# Patient Record
Sex: Male | Born: 1960
Health system: Southern US, Community
[De-identification: ages and names within clinical notes are randomized; demographics above are authoritative.]

## PROBLEM LIST (undated history)

## (undated) ENCOUNTER — Emergency Department (HOSPITAL_BASED_OUTPATIENT_CLINIC_OR_DEPARTMENT_OTHER): Admission: EM | Payer: Self-pay | Source: Home / Self Care

## (undated) DIAGNOSIS — K859 Acute pancreatitis without necrosis or infection, unspecified: Secondary | ICD-10-CM

## (undated) DIAGNOSIS — K219 Gastro-esophageal reflux disease without esophagitis: Secondary | ICD-10-CM

## (undated) DIAGNOSIS — I10 Essential (primary) hypertension: Secondary | ICD-10-CM

## (undated) DIAGNOSIS — Z973 Presence of spectacles and contact lenses: Secondary | ICD-10-CM

## (undated) DIAGNOSIS — C439 Malignant melanoma of skin, unspecified: Secondary | ICD-10-CM

## (undated) DIAGNOSIS — N2889 Other specified disorders of kidney and ureter: Secondary | ICD-10-CM

## (undated) DIAGNOSIS — N189 Chronic kidney disease, unspecified: Secondary | ICD-10-CM

## (undated) DIAGNOSIS — C649 Malignant neoplasm of unspecified kidney, except renal pelvis: Secondary | ICD-10-CM

## (undated) DIAGNOSIS — M1990 Unspecified osteoarthritis, unspecified site: Secondary | ICD-10-CM

## (undated) HISTORY — DX: Acute pancreatitis without necrosis or infection, unspecified: K85.90

## (undated) HISTORY — PX: COLONOSCOPY: SHX174

## (undated) HISTORY — PX: OTHER SURGICAL HISTORY: SHX169

---

## 1997-11-16 ENCOUNTER — Encounter: Payer: Self-pay | Admitting: Emergency Medicine

## 1997-11-16 ENCOUNTER — Emergency Department (HOSPITAL_COMMUNITY): Admission: EM | Admit: 1997-11-16 | Discharge: 1997-11-16 | Payer: Self-pay | Admitting: Emergency Medicine

## 1998-05-31 ENCOUNTER — Ambulatory Visit (HOSPITAL_COMMUNITY): Admission: RE | Admit: 1998-05-31 | Discharge: 1998-05-31 | Payer: Self-pay | Admitting: Internal Medicine

## 1998-05-31 ENCOUNTER — Encounter: Payer: Self-pay | Admitting: Internal Medicine

## 1998-11-06 ENCOUNTER — Ambulatory Visit (HOSPITAL_COMMUNITY): Admission: RE | Admit: 1998-11-06 | Discharge: 1998-11-06 | Payer: Self-pay | Admitting: Gastroenterology

## 2000-07-08 ENCOUNTER — Encounter: Payer: Self-pay | Admitting: Surgery

## 2000-07-08 ENCOUNTER — Ambulatory Visit (HOSPITAL_COMMUNITY): Admission: RE | Admit: 2000-07-08 | Discharge: 2000-07-08 | Payer: Self-pay | Admitting: Surgery

## 2000-07-15 ENCOUNTER — Encounter: Payer: Self-pay | Admitting: Surgery

## 2000-07-15 ENCOUNTER — Ambulatory Visit (HOSPITAL_BASED_OUTPATIENT_CLINIC_OR_DEPARTMENT_OTHER): Admission: RE | Admit: 2000-07-15 | Discharge: 2000-07-15 | Payer: Self-pay | Admitting: Surgery

## 2002-10-05 ENCOUNTER — Ambulatory Visit (HOSPITAL_COMMUNITY): Admission: RE | Admit: 2002-10-05 | Discharge: 2002-10-05 | Payer: Self-pay | Admitting: Internal Medicine

## 2002-10-05 ENCOUNTER — Encounter: Payer: Self-pay | Admitting: Internal Medicine

## 2003-12-25 ENCOUNTER — Emergency Department (HOSPITAL_COMMUNITY): Admission: EM | Admit: 2003-12-25 | Discharge: 2003-12-25 | Payer: Self-pay | Admitting: Emergency Medicine

## 2004-08-08 ENCOUNTER — Emergency Department (HOSPITAL_COMMUNITY): Admission: EM | Admit: 2004-08-08 | Discharge: 2004-08-08 | Payer: Self-pay | Admitting: Emergency Medicine

## 2011-02-21 ENCOUNTER — Emergency Department (INDEPENDENT_AMBULATORY_CARE_PROVIDER_SITE_OTHER)
Admission: EM | Admit: 2011-02-21 | Discharge: 2011-02-21 | Disposition: A | Discharge status: Home / Self Care | Payer: Self-pay | Source: Home / Self Care | Attending: Family Medicine | Admitting: Family Medicine

## 2011-02-21 ENCOUNTER — Emergency Department (INDEPENDENT_AMBULATORY_CARE_PROVIDER_SITE_OTHER): Payer: Self-pay

## 2011-02-21 DIAGNOSIS — M19041 Primary osteoarthritis, right hand: Secondary | ICD-10-CM

## 2011-02-21 DIAGNOSIS — M19049 Primary osteoarthritis, unspecified hand: Secondary | ICD-10-CM

## 2011-02-21 HISTORY — DX: Gastro-esophageal reflux disease without esophagitis: K21.9

## 2011-02-21 HISTORY — DX: Malignant melanoma of skin, unspecified: C43.9

## 2011-02-21 LAB — URIC ACID: Uric Acid, Serum: 7.6 mg/dL (ref 4.0–7.8)

## 2011-02-21 LAB — POCT I-STAT, CHEM 8
BUN: 5 mg/dL — ABNORMAL LOW (ref 6–23)
Calcium, Ion: 1.14 mmol/L (ref 1.12–1.32)
Chloride: 106 mEq/L (ref 96–112)
Creatinine, Ser: 0.8 mg/dL (ref 0.50–1.35)
Glucose, Bld: 131 mg/dL — ABNORMAL HIGH (ref 70–99)
HCT: 48 % (ref 39.0–52.0)
Hemoglobin: 16.3 g/dL (ref 13.0–17.0)
Potassium: 3.9 mEq/L (ref 3.5–5.1)
Sodium: 137 mEq/L (ref 135–145)
TCO2: 21 mmol/L (ref 0–100)

## 2011-02-21 LAB — CBC
HCT: 42.2 % (ref 39.0–52.0)
Hemoglobin: 15.2 g/dL (ref 13.0–17.0)
MCH: 32.3 pg (ref 26.0–34.0)
MCHC: 36 g/dL (ref 30.0–36.0)
MCV: 89.6 fL (ref 78.0–100.0)
Platelets: 202 10*3/uL (ref 150–400)
RBC: 4.71 MIL/uL (ref 4.22–5.81)
RDW: 12.6 % (ref 11.5–15.5)
WBC: 9.7 10*3/uL (ref 4.0–10.5)

## 2011-02-21 LAB — DIFFERENTIAL
Basophils Absolute: 0 10*3/uL (ref 0.0–0.1)
Basophils Relative: 0 % (ref 0–1)
Eosinophils Absolute: 0 10*3/uL (ref 0.0–0.7)
Eosinophils Relative: 0 % (ref 0–5)
Lymphocytes Relative: 14 % (ref 12–46)
Lymphs Abs: 1.3 10*3/uL (ref 0.7–4.0)
Monocytes Absolute: 1.1 10*3/uL — ABNORMAL HIGH (ref 0.1–1.0)
Monocytes Relative: 12 % (ref 3–12)
Neutro Abs: 7.2 10*3/uL (ref 1.7–7.7)
Neutrophils Relative %: 75 % (ref 43–77)

## 2011-02-21 MED ORDER — HYDROCODONE-ACETAMINOPHEN 5-325 MG PO TABS
1.0000 | ORAL_TABLET | Freq: Once | ORAL | Status: AC
  Administered 2011-02-21: 1 via ORAL

## 2011-02-21 MED ORDER — INDOMETHACIN 50 MG PO CAPS: 50.0000 mg | ORAL_CAPSULE | Freq: Two times a day (BID) | ORAL | Status: AC

## 2011-02-21 MED ORDER — HYDROCODONE-ACETAMINOPHEN 5-500 MG PO TABS: 1.0000 | ORAL_TABLET | Freq: Three times a day (TID) | ORAL | Status: AC | PRN

## 2011-02-21 MED ORDER — OMEPRAZOLE 20 MG PO CPDR: 20.0000 mg | DELAYED_RELEASE_CAPSULE | Freq: Every day | ORAL | Status: DC

## 2011-02-21 MED ORDER — PREDNISONE 20 MG PO TABS: ORAL_TABLET | ORAL | Status: AC

## 2011-02-21 MED ORDER — HYDROCODONE-ACETAMINOPHEN 5-325 MG PO TABS
ORAL_TABLET | ORAL | Status: AC
  Filled 2011-02-21: qty 1

## 2011-02-21 NOTE — ED Notes (Signed)
C/o pain and swelling to rt hand for 3 days.  Redness started yesterday.  Denies injury.

## 2011-02-21 NOTE — ED Provider Notes (Signed)
History     CSN: [id number]  Arrival date & time 02/21/11  1107   First MD Initiated Contact with Patient 02/21/11 1209      Chief Complaint  Patient presents with  . Hand Problem    (Consider location/radiation/quality/duration/timing/severity/associated sxs/prior treatment) HPI Comments: 50 y/o right handed male with h/o GERD here c/o constant right hand pain at dorsum of wrist and at base on 1st MP joint. Woke up 3 days ago with swelling, erythema and pain. Unable to move hand. 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 No discoloration of fingers, no coldness, weakness or numbness.   Past Medical History  Diagnosis Date  . GERD (gastroesophageal reflux disease)   . Melanoma     History reviewed. No pertinent past surgical history.  No family history on file.  History  Substance Use Topics  . Smoking status: Former Games developer  . Smokeless tobacco: Not on file  . Alcohol Use: Yes     social      Review of Systems  Constitutional: Negative.  Negative for fever and chills.  Musculoskeletal: Positive for joint swelling and arthralgias. Negative for myalgias.  Skin: Negative for wound.       Right wrist erythema and swelling at dorsum  Neurological: Negative for weakness and numbness.    Allergies  Sulfa antibiotics  Home Medications   Current Outpatient Rx  Name Route Sig Dispense Refill  . IBUPROFEN IB PO Oral Take by mouth as needed.      [name] PRILOSEC PO Oral Take by mouth daily.      [name] HYDROCODONE-ACETAMINOPHEN 5-500 MG PO TABS Oral Take 1-2 tablets by mouth every 8 (eight) hours as needed for pain. 10 tablet 0  . INDOMETHACIN 50 MG PO CAPS Oral Take 1 capsule (50 mg total) by mouth 2 (two) times daily with a meal. 14 capsule 0  . OMEPRAZOLE 20 MG PO CPDR Oral Take 1 capsule (20 mg total) by mouth daily. 20 capsule 0  . PREDNISONE 20 MG PO TABS  2 tabs po daily for 5 days 10 tablet no    BP 177/98  Pulse 94   Temp(Src) 99.7 F (37.6 C) (Oral)  Resp 18  SpO2 98%  Physical Exam  Nursing note and vitals reviewed. Constitutional: He is oriented to person, place, and time. He appears well-developed and well-nourished. No distress.  HENT:  Head: Normocephalic and atraumatic.  Cardiovascular: Normal heart sounds.   Pulmonary/Chest: Breath sounds normal.  Musculoskeletal:       Right hand: Mild erythema and swelling of wrist more in dorsum with diffuse tenderness to palpation. Passive and active limited flexion and extension of wrist due to reported pain.  Focal tenderness at 1st MP joint. Also enlarged tender 2nd and 3rd MP joints.  Patient able to adduct and abduct fingers against resistance but with reported discomfort can also make a  fist. Radial and ulnar pulses intact. Entire right extremity appear to have intact superficial sensation in right extremity including hand palm and dorsally. No distal cyanosis. No fluctuations, thickness of the skin or signs of infections, also no skin laceration or abrasions.  Neurological: He is alert and oriented to person, place, and time.  Skin: No rash noted.  Psychiatric:       Appears anxious, nail biting noted.    ED Course  Procedures (including critical care time)  Labs Reviewed  DIFFERENTIAL - Abnormal; Notable for the following:  Monocytes Absolute 1.1 (*)    All other components within normal limits  POCT I-STAT, CHEM 8 - Abnormal; Notable for the following:    BUN 5 (*)    Glucose, Bld 131 (*)    All other components within normal limits  CBC  URIC ACID  I-STAT, CHEM 8   Dg Hand Complete Right  02/21/2011  *RADIOLOGY REPORT*  Clinical Data: Pain and swelling  RIGHT HAND - COMPLETE 3+ VIEW  Comparison: None.  Findings: Three views of the right hand submitted.  No acute fracture or subluxation.  Degenerative narrowing noted radiocarpal joint.  Mild degenerative erosive changes tip of the ulnar styloid. Mild degenerative changes second  and third metacarpal phalangeal joint.  Dorsal spurring noted carpal region.  IMPRESSION:  No acute fracture or subluxation.  Degenerative narrowing noted radiocarpal joint.  Mild degenerative erosive changes tip of the ulnar styloid.  Mild degenerative changes second and third metacarpal phalangeal joint.  Dorsal spurring noted carpal region.  Original Report Authenticated By: Natasha Mead, M.D.     1. Degenerative arthritis of right hand       MDM  Non infectious DJD. Suspicious for gout. Symptomatic treatment.        Sharin Grave, MD 02/22/11 1323

## 2011-02-25 ENCOUNTER — Telehealth (HOSPITAL_COMMUNITY): Payer: Self-pay | Admitting: *Deleted

## 2011-09-21 ENCOUNTER — Encounter (HOSPITAL_COMMUNITY): Payer: Self-pay | Admitting: Emergency Medicine

## 2011-09-21 ENCOUNTER — Emergency Department (HOSPITAL_COMMUNITY)
Admission: EM | Admit: 2011-09-21 | Discharge: 2011-09-24 | Disposition: A | Discharge status: Home / Self Care | Payer: Self-pay | Attending: Emergency Medicine | Admitting: Emergency Medicine

## 2011-09-21 ENCOUNTER — Emergency Department (HOSPITAL_COMMUNITY): Payer: Self-pay

## 2011-09-21 DIAGNOSIS — F10929 Alcohol use, unspecified with intoxication, unspecified: Secondary | ICD-10-CM

## 2011-09-21 DIAGNOSIS — S61209A Unspecified open wound of unspecified finger without damage to nail, initial encounter: Secondary | ICD-10-CM | POA: Insufficient documentation

## 2011-09-21 DIAGNOSIS — F1010 Alcohol abuse, uncomplicated: Secondary | ICD-10-CM | POA: Insufficient documentation

## 2011-09-21 DIAGNOSIS — S61219A Laceration without foreign body of unspecified finger without damage to nail, initial encounter: Secondary | ICD-10-CM

## 2011-09-21 DIAGNOSIS — W268XXA Contact with other sharp object(s), not elsewhere classified, initial encounter: Secondary | ICD-10-CM | POA: Insufficient documentation

## 2011-09-21 DIAGNOSIS — M25539 Pain in unspecified wrist: Secondary | ICD-10-CM | POA: Insufficient documentation

## 2011-09-21 DIAGNOSIS — Z23 Encounter for immunization: Secondary | ICD-10-CM | POA: Insufficient documentation

## 2011-09-21 DIAGNOSIS — K219 Gastro-esophageal reflux disease without esophagitis: Secondary | ICD-10-CM | POA: Insufficient documentation

## 2011-09-21 DIAGNOSIS — M25531 Pain in right wrist: Secondary | ICD-10-CM

## 2011-09-21 LAB — COMPREHENSIVE METABOLIC PANEL
ALT: 88 U/L — ABNORMAL HIGH (ref 0–53)
AST: 152 U/L — ABNORMAL HIGH (ref 0–37)
Albumin: 3.9 g/dL (ref 3.5–5.2)
Alkaline Phosphatase: 65 U/L (ref 39–117)
BUN: 7 mg/dL (ref 6–23)
CO2: 22 mEq/L (ref 19–32)
Calcium: 9.1 mg/dL (ref 8.4–10.5)
Chloride: 97 mEq/L (ref 96–112)
Creatinine, Ser: 0.83 mg/dL (ref 0.50–1.35)
GFR calc Af Amer: >90 mL/min (ref >90)
GFR calc non Af Amer: >90 mL/min (ref >90)
Glucose, Bld: 102 mg/dL — ABNORMAL HIGH (ref 70–99)
Potassium: 3.3 mEq/L — ABNORMAL LOW (ref 3.5–5.1)
Sodium: 136 mEq/L (ref 135–145)
Total Bilirubin: 0.4 mg/dL (ref 0.3–1.2)
Total Protein: 7.8 g/dL (ref 6.0–8.3)

## 2011-09-21 LAB — CBC WITH DIFFERENTIAL/PLATELET
Basophils Absolute: 0 10*3/uL (ref 0.0–0.1)
Basophils Relative: 1 % (ref 0–1)
Eosinophils Absolute: 0 10*3/uL (ref 0.0–0.7)
Eosinophils Relative: 1 % (ref 0–5)
HCT: 37.5 % — ABNORMAL LOW (ref 39.0–52.0)
Hemoglobin: 13.1 g/dL (ref 13.0–17.0)
Lymphocytes Relative: 42 % (ref 12–46)
Lymphs Abs: 2.1 10*3/uL (ref 0.7–4.0)
MCH: 31.9 pg (ref 26.0–34.0)
MCHC: 34.9 g/dL (ref 30.0–36.0)
MCV: 91.2 fL (ref 78.0–100.0)
Monocytes Absolute: 0.5 10*3/uL (ref 0.1–1.0)
Monocytes Relative: 9 % (ref 3–12)
Neutro Abs: 2.4 10*3/uL (ref 1.7–7.7)
Neutrophils Relative %: 48 % (ref 43–77)
Platelets: 215 10*3/uL (ref 150–400)
RBC: 4.11 MIL/uL — ABNORMAL LOW (ref 4.22–5.81)
RDW: 12.7 % (ref 11.5–15.5)
WBC: 5 10*3/uL (ref 4.0–10.5)

## 2011-09-21 LAB — RAPID URINE DRUG SCREEN, HOSP PERFORMED
Amphetamines: NOT DETECTED
Barbiturates: NOT DETECTED
Benzodiazepines: NOT DETECTED
Cocaine: POSITIVE — AB
Opiates: NOT DETECTED
Tetrahydrocannabinol: NOT DETECTED

## 2011-09-21 LAB — ETHANOL: Alcohol, Ethyl (B): 280 mg/dL — ABNORMAL HIGH (ref 0–11)

## 2011-09-21 MED ORDER — TETANUS-DIPHTH-ACELL PERTUSSIS 5-2.5-18.5 LF-MCG/0.5 IM SUSP
0.5000 mL | Freq: Once | INTRAMUSCULAR | Status: AC
  Administered 2011-09-21: 0.5 mL via INTRAMUSCULAR
  Filled 2011-09-21: qty 0.5

## 2011-09-21 NOTE — ED Notes (Signed)
Pt's sister taking all his cloths and belongings home.Pt is in paper scrubs.

## 2011-09-21 NOTE — ED Notes (Signed)
Security called for wander.

## 2011-09-21 NOTE — ED Notes (Signed)
Pt wanting inpatient detox for ETOH abuse; pt reports drinking 24 beers per day; denies SI or HI

## 2011-09-21 NOTE — ED Notes (Signed)
Pt presented to ED by himself for injury and has been intoxicated.He has voluntary admitted himself for detox.

## 2011-09-21 NOTE — ED Notes (Signed)
Report given to Angel.

## 2011-09-21 NOTE — ED Notes (Signed)
Patient cut middle finger on left hand blade at work.  Bleeding controlled.

## 2011-09-21 NOTE — ED Notes (Signed)
Pt searched by security  

## 2011-09-21 NOTE — ED Provider Notes (Signed)
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 Reports persistent bleeding despite pressure and elevation. Denies numbness, decreased range of motion, or significant pain. Reports it has a significant amount of alcohol to drink. Patient requesting referrals for alcohol detox. Patient also requests repeat x-ray of his right hand. States last time he was here he was supposed to followup with an orthopedist but was unable to do to financial situation. States he was told that he had some bone softening. And needed to have a repeat x-ray in 3 months. Patient states he is currently still wearing his thumb spica splint.  Patient is a 51 y.o. male presenting with skin laceration. The history is provided by the patient.  Laceration  The incident occurred 3 to 5 hours ago. The laceration is located on the left hand. The laceration is 2 cm in size. The laceration mechanism was a a metal edge. The patient is experiencing no pain. The pain has been constant since onset. He reports no foreign bodies present. His tetanus status is unknown.    Past Medical History  Diagnosis Date  . GERD (gastroesophageal reflux disease)   . Melanoma     History reviewed. No pertinent past surgical history.  No family history on file.  History  Substance Use Topics  . Smoking status: Former Games developer  . Smokeless tobacco: Not on file  . Alcohol Use: Yes     social      Review of Systems  Constitutional: Negative for fever.  Skin: Positive for wound (laceration). Negative for color change.  Neurological: Negative for weakness and numbness.  All other systems reviewed and are negative.    Allergies  Sulfa antibiotics  Home Medications   Current Outpatient Rx  Name Route Sig Dispense Refill  . IBUPROFEN 200 MG PO TABS Oral Take 1,000 mg by  mouth 2 (two) times daily as needed. For pain    . OMEPRAZOLE 20 MG PO CPDR Oral Take 20 mg by mouth daily.      BP 169/95  Pulse 98  Temp 98 F (36.7 C) (Oral)  Resp 16  SpO2 97%  Physical Exam  Vitals reviewed. Constitutional: He is oriented to person, place, and time. He appears well-developed and well-nourished.  HENT:  Head: Normocephalic and atraumatic.  Eyes: Pupils are equal, round, and reactive to light.  Musculoskeletal:       Left third distal phalanx has a 2 cm laceration on the palmar surface. Laceration appears to be deeply explored with forceps. No obvious bleeding vessels. No obvious tendon lacerations. Wound thoroughly irrigated. Normal tendon function.  Right wrist full range of motion minimal tenderness on the ulnar surface. Normal capillary refill.  Neurological: He is alert and oriented to person, place, and time.  Skin: Skin is warm and dry. No rash noted. No erythema. No pallor.  Psychiatric: He has a normal mood and affect. His behavior is normal.    ED Course  Procedures  LACERATION REPAIR Performed by: Thomasene Lot Authorized by: Thomasene Lot Consent: Verbal consent obtained. Risks and benefits: risks, benefits and alternatives were discussed Consent given by: patient Patient identity confirmed: provided demographic data Prepped and Draped in normal sterile fashion Wound explored  Laceration Location: Left middle finger  Laceration Length: 2cm  No Foreign Bodies seen  or palpated  Anesthesia: digital nerve block   Local anesthetic: lidocaine 2% with epinephrine  Anesthetic total: 3 ml  Irrigation method: syringe Amount of cleaning: standard  Skin closure: Suture  Number of sutures: 5  Technique: Simple interrupted with 5-0 Prolene  Patient tolerance: Patient tolerated the procedure well with no immediate complications.   Results for orders placed during the hospital encounter of 09/21/11  CBC WITH DIFFERENTIAL       Component Value Range   WBC 5.0  4.0 - 10.5 K/uL   RBC 4.11 (*) 4.22 - 5.81 MIL/uL   Hemoglobin 13.1  13.0 - 17.0 g/dL   HCT 16.1 (*) 09.6 - 04.5 %   MCV 91.2  78.0 - 100.0 fL   MCH 31.9  26.0 - 34.0 pg   MCHC 34.9  30.0 - 36.0 g/dL   RDW 40.9  81.1 - 91.4 %   Platelets 215  150 - 400 K/uL   Neutrophils Relative 48  43 - 77 %   Neutro Abs 2.4  1.7 - 7.7 K/uL   Lymphocytes Relative 42  12 - 46 %   Lymphs Abs 2.1  0.7 - 4.0 K/uL   Monocytes Relative 9  3 - 12 %   Monocytes Absolute 0.5  0.1 - 1.0 K/uL   Eosinophils Relative 1  0 - 5 %   Eosinophils Absolute 0.0  0.0 - 0.7 K/uL   Basophils Relative 1  0 - 1 %   Basophils Absolute 0.0  0.0 - 0.1 K/uL  COMPREHENSIVE METABOLIC PANEL      Component Value Range   Sodium 136  135 - 145 mEq/L   Potassium 3.3 (*) 3.5 - 5.1 mEq/L   Chloride 97  96 - 112 mEq/L   CO2 22  19 - 32 mEq/L   Glucose, Bld 102 (*) 70 - 99 mg/dL   BUN 7  6 - 23 mg/dL   Creatinine, Ser 7.82  0.50 - 1.35 mg/dL   Calcium 9.1  8.4 - 95.6 mg/dL   Total Protein 7.8  6.0 - 8.3 g/dL   Albumin 3.9  3.5 - 5.2 g/dL   AST 213 (*) 0 - 37 U/L   ALT 88 (*) 0 - 53 U/L   Alkaline Phosphatase 65  39 - 117 U/L   Total Bilirubin 0.4  0.3 - 1.2 mg/dL   GFR calc non Af Amer >90  >90 mL/min   GFR calc Af Amer >90  >90 mL/min  ETHANOL      Component Value Range   Alcohol, Ethyl (B) 280 (*) 0 - 11 mg/dL  URINE RAPID DRUG SCREEN (HOSP PERFORMED)      Component Value Range   Opiates NONE DETECTED  NONE DETECTED   Cocaine POSITIVE (*) NONE DETECTED   Benzodiazepines NONE DETECTED  NONE DETECTED   Amphetamines NONE DETECTED  NONE DETECTED   Tetrahydrocannabinol NONE DETECTED  NONE DETECTED   Barbiturates NONE DETECTED  NONE DETECTED   Dg Hand Complete Right  09/21/2011  *RADIOLOGY REPORT*  Clinical Data: Right hand pain  RIGHT HAND - COMPLETE 3+ VIEW  Comparison: 02/21/2011  Findings: Advanced radiocarpal degenerative changes have progressed some in the interval.  Irregularity to  the distal ulna, with erosive changes and increased heterotopic bone along the ulnar margin.  Degenerative changes of the second and third metacarpal phalangeal joints are similar to prior. Mild radial subluxation at the third metacarpal phalangeal, similar to prior.  No acute fracture or dislocation identified. Associated  soft tissue swelling along the dorsal aspect of the wrist.  IMPRESSION: Interval progression of the radiocarpal degenerative changes and erosive changes of the distal ulna. Arthritis, including rheumatoid should be considered.  Infection not excluded in the appropriate clinical setting.  Original Report Authenticated By: Waneta Martins, M.D.   Dg Finger Middle Left  09/21/2011  *RADIOLOGY REPORT*  Clinical Data: Left middle finger laceration.  LEFT MIDDLE FINGER 2+V  Comparison: None.  Findings: No fracture or dislocation. Mild DIP degenerative changes.  No definite radiopaque foreign body. Tiny round calcific density along the volar surface of the middle phalanx. No definite radiopaque foreign body.  IMPRESSION: No acute osseous abnormality.  Tiny round calcific density along the volar surface of the middle phalanx is of doubtful clinical significance. However, correlate with the site of laceration.  No definite radiopaque foreign body.  Original Report Authenticated By: Waneta Martins, M.D.      MDM    Patient now reports he would like to try inpatient detox. Will order screening labs and move to pod C. discussed worsening or changes in right hand. Advised close followup with hand patient currently in a splint.    Activity the patient. However patient is sleepy and so intoxicated. States he'll see patient in the morning. Discussed this with patient he voices understanding.     Thomasene Lot, PA-C 09/22/11 [phone]

## 2011-09-22 LAB — ETHANOL: Alcohol, Ethyl (B): <11 mg/dL (ref 0–11)

## 2011-09-22 MED ORDER — VITAMIN B-1 100 MG PO TABS
100.0000 mg | ORAL_TABLET | Freq: Every day | ORAL | Status: DC
  Administered 2011-09-22 – 2011-09-24 (×3): 100 mg via ORAL
  Filled 2011-09-22 (×3): qty 1

## 2011-09-22 MED ORDER — THIAMINE HCL 100 MG/ML IJ SOLN: 100.0000 mg | Freq: Every day | INTRAMUSCULAR | Status: DC

## 2011-09-22 MED ORDER — LORAZEPAM 1 MG PO TABS
1.0000 mg | ORAL_TABLET | Freq: Three times a day (TID) | ORAL | Status: DC | PRN
  Administered 2011-09-22 – 2011-09-23 (×3): 1 mg via ORAL
  Filled 2011-09-22: qty 1

## 2011-09-22 MED ORDER — ADULT MULTIVITAMIN W/MINERALS CH
1.0000 | ORAL_TABLET | Freq: Every day | ORAL | Status: DC
  Administered 2011-09-22 – 2011-09-24 (×3): 1 via ORAL
  Filled 2011-09-22 (×3): qty 1

## 2011-09-22 MED ORDER — LORAZEPAM 2 MG/ML IJ SOLN: 1.0000 mg | Freq: Four times a day (QID) | INTRAMUSCULAR | Status: DC | PRN

## 2011-09-22 MED ORDER — LORAZEPAM 1 MG PO TABS
1.0000 mg | ORAL_TABLET | Freq: Four times a day (QID) | ORAL | Status: DC | PRN
  Administered 2011-09-23 – 2011-09-24 (×2): 1 mg via ORAL
  Filled 2011-09-22 (×3): qty 1

## 2011-09-22 MED ORDER — ACETAMINOPHEN 325 MG PO TABS
650.0000 mg | ORAL_TABLET | ORAL | Status: DC | PRN
  Administered 2011-09-22 – 2011-09-24 (×5): 650 mg via ORAL
  Filled 2011-09-22 (×5): qty 2

## 2011-09-22 MED ORDER — PANTOPRAZOLE SODIUM 40 MG PO TBEC
40.0000 mg | DELAYED_RELEASE_TABLET | Freq: Two times a day (BID) | ORAL | Status: DC
  Administered 2011-09-22 – 2011-09-24 (×5): 40 mg via ORAL
  Filled 2011-09-22 (×5): qty 1

## 2011-09-22 MED ORDER — LORAZEPAM 1 MG PO TABS
0.0000 mg | ORAL_TABLET | Freq: Four times a day (QID) | ORAL | Status: AC
  Administered 2011-09-22: 1 mg via ORAL
  Administered 2011-09-22: 2 mg via ORAL
  Administered 2011-09-23: 1 mg via ORAL
  Filled 2011-09-22 (×3): qty 1
  Filled 2011-09-22: qty 2
  Filled 2011-09-22: qty 4

## 2011-09-22 MED ORDER — ALUM & MAG HYDROXIDE-SIMETH 200-200-20 MG/5ML PO SUSP
30.0000 mL | ORAL | Status: DC | PRN
  Administered 2011-09-22: 30 mL via ORAL
  Filled 2011-09-22: qty 30

## 2011-09-22 MED ORDER — LORAZEPAM 1 MG PO TABS
0.0000 mg | ORAL_TABLET | Freq: Two times a day (BID) | ORAL | Status: DC
  Administered 2011-09-24: 1 mg via ORAL

## 2011-09-22 MED ORDER — ZOLPIDEM TARTRATE 5 MG PO TABS
5.0000 mg | ORAL_TABLET | Freq: Every evening | ORAL | Status: DC | PRN
  Administered 2011-09-23: 5 mg via ORAL
  Filled 2011-09-22: qty 1

## 2011-09-22 MED ORDER — ONDANSETRON HCL 8 MG PO TABS: 4.0000 mg | ORAL_TABLET | Freq: Three times a day (TID) | ORAL | Status: DC | PRN

## 2011-09-22 MED ORDER — FOLIC ACID 1 MG PO TABS
1.0000 mg | ORAL_TABLET | Freq: Every day | ORAL | Status: DC
  Administered 2011-09-22 – 2011-09-24 (×3): 1 mg via ORAL
  Filled 2011-09-22 (×4): qty 1

## 2011-09-22 MED ORDER — IBUPROFEN 200 MG PO TABS
600.0000 mg | ORAL_TABLET | Freq: Three times a day (TID) | ORAL | Status: DC | PRN
  Administered 2011-09-22 – 2011-09-24 (×4): 600 mg via ORAL
  Filled 2011-09-22 (×2): qty 1
  Filled 2011-09-22: qty 3
  Filled 2011-09-22: qty 1

## 2011-09-22 NOTE — ED Notes (Signed)
Richardo Hanks (sister) -- [phone]

## 2011-09-22 NOTE — ED Notes (Signed)
Dinner tray given

## 2011-09-22 NOTE — ED Notes (Signed)
Spoke with physician re: plan of care.

## 2011-09-22 NOTE — ED Notes (Signed)
Dinner tray ordered.

## 2011-09-22 NOTE — ED Notes (Signed)
Left middle finger cleaned with NS and antibiotic ointment applied

## 2011-09-22 NOTE — BH Assessment (Signed)
Assessment Note   James [name] is an 51 y.o. male.  Pt came to [facility] last night seeking detox from ETOH.  Reports drinking 18-24 beers daily over the last 6 months at least.  Last use was yesterday (07/27) and was 18-20 beers.  Pt also admitted to some use of marijuana and cocaine.  He says that he rarely uses cocaine although this must be one of those incidents since his UDS is positive for it.  Marijuana use is only when friends have it available.  Patient denies any current SI, HI or A/V hallucinations.  He reports no past history of inpatient or outpatient services.  Detox request will be sent to both [facility] and [facility] for placement consideration. Axis I: 303.90 ETOH depdendence Axis II: Deferred Axis III:  Past Medical History  Diagnosis Date  . GERD (gastroesophageal reflux disease)   . Melanoma    Axis IV: economic problems, housing problems, other psychosocial or environmental problems and problems related to legal system/crime Axis V: 31-40 impairment in reality testing  Past Medical History:  Past Medical History  Diagnosis Date  . GERD (gastroesophageal reflux disease)   . Melanoma     History reviewed. No pertinent past surgical history.  Family History: No family history on file.  Social History:  reports that he has quit smoking. He does not have any smokeless tobacco history on file. He reports that he drinks alcohol. He reports that he does not use illicit drugs.  Additional Social History:  Alcohol / Drug Use Pain Medications: None  Prescriptions: None Over the Counter: Prilosec 40 mg once daily; Ibuprophen up to 800mg  per day to address arthritis in right wrist History of alcohol / drug use?: Yes Longest period of sobriety (when/how long): Reports 8 months sobriety Negative Consequences of Use: Personal relationships Withdrawal Symptoms: Cramps;Diarrhea;Fever / Chills;Weakness;Tremors;Sweats;Nausea / Vomiting;Patient aware of relationship between substance abuse  and physical/medical complications Substance #1 Name of Substance 1: Beer 1 - Age of First Use: 51 years of age 65 - Amount (size/oz): 18-24 beers 1 - Frequency: Daily use 1 - Duration: Last 6 months at least 1 - Last Use / Amount: 07/27 drank 18-20 beers Substance #2 Name of Substance 2: Marijuana 2 - Age of First Use: 51 years of age 83 - Amount (size/oz): Amount varies according to what friends have available.  Reports not going out to seek it. 2 - Frequency: Varies according to when friends have it 2 - Duration: On-going 2 - Last Use / Amount: 07/26.  "I took a few puffs." Substance #3 Name of Substance 3: Cocaine 3 - Age of First Use: 51 years old 3 - Amount (size/oz): "I rarely use it." 3 - Frequency: May use if friends have it. 3 - Duration: On-going 3 - Last Use / Amount: Cannot recall  CIWA: CIWA-Ar BP: 150/95 mmHg Pulse Rate: 82  Nausea and Vomiting: no nausea and no vomiting Tactile Disturbances: none Tremor: two Auditory Disturbances: not present Paroxysmal Sweats: two Visual Disturbances: mild sensitivity Anxiety: mildly anxious Headache, Fullness in Head: none present Agitation: somewhat more than normal activity Orientation and Clouding of Sensorium: oriented and can do serial additions CIWA-Ar Total: 8  COWS:    Allergies:  Allergies  Allergen Reactions  . Sulfa Antibiotics     Reaction unknown    Home Medications:  (Not in a hospital admission)  OB/GYN Status:  No LMP for male patient.  General Assessment Data Location of Assessment: [facility] ED ACT Assessment: Yes  Living Arrangements: Parent Can pt return to current living arrangement?: Yes Admission Status: Voluntary Is patient capable of signing voluntary admission?: Yes Transfer from: Acute Hospital Referral Source: Self/Family/Friend  Education Status Highest grade of school patient has completed: 12th grade  Risk to self Suicidal Ideation: No Suicidal Intent: No Is patient at risk for  suicide?: No Suicidal Plan?: No Access to Means: No What has been your use of drugs/alcohol within the last 12 months?: Daily use of beer  Previous Attempts/Gestures: No How many times?: 0  Other Self Harm Risks: SA issues Triggers for Past Attempts: None known Intentional Self Injurious Behavior: None Family Suicide History: No Recent stressful life event(s): Conflict (Comment) (Broke up with girlfriend on Memorial Day) Persecutory voices/beliefs?: No Depression: Yes Depression Symptoms: Despondent;Feeling worthless/self pity;Insomnia Substance abuse history and/or treatment for substance abuse?: Yes Suicide prevention information given to non-admitted patients: Not applicable  Risk to Others Homicidal Ideation: No Thoughts of Harm to Others: No Current Homicidal Intent: No Current Homicidal Plan: No Access to Homicidal Means: No Identified Victim: No one History of harm to others?: No Assessment of Violence: None Noted Violent Behavior Description: Pt calm, drowsy and cooperative Does patient have access to weapons?: No Criminal Charges Pending?: Yes Describe Pending Criminal Charges: Harassing phone calls to former girlfriend Does patient have a court date: Yes Court Date: 10/02/11  Psychosis Hallucinations: None noted Delusions: None noted  Mental Status Report Appear/Hygiene:  (Casual) Eye Contact: Fair Motor Activity: Freedom of movement;Restlessness (Was shaking leg early in assessment) Speech: Logical/coherent;Soft Level of Consciousness: Quiet/awake;Drowsy Mood: Depressed Affect: Depressed Anxiety Level: None Thought Processes: Coherent;Relevant Judgement: Impaired Orientation: Person;Place;Time;Situation Obsessive Compulsive Thoughts/Behaviors: Minimal  Cognitive Functioning Concentration: Normal Memory: Recent Intact;Remote Intact IQ: Average Insight: Fair Impulse Control: Poor Appetite: Fair Weight Loss: 0  Weight Gain: 0  Sleep:  Decreased Total Hours of Sleep:  (<4H/D) Vegetative Symptoms: None  ADLScreening [facility] Assessment Services) Patient's cognitive ability adequate to safely complete daily activities?: Yes Patient able to express need for assistance with ADLs?: Yes Independently performs ADLs?: Yes  Abuse/Neglect [facility]) Physical Abuse: Denies Verbal Abuse: Denies Sexual Abuse: Denies  Prior Inpatient Therapy Prior Inpatient Therapy: No Prior Therapy Dates: Denies previous history Prior Therapy Facilty/Provider(s): Denies previous history Reason for Treatment: N/A  Prior Outpatient Therapy Prior Outpatient Therapy: No Prior Therapy Dates: Denies previous history' Prior Therapy Facilty/Provider(s): Denies previous hisotry Reason for Treatment: N/A  ADL Screening (condition at time of admission) Patient's cognitive ability adequate to safely complete daily activities?: Yes Patient able to express need for assistance with ADLs?: Yes Independently performs ADLs?: Yes Weakness of Legs: None Weakness of Arms/Hands: Right (Pain in right wrist due to arthritis)  Home Assistive Devices/Equipment Home Assistive Devices/Equipment: None    Abuse/Neglect Assessment (Assessment to be complete while patient is alone) Physical Abuse: Denies Verbal Abuse: Denies Sexual Abuse: Denies Exploitation of patient/patient's resources: Denies Self-Neglect: Denies Values / Beliefs Cultural Requests During Hospitalization: None Spiritual Requests During Hospitalization: None   Advance Directives (For Healthcare) Advance Directive: Patient does not have advance directive;Patient would not like information    Additional Information 1:1 In Past 12 Months?: No CIRT Risk: No Elopement Risk: No Does patient have medical clearance?: Yes     Disposition:  Disposition Disposition of Patient: Inpatient treatment program;Referred to Type of inpatient treatment program: Adult Patient referred to: [facility]  On Site  Evaluation by:   Reviewed with Physician:  Dr. Alesia Morin, Berna Spare Ray 09/22/2011 1:15 PM

## 2011-09-22 NOTE — ED Notes (Signed)
ACT team counselor unable to perform assessment due to patient being asleep at time of visit. Plans are for her to return later this morning to complete assessment.

## 2011-09-23 NOTE — BH Assessment (Signed)
[facility] Assessment Progress Note      Called [facility] to follow up with pt referral and per [facility] will not accept for detox as pt has been here for 2 days, but [facility] will consider for treatment in AM once BP stable per Olegario Messier at 1455 as well as Patsy @ 1543.  EDP Ignacia Palma agreed to let pt stay overnight to continue detox protocol in ED.  Oncoming staff to follow up in AM.

## 2011-09-23 NOTE — ED Notes (Signed)
Patient requests to have a Child psychotherapist see him regarding the possibility of getting Medicaid or some "kind of insurance".

## 2011-09-23 NOTE — ED Notes (Signed)
Pt c/o pain to rgt wrist which he reports is chronic; tylenol adm as ordered; also endorses pain to substernal chest rad into rgt side of chest; states pain started after discussing disposition with ACT team member; currently rates chest pain 6-7/10; repeat EKG done per EDT

## 2011-09-23 NOTE — ED Notes (Signed)
Patient asked when he could take a shower.  Instructed that he could whenever he wanted to just to let us know

## 2011-09-23 NOTE — ED Notes (Signed)
Pt resting without complaint

## 2011-09-23 NOTE — ED Notes (Signed)
Resting quietly on stretcher watching TV; states chest pain has dissipated; pt aware of details of current plan of care and is currently in agreement

## 2011-09-23 NOTE — ED Provider Notes (Signed)
Medical screening examination/treatment/procedure(s) were performed by non-physician practitioner and as supervising physician I was immediately available for consultation/collaboration.   Joya Gaskins, MD 09/23/11 [phone]

## 2011-09-23 NOTE — ED Notes (Signed)
James [name] called pt ok to tell he is in the er. Pt requested visit for tomorrow.

## 2011-09-23 NOTE — ED Notes (Signed)
CSW f/u with Pt to discuss medicaid application process. Pt referred to the community liaison for orange card application and information.   Frederico Hamman, LCSW  [phone]

## 2011-09-24 NOTE — BH Assessment (Addendum)
Assessment Note  Update:  Called Kathy @ [facility] to follow up with treatment referral for tomorrow.  Completed pre-screen with Olegario Messier over phone.  However, pt stated he does not want to go to [facility], that he wants to stay in [place] for treatment because his parents are here and are both sick.  Called [facility] per pt request and made an intake appt for 09/26/11 @ 8 AM, spoke with Mearl Latin @ 1115.  Pt was given appointment and the residential program and requirements were explained to him.  Pt has detoxed while in ED.  Pt was also given the walk-in hours for [facility] in [facility] from 10:30 - 2 for a walk-in assessment with Fancina at [facility] in case he did not want to attend the [facility] program.  Consulted with EDP Pl[place] who was in agreement with pt being discharged and given appt.  Updated assessment disposition, completed assessment notification and faxed to [facility] to log.  Updated ED staff.     Disposition:  Disposition Disposition of Patient: Referred to;Outpatient treatment Type of inpatient treatment program: Adult Type of outpatient treatment: Adult Patient referred to: Other (Comment) (Pt given referral to [facility] for residential SA treatment)  On Site Evaluation by:   Reviewed with Physician:  [place] Callas, Rennis Harding 09/24/2011 11:22 AM
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
## 2011-10-14 ENCOUNTER — Encounter (HOSPITAL_COMMUNITY): Payer: Self-pay | Admitting: *Deleted

## 2011-10-14 ENCOUNTER — Emergency Department (HOSPITAL_COMMUNITY)
Admission: EM | Admit: 2011-10-14 | Discharge: 2011-10-15 | Disposition: A | Discharge status: Home / Self Care | Payer: Self-pay | Attending: Emergency Medicine | Admitting: Emergency Medicine

## 2011-10-14 DIAGNOSIS — W010XXA Fall on same level from slipping, tripping and stumbling without subsequent striking against object, initial encounter: Secondary | ICD-10-CM | POA: Insufficient documentation

## 2011-10-14 DIAGNOSIS — M25569 Pain in unspecified knee: Secondary | ICD-10-CM | POA: Insufficient documentation

## 2011-10-14 DIAGNOSIS — M2540 Effusion, unspecified joint: Secondary | ICD-10-CM | POA: Insufficient documentation

## 2011-10-14 DIAGNOSIS — M62838 Other muscle spasm: Secondary | ICD-10-CM | POA: Insufficient documentation

## 2011-10-14 DIAGNOSIS — M25579 Pain in unspecified ankle and joints of unspecified foot: Secondary | ICD-10-CM | POA: Insufficient documentation

## 2011-10-14 DIAGNOSIS — M47812 Spondylosis without myelopathy or radiculopathy, cervical region: Secondary | ICD-10-CM | POA: Insufficient documentation

## 2011-10-14 DIAGNOSIS — M25469 Effusion, unspecified knee: Secondary | ICD-10-CM | POA: Insufficient documentation

## 2011-10-14 DIAGNOSIS — Y9389 Activity, other specified: Secondary | ICD-10-CM | POA: Insufficient documentation

## 2011-10-14 DIAGNOSIS — W19XXXA Unspecified fall, initial encounter: Secondary | ICD-10-CM

## 2011-10-14 DIAGNOSIS — M436 Torticollis: Secondary | ICD-10-CM | POA: Insufficient documentation

## 2011-10-14 DIAGNOSIS — M25539 Pain in unspecified wrist: Secondary | ICD-10-CM | POA: Insufficient documentation

## 2011-10-14 DIAGNOSIS — M542 Cervicalgia: Secondary | ICD-10-CM | POA: Insufficient documentation

## 2011-10-14 DIAGNOSIS — Y9210 Unspecified residential institution as the place of occurrence of the external cause: Secondary | ICD-10-CM | POA: Insufficient documentation
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
## 2011-10-15 ENCOUNTER — Emergency Department (HOSPITAL_COMMUNITY): Payer: Self-pay

## 2011-10-15 LAB — BASIC METABOLIC PANEL
BUN: 12 mg/dL (ref 6–23)
CO2: 25 mEq/L (ref 19–32)
Calcium: 9.5 mg/dL (ref 8.4–10.5)
Chloride: 96 mEq/L (ref 96–112)
Creatinine, Ser: 0.73 mg/dL (ref 0.50–1.35)
GFR calc Af Amer: >90 mL/min (ref >90)
GFR calc non Af Amer: >90 mL/min (ref >90)
Glucose, Bld: 130 mg/dL — ABNORMAL HIGH (ref 70–99)
Potassium: 3.3 mEq/L — ABNORMAL LOW (ref 3.5–5.1)
Sodium: 132 mEq/L — ABNORMAL LOW (ref 135–145)

## 2011-10-15 LAB — CBC WITH DIFFERENTIAL/PLATELET
Basophils Absolute: 0.1 10*3/uL (ref 0.0–0.1)
Basophils Relative: 1 % (ref 0–1)
Eosinophils Absolute: 0 10*3/uL (ref 0.0–0.7)
Eosinophils Relative: 0 % (ref 0–5)
HCT: 37.1 % — ABNORMAL LOW (ref 39.0–52.0)
Hemoglobin: 12.7 g/dL — ABNORMAL LOW (ref 13.0–17.0)
Lymphocytes Relative: 15 % (ref 12–46)
Lymphs Abs: 1.1 10*3/uL (ref 0.7–4.0)
MCH: 31.3 pg (ref 26.0–34.0)
MCHC: 34.2 g/dL (ref 30.0–36.0)
MCV: 91.4 fL (ref 78.0–100.0)
Monocytes Absolute: 1 10*3/uL (ref 0.1–1.0)
Monocytes Relative: 14 % — ABNORMAL HIGH (ref 3–12)
Neutro Abs: 5.2 10*3/uL (ref 1.7–7.7)
Neutrophils Relative %: 70 % (ref 43–77)
Platelets: 341 10*3/uL (ref 150–400)
RBC: 4.06 MIL/uL — ABNORMAL LOW (ref 4.22–5.81)
RDW: 12.5 % (ref 11.5–15.5)
WBC: 7.4 10*3/uL (ref 4.0–10.5)

## 2011-10-15 MED ORDER — METHOCARBAMOL 500 MG PO TABS: 500.0000 mg | ORAL_TABLET | Freq: Two times a day (BID) | ORAL | Status: AC

## 2011-10-15 MED ORDER — HYDROCODONE-ACETAMINOPHEN 5-325 MG PO TABS: 2.0000 | ORAL_TABLET | Freq: Four times a day (QID) | ORAL | Status: AC | PRN

## 2011-10-15 MED ORDER — OXYCODONE-ACETAMINOPHEN 5-325 MG PO TABS
2.0000 | ORAL_TABLET | Freq: Once | ORAL | Status: AC
  Administered 2011-10-15: 2 via ORAL

## 2011-10-15 MED ORDER — OXYCODONE-ACETAMINOPHEN 5-325 MG PO TABS
ORAL_TABLET | ORAL | Status: AC
  Filled 2011-10-15: qty 2

## 2011-10-15 NOTE — ED Notes (Signed)
Discharge instructions reviewed w/ pt., verbalizes understanding. Two prescriptions provided at discharge. 

## 2011-10-15 NOTE — ED Notes (Signed)
Pt states has fallen 3 times in last week. Started while pt was incarcerated, given pallet on floor to sleep on which added to physical discomfort of arthritis and ability to ambulate independently. Pt indicates had to use right foot to push left leg in order to get self up off of floor.
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
## 2011-10-17 NOTE — ED Provider Notes (Signed)
Medical screening examination/treatment/procedure(s) were performed by non-physician practitioner and as supervising physician I was immediately available for consultation/collaboration.  Olivia Mackie, MD 10/17/11 [id number]

## 2012-05-24 ENCOUNTER — Inpatient Hospital Stay (HOSPITAL_COMMUNITY)
Admission: AD | Admit: 2012-05-24 | Discharge: 2012-05-28 | DRG: 885 | Disposition: A | Discharge status: Home / Self Care | Payer: No Typology Code available for payment source | Source: Intra-hospital | Attending: Psychiatry | Admitting: Psychiatry

## 2012-05-24 ENCOUNTER — Emergency Department (HOSPITAL_COMMUNITY): Payer: Self-pay

## 2012-05-24 ENCOUNTER — Encounter (HOSPITAL_COMMUNITY): Payer: Self-pay | Admitting: *Deleted

## 2012-05-24 ENCOUNTER — Encounter (HOSPITAL_COMMUNITY): Payer: Self-pay

## 2012-05-24 ENCOUNTER — Emergency Department (HOSPITAL_COMMUNITY)
Admission: EM | Admit: 2012-05-24 | Discharge: 2012-05-24 | Disposition: A | Discharge status: Other Acute Inpatient Hospital | Payer: Self-pay | Attending: Emergency Medicine | Admitting: Emergency Medicine

## 2012-05-24 DIAGNOSIS — F332 Major depressive disorder, recurrent severe without psychotic features: Principal | ICD-10-CM | POA: Diagnosis present

## 2012-05-24 DIAGNOSIS — Z85820 Personal history of malignant melanoma of skin: Secondary | ICD-10-CM | POA: Insufficient documentation

## 2012-05-24 DIAGNOSIS — Z79899 Other long term (current) drug therapy: Secondary | ICD-10-CM

## 2012-05-24 DIAGNOSIS — F29 Unspecified psychosis not due to a substance or known physiological condition: Secondary | ICD-10-CM | POA: Insufficient documentation

## 2012-05-24 DIAGNOSIS — F1010 Alcohol abuse, uncomplicated: Secondary | ICD-10-CM | POA: Insufficient documentation

## 2012-05-24 DIAGNOSIS — F1020 Alcohol dependence, uncomplicated: Secondary | ICD-10-CM | POA: Diagnosis present

## 2012-05-24 DIAGNOSIS — Z87891 Personal history of nicotine dependence: Secondary | ICD-10-CM | POA: Insufficient documentation

## 2012-05-24 DIAGNOSIS — K2970 Gastritis, unspecified, without bleeding: Secondary | ICD-10-CM | POA: Insufficient documentation

## 2012-05-24 DIAGNOSIS — Z8719 Personal history of other diseases of the digestive system: Secondary | ICD-10-CM | POA: Insufficient documentation

## 2012-05-24 LAB — CBC WITH DIFFERENTIAL/PLATELET
Basophils Absolute: 0 10*3/uL (ref 0.0–0.1)
Basophils Relative: 1 % (ref 0–1)
Eosinophils Absolute: 0 10*3/uL (ref 0.0–0.7)
Eosinophils Relative: 1 % (ref 0–5)
HCT: 40.9 % (ref 39.0–52.0)
Hemoglobin: 14.4 g/dL (ref 13.0–17.0)
Lymphocytes Relative: 51 % — ABNORMAL HIGH (ref 12–46)
Lymphs Abs: 2.4 10*3/uL (ref 0.7–4.0)
MCH: 32 pg (ref 26.0–34.0)
MCHC: 35.2 g/dL (ref 30.0–36.0)
MCV: 90.9 fL (ref 78.0–100.0)
Monocytes Absolute: 0.4 10*3/uL (ref 0.1–1.0)
Monocytes Relative: 8 % (ref 3–12)
Neutro Abs: 1.8 10*3/uL (ref 1.7–7.7)
Neutrophils Relative %: 40 % — ABNORMAL LOW (ref 43–77)
Platelets: 201 10*3/uL (ref 150–400)
RBC: 4.5 MIL/uL (ref 4.22–5.81)
RDW: 12.8 % (ref 11.5–15.5)
WBC: 4.6 10*3/uL (ref 4.0–10.5)

## 2012-05-24 LAB — RAPID URINE DRUG SCREEN, HOSP PERFORMED
Amphetamines: NOT DETECTED
Barbiturates: NOT DETECTED
Benzodiazepines: NOT DETECTED
Cocaine: NOT DETECTED
Opiates: POSITIVE — AB
Tetrahydrocannabinol: NOT DETECTED

## 2012-05-24 LAB — COMPREHENSIVE METABOLIC PANEL
ALT: 64 U/L — ABNORMAL HIGH (ref 0–53)
AST: 101 U/L — ABNORMAL HIGH (ref 0–37)
Albumin: 3.8 g/dL (ref 3.5–5.2)
Alkaline Phosphatase: 68 U/L (ref 39–117)
BUN: 5 mg/dL — ABNORMAL LOW (ref 6–23)
CO2: 25 mEq/L (ref 19–32)
Calcium: 8.9 mg/dL (ref 8.4–10.5)
Chloride: 103 mEq/L (ref 96–112)
Creatinine, Ser: 0.81 mg/dL (ref 0.50–1.35)
GFR calc Af Amer: >90 mL/min (ref >90)
GFR calc non Af Amer: >90 mL/min (ref >90)
Glucose, Bld: 96 mg/dL (ref 70–99)
Potassium: 3.8 mEq/L (ref 3.5–5.1)
Sodium: 138 mEq/L (ref 135–145)
Total Bilirubin: 0.5 mg/dL (ref 0.3–1.2)
Total Protein: 7.9 g/dL (ref 6.0–8.3)

## 2012-05-24 LAB — ETHANOL: Alcohol, Ethyl (B): 315 mg/dL — ABNORMAL HIGH (ref 0–11)

## 2012-05-24 LAB — LIPASE, BLOOD: Lipase: 63 U/L — ABNORMAL HIGH (ref 11–59)

## 2012-05-24 LAB — PROTIME-INR
INR: 1 (ref 0.00–1.49)
Prothrombin Time: 13.1 seconds (ref 11.6–15.2)

## 2012-05-24 MED ORDER — CHLORDIAZEPOXIDE HCL 25 MG PO CAPS
25.0000 mg | ORAL_CAPSULE | Freq: Four times a day (QID) | ORAL | Status: AC | PRN
  Administered 2012-05-24 – 2012-05-26 (×4): 25 mg via ORAL
  Filled 2012-05-24 (×4): qty 1

## 2012-05-24 MED ORDER — HYDROXYZINE HCL 25 MG PO TABS
25.0000 mg | ORAL_TABLET | Freq: Four times a day (QID) | ORAL | Status: AC | PRN
  Administered 2012-05-24 – 2012-05-27 (×3): 25 mg via ORAL

## 2012-05-24 MED ORDER — LORAZEPAM 2 MG/ML IJ SOLN: 1.0000 mg | Freq: Four times a day (QID) | INTRAMUSCULAR | Status: DC | PRN

## 2012-05-24 MED ORDER — ONDANSETRON HCL 4 MG PO TABS: 4.0000 mg | ORAL_TABLET | Freq: Three times a day (TID) | ORAL | Status: DC | PRN

## 2012-05-24 MED ORDER — ACETAMINOPHEN 325 MG PO TABS
650.0000 mg | ORAL_TABLET | Freq: Four times a day (QID) | ORAL | Status: DC | PRN
  Administered 2012-05-27 – 2012-05-28 (×2): 650 mg via ORAL

## 2012-05-24 MED ORDER — LORAZEPAM 1 MG PO TABS: 1.0000 mg | ORAL_TABLET | Freq: Three times a day (TID) | ORAL | Status: DC | PRN

## 2012-05-24 MED ORDER — THIAMINE HCL 100 MG/ML IJ SOLN: 100.0000 mg | Freq: Every day | INTRAMUSCULAR | Status: DC

## 2012-05-24 MED ORDER — LOPERAMIDE HCL 2 MG PO CAPS: 2.0000 mg | ORAL_CAPSULE | ORAL | Status: AC | PRN

## 2012-05-24 MED ORDER — ALUM & MAG HYDROXIDE-SIMETH 200-200-20 MG/5ML PO SUSP: 30.0000 mL | ORAL | Status: DC | PRN

## 2012-05-24 MED ORDER — IBUPROFEN 200 MG PO TABS: 600.0000 mg | ORAL_TABLET | Freq: Three times a day (TID) | ORAL | Status: DC | PRN

## 2012-05-24 MED ORDER — LORAZEPAM 1 MG PO TABS: 0.0000 mg | ORAL_TABLET | Freq: Two times a day (BID) | ORAL | Status: DC

## 2012-05-24 MED ORDER — CHLORDIAZEPOXIDE HCL 25 MG PO CAPS
25.0000 mg | ORAL_CAPSULE | ORAL | Status: AC
  Administered 2012-05-27 (×2): 25 mg via ORAL
  Filled 2012-05-24 (×2): qty 1

## 2012-05-24 MED ORDER — CHLORDIAZEPOXIDE HCL 25 MG PO CAPS
25.0000 mg | ORAL_CAPSULE | Freq: Four times a day (QID) | ORAL | Status: AC
  Administered 2012-05-24 – 2012-05-25 (×6): 25 mg via ORAL
  Filled 2012-05-24 (×6): qty 1

## 2012-05-24 MED ORDER — VITAMIN B-1 100 MG PO TABS
100.0000 mg | ORAL_TABLET | Freq: Every day | ORAL | Status: DC
  Administered 2012-05-24: 100 mg via ORAL
  Filled 2012-05-24: qty 1

## 2012-05-24 MED ORDER — LORAZEPAM 1 MG PO TABS
0.0000 mg | ORAL_TABLET | Freq: Four times a day (QID) | ORAL | Status: DC
  Administered 2012-05-24 (×2): 1 mg via ORAL
  Filled 2012-05-24 (×2): qty 1

## 2012-05-24 MED ORDER — LORAZEPAM 1 MG PO TABS: 1.0000 mg | ORAL_TABLET | Freq: Four times a day (QID) | ORAL | Status: DC | PRN

## 2012-05-24 MED ORDER — ONDANSETRON 4 MG PO TBDP: 4.0000 mg | ORAL_TABLET | Freq: Four times a day (QID) | ORAL | Status: AC | PRN

## 2012-05-24 MED ORDER — FOLIC ACID 1 MG PO TABS
1.0000 mg | ORAL_TABLET | Freq: Every day | ORAL | Status: DC
  Administered 2012-05-24: 1 mg via ORAL
  Filled 2012-05-24: qty 1

## 2012-05-24 MED ORDER — ADULT MULTIVITAMIN W/MINERALS CH
1.0000 | ORAL_TABLET | Freq: Every day | ORAL | Status: DC
  Administered 2012-05-24 – 2012-05-28 (×5): 1 via ORAL
  Filled 2012-05-24 (×7): qty 1

## 2012-05-24 MED ORDER — VITAMIN B-1 100 MG PO TABS
100.0000 mg | ORAL_TABLET | Freq: Every day | ORAL | Status: DC
  Administered 2012-05-25 – 2012-05-28 (×4): 100 mg via ORAL
  Filled 2012-05-24 (×6): qty 1

## 2012-05-24 MED ORDER — ADULT MULTIVITAMIN W/MINERALS CH
1.0000 | ORAL_TABLET | Freq: Every day | ORAL | Status: DC
  Administered 2012-05-24: 1 via ORAL
  Filled 2012-05-24: qty 1

## 2012-05-24 MED ORDER — SODIUM CHLORIDE 0.9 % IV SOLN
Freq: Once | INTRAVENOUS | Status: AC
  Administered 2012-05-24: 03:00:00 via INTRAVENOUS

## 2012-05-24 MED ORDER — ALUM & MAG HYDROXIDE-SIMETH 200-200-20 MG/5ML PO SUSP
30.0000 mL | ORAL | Status: DC | PRN
  Administered 2012-05-25 – 2012-05-27 (×2): 30 mL via ORAL

## 2012-05-24 MED ORDER — CHLORDIAZEPOXIDE HCL 25 MG PO CAPS
25.0000 mg | ORAL_CAPSULE | Freq: Every day | ORAL | Status: AC
  Administered 2012-05-28: 25 mg via ORAL
  Filled 2012-05-24: qty 1

## 2012-05-24 MED ORDER — CHLORDIAZEPOXIDE HCL 25 MG PO CAPS
25.0000 mg | ORAL_CAPSULE | Freq: Three times a day (TID) | ORAL | Status: AC
  Administered 2012-05-26 (×3): 25 mg via ORAL
  Filled 2012-05-24 (×3): qty 1

## 2012-05-24 MED ORDER — NICOTINE 21 MG/24HR TD PT24: 21.0000 mg | MEDICATED_PATCH | Freq: Every day | TRANSDERMAL | Status: DC

## 2012-05-24 MED ORDER — GI COCKTAIL ~~LOC~~
30.0000 mL | Freq: Once | ORAL | Status: AC
  Administered 2012-05-24: 30 mL via ORAL
  Filled 2012-05-24: qty 30

## 2012-05-24 MED ORDER — ZOLPIDEM TARTRATE 5 MG PO TABS: 5.0000 mg | ORAL_TABLET | Freq: Every evening | ORAL | Status: DC | PRN

## 2012-05-24 MED ORDER — IBUPROFEN 600 MG PO TABS
600.0000 mg | ORAL_TABLET | Freq: Four times a day (QID) | ORAL | Status: DC | PRN
  Administered 2012-05-24 – 2012-05-26 (×7): 600 mg via ORAL
  Filled 2012-05-24 (×7): qty 1

## 2012-05-24 MED ORDER — MAGNESIUM HYDROXIDE 400 MG/5ML PO SUSP: 30.0000 mL | Freq: Every day | ORAL | Status: DC | PRN
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
## 2012-05-24 NOTE — ED Notes (Signed)
Patient transported to CT 

## 2012-05-24 NOTE — BH Assessment (Signed)
Assessment Note   James [name] is an 52 y.o. male.   Pt made good eye contact, speech clear, Ox3, judgement intact, concentration decreased, mood stable, no HI or SI or AVH.  No hx of behaviors and no hx of Inptx placement per pt.  Pt presents with alcohol detox request.  Pt consumes 12pk to a case of beer per day.  Has done so for years.  Experienced 6 mths of sobriety in prison in 2008.  Incarceration was for possession of THC.  No aggressive or abusive hx per pt.  No communicable diseases.    Pt reports he has never had detox except in prison.  Pt has consuming alcohol since a teenager and drinks daily.  Pt occasionally uses THC but denies use of other substances.  Pt found by police in his car in a parking lot passed out.  Pt denies having memory on how he got there.  Pt appears to be in normal range of intelligence and communicates well.  Pt reports this incident has motivated him to get help.  "I don't want to live like this anymore.  I need help."    Pt experienced a lot of belly pain per the nurse during exam in ED.  Pt may or may not be medically cleared by detox program standards.    Axis I: Major Depression, Recurrent severe and alcohol dep Axis II: Deferred Axis III:  Past Medical History  Diagnosis Date  . GERD (gastroesophageal reflux disease)   . Melanoma    Axis IV: other psychosocial or environmental problems, problems related to social environment and problems with primary support group Axis V: 41-50 serious symptoms  Past Medical History:  Past Medical History  Diagnosis Date  . GERD (gastroesophageal reflux disease)   . Melanoma     Past Surgical History  Procedure Laterality Date  . Lung surgery      Family History: History reviewed. No pertinent family history.  Social History:  reports that he has quit smoking. He does not have any smokeless tobacco history on file. He reports that  drinks alcohol. He reports that he does not use illicit  drugs.  Additional Social History:  Alcohol / Drug Use Pain Medications: na Prescriptions: na Over the Counter: na Longest period of sobriety (when/how long): 6 mths 2008 Negative Consequences of Use: Personal relationships;Work / Optician, dispensing  CIWA: CIWA-Ar BP: 161/82 mmHg Pulse Rate: 84 Nausea and Vomiting: mild nausea with no vomiting Tactile Disturbances: none Tremor: not visible, but can be felt fingertip to fingertip Auditory Disturbances: very mild harshness or ability to frighten Paroxysmal Sweats: no sweat visible Visual Disturbances: not present Anxiety: two Headache, Fullness in Head: very mild Agitation: normal activity Orientation and Clouding of Sensorium: oriented and can do serial additions CIWA-Ar Total: 6 COWS:    Allergies:  Allergies  Allergen Reactions  . Sulfa Antibiotics     Reaction unknown    Home Medications:  (Not in a hospital admission)  OB/GYN Status:  No LMP for male patient.  General Assessment Data Location of Assessment: [facility] Living Arrangements: Parent Can pt return to current living arrangement?: Yes Admission Status: Voluntary Is patient capable of signing voluntary admission?: Yes Transfer from: Acute Hospital Referral Source: MD  Education Status Is patient currently in school?: No  Risk to self Suicidal Ideation: No Suicidal Intent: No Is patient at risk for suicide?: No Suicidal Plan?: No Access to Means: No What has been your use of drugs/alcohol within the last  12 months?: alcohol and THC Previous Attempts/Gestures: No How many times?: 0 Other Self Harm Risks: none Triggers for Past Attempts: None known Intentional Self Injurious Behavior: None Family Suicide History: No Recent stressful life event(s): Other (Comment) (SA use) Persecutory voices/beliefs?: No Depression: Yes Depression Symptoms: Isolating;Fatigue;Guilt;Loss of interest in usual pleasures;Feeling worthless/self pity;Feeling  angry/irritable Substance abuse history and/or treatment for substance abuse?: Yes Suicide prevention information given to non-admitted patients: Not applicable  Risk to Others Homicidal Ideation: No Thoughts of Harm to Others: No Current Homicidal Intent: No Current Homicidal Plan: No Access to Homicidal Means: No Identified Victim: none History of harm to others?: No Assessment of Violence: None Noted Violent Behavior Description: cooperative and polite Does patient have access to weapons?: No Criminal Charges Pending?: No Does patient have a court date: No  Psychosis Hallucinations: None noted (sometimes hears white noise when drinking) Delusions: None noted  Mental Status Report Appear/Hygiene: Disheveled Eye Contact: Fair Motor Activity: Unremarkable Speech: Logical/coherent Level of Consciousness: Alert Mood: Depressed;Anxious;Worthless, low self-esteem Affect: Anxious;Depressed;Appropriate to circumstance Anxiety Level: Moderate Thought Processes: Coherent Judgement: Unimpaired Orientation: Person;Place;Situation Obsessive Compulsive Thoughts/Behaviors: None  Cognitive Functioning Concentration: Decreased Memory: Recent Impaired;Remote Intact IQ: Average Insight: Fair Impulse Control: Poor Appetite: Fair Weight Loss: 0 Weight Gain: 0 Sleep: Decreased Total Hours of Sleep: 3 Vegetative Symptoms: Decreased grooming  ADLScreening [facility]) Patient's cognitive ability adequate to safely complete daily activities?: Yes Patient able to express need for assistance with ADLs?: Yes Independently performs ADLs?: Yes (appropriate for developmental age)  Abuse/Neglect [facility]) Physical Abuse: Denies Verbal Abuse: Denies Sexual Abuse: Denies  Prior Inpatient Therapy Prior Inpatient Therapy: No Prior Therapy Dates: na Prior Therapy Facilty/Provider(s): na Reason for Treatment: na  Prior Outpatient Therapy Prior Outpatient Therapy: No Prior  Therapy Dates: na Prior Therapy Facilty/Provider(s): na Reason for Treatment: na  ADL Screening (condition at time of admission) Patient's cognitive ability adequate to safely complete daily activities?: Yes Patient able to express need for assistance with ADLs?: Yes Independently performs ADLs?: Yes (appropriate for developmental age) Weakness of Legs: None Weakness of Arms/Hands: None  Home Assistive Devices/Equipment Home Assistive Devices/Equipment: None  Therapy Consults (therapy consults require a physician order) PT Evaluation Needed: No OT Evalulation Needed: No SLP Evaluation Needed: No Abuse/Neglect Assessment (Assessment to be complete while patient is alone) Physical Abuse: Denies Verbal Abuse: Denies Sexual Abuse: Denies Exploitation of patient/patient's resources: Denies Self-Neglect: Denies Values / Beliefs Cultural Requests During Hospitalization: None Spiritual Requests During Hospitalization: None Consults Spiritual Care Consult Needed: No Social Work Consult Needed: No Merchant navy officer (For Healthcare) Advance Directive: Patient does not have advance directive Pre-existing out of facility DNR order (yellow form or pink MOST form): No    Additional Information 1:1 In Past 12 Months?: No CIRT Risk: No Elopement Risk: No Does patient have medical clearance?: No (possible issues; belly pain, ck with nurse)     Disposition: Referred to [facility], [facility], [facility] for detox and under review.  Issues with medical clearance may arise due to limits of these programs.    Disposition Initial Assessment Completed for this Encounter: Yes Disposition of Patient: Other dispositions (referred to detox program) Other disposition(s): Referred to outside facility (referred to [facility], [facility] and [facility] for review)  On Site Evaluation by:   Reviewed with Physician:     Titus Mould, Eppie Gibson 05/24/2012 11:00 AM

## 2012-05-24 NOTE — ED Notes (Signed)
3 bags of belongings sent w/ pt, valuable envelope from security verified w/ security and sent w/ pt's belongings

## 2012-05-24 NOTE — ED Notes (Signed)
Pt ambulatory to [facility] w/ security and NT

## 2012-05-24 NOTE — ED Notes (Signed)
ACT into see 

## 2012-05-24 NOTE — BH Assessment (Signed)
[facility] Assessment Progress Note  Per Jacquelyne Balint, RN, Administrative Coordinator, pt was reviewed with Assunta Found, FNP, who has agreed to admit pt to [facility] to the service of Patrick North, MD,  Rm [id number].  I called Scherrie Merritts, LCSW, Assessment Counselor, to notify him at 12:15.  Doylene Canning, MA Assessment Counselor 05/24/2012 @ 12:20

## 2012-05-24 NOTE — Tx Team (Signed)
Initial Interdisciplinary Treatment Plan  PATIENT STRENGTHS: (choose at least two) General fund of knowledge Supportive family/friends Work skills  PATIENT STRESSORS: Paediatric nurse issue Substance abuse   PROBLEM LIST: Problem List/Patient Goals Date to be addressed Date deferred Reason deferred Estimated date of resolution  ETOH dependence                                                       DISCHARGE CRITERIA:  Ability to meet basic life and health needs Improved stabilization in mood, thinking, and/or behavior Withdrawal symptoms are absent or subacute and managed without 24-hour nursing intervention  PRELIMINARY DISCHARGE PLAN: Attend aftercare/continuing care group Attend 12-step recovery group  PATIENT/FAMIILY INVOLVEMENT: This treatment plan has been presented to and reviewed with the patient, James [name], and/or family member, .  The patient and family have been given the opportunity to ask questions and make suggestions.  Andrena Mews 05/24/2012, 5:16 PM

## 2012-05-24 NOTE — ED Provider Notes (Signed)
History     CSN: [id number]  Arrival date & time 05/24/12  0132   First MD Initiated Contact with Patient 05/24/12 0145      No chief complaint on file.   (Consider location/radiation/quality/duration/timing/severity/associated sxs/prior treatment) HPI Comments: The patient was brought in by the police after being found sitting behind a sterile of a car in a parking lot with an open bottle of whiskey and beer.  Patient states he does not feel right, but cannot elicit further he is foggy about the events of the, day.  States he knows.  He was drinking yesterday morning, but doesn't remember much of the rest of the, day.  He denies any drug use, he is vague about his address, but he does know he was with his mother for the past several years.  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  He does know that his left shoulder.  Has been hurting him for several, days.  He denies shortness of breath, chest pain, nausea, vomiting, headache, visual disturbance.  Psychiatric history.  The history is provided by the patient.    Past Medical History  Diagnosis Date  . GERD (gastroesophageal reflux disease)   . Melanoma     Past Surgical History  Procedure Laterality Date  . Lung surgery      History reviewed. No pertinent family history.  History  Substance Use Topics  . Smoking status: Former Games developer  . Smokeless tobacco: Not on file  . Alcohol Use: Yes     Comment: social      Review of Systems  Constitutional: Negative for activity change and appetite change.  HENT: Negative for neck pain.   Eyes: Negative for visual disturbance.  Respiratory: Negative for cough and shortness of breath.   Cardiovascular: Negative for chest pain.  Gastrointestinal: Negative for nausea, vomiting and abdominal pain.  Genitourinary: Negative for dysuria.  Musculoskeletal: Negative for back pain and joint swelling.  Neurological: Negative for dizziness, weakness and headaches.   All other systems reviewed and are negative.    Allergies  Sulfa antibiotics  Home Medications   No current outpatient prescriptions on file.  BP 164/94  Pulse 107  Temp(Src) 99.7 F (37.6 C) (Oral)  Resp 18  SpO2 98%  Physical Exam  Vitals reviewed. Constitutional: He appears well-developed and well-nourished.  HENT:  Head: Normocephalic and atraumatic.  Right Ear: External ear normal.  Left Ear: External ear normal.  Eyes: Pupils are equal, round, and reactive to light.  Neck: Normal range of motion. Neck supple.  Cardiovascular: Normal rate and regular rhythm.   Pulmonary/Chest: Effort normal and breath sounds normal.  Abdominal: Soft. Bowel sounds are normal.  Musculoskeletal: Normal range of motion.  Neurological: He is alert.  Skin: Skin is warm. No rash noted. No pallor.    ED Course  Procedures (including critical care time)  Labs Reviewed  CBC WITH DIFFERENTIAL - Abnormal; Notable for the following:    Neutrophils Relative 40 (*)    Lymphocytes Relative 51 (*)    All other components within normal limits  COMPREHENSIVE METABOLIC PANEL - Abnormal; Notable for the following:    BUN 5 (*)    AST 101 (*)    ALT 64 (*)    All other components within normal limits  ETHANOL - Abnormal; Notable for the following:    Alcohol, Ethyl (B) 315 (*)    All other components within normal limits  URINE RAPID DRUG SCREEN (HOSP  PERFORMED) - Abnormal; Notable for the following:    Opiates POSITIVE (*)    All other components within normal limits  LIPASE, BLOOD - Abnormal; Notable for the following:    Lipase 63 (*)    All other components within normal limits  PROTIME-INR   Ct Head Wo Contrast  05/24/2012  *RADIOLOGY REPORT*  Clinical Data: Confusion.  History of alcohol abuse.  CT HEAD WITHOUT CONTRAST  Technique:  Contiguous axial images were obtained from the base of the skull through the vertex without contrast.  Comparison: No priors.  Findings: No acute  intracranial abnormalities.  Specifically, no evidence of acute intracranial hemorrhage, no definite findings of acute/subacute cerebral ischemia, no mass, mass effect, hydrocephalus or abnormal intra or extra-axial fluid collections. Visualized paranasal sinuses and mastoids are well pneumatized.  No acute displaced skull fractures are identified.  IMPRESSION: 1.  No acute intracranial abnormalities. 2.  The appearance of the brain is normal.   Original Report Authenticated By: Trudie Reed, M.D.    Dg Abd Acute W/chest  05/24/2012  *RADIOLOGY REPORT*  Clinical Data: Epigastric and left upper quadrant pain  ACUTE ABDOMEN SERIES (ABDOMEN 2 VIEW & CHEST 1 VIEW)  Comparison: 08/08/2004  Findings: Normal heart size and vascularity.  Negative for CHF, pneumonia, effusion, pneumothorax.  Minimal streaky left base atelectasis.  No free air.  Scattered air and stool throughout the bowel.  Negative for obstruction or ileus.  Mildly prominent gastric rugal folds noted in the left upper quadrant.  Nonspecific by plain radiography but can be seen with gastritis.  Pelvic calcifications consistent with venous phleboliths.  No osseous abnormality.  IMPRESSION: Negative for obstruction or free air.  Gastric rugal fold thickening, can be seen with gastritis.   Original Report Authenticated By: Judie Petit. Shick, M.D.      1. Alcohol abuse   2. Gastritis       MDM          Arman Filter, NP 05/25/12 0040

## 2012-05-24 NOTE — ED Notes (Signed)
Back from xray

## 2012-05-24 NOTE — Progress Notes (Signed)
Pt declined at [facility] by Everlean Alstrom due to no beds; [facility] declined by Alana due to acuity.  [facility] to review for possible admit.  Pt denies seizure hx but admits he has no idea how he will physically manage coming off alcohol since he has been consuming so much for so long.

## 2012-05-24 NOTE — ED Notes (Signed)
Pt sleeping soundly, easily aroused, pt is aware that he will transfer shortly

## 2012-05-24 NOTE — ED Notes (Signed)
PA-Emily -is aware of abd pain and will eval

## 2012-05-24 NOTE — ED Notes (Signed)
Pt's wallet locked in security office - $603.00 and court date/rights in wallet. 3 bags of belongings in locker #39. 1 watch, 1 ring in denture cup in pt belonging bag

## 2012-05-24 NOTE — ED Notes (Signed)
Pt reports feeling dizzy and seeing white circles.

## 2012-05-24 NOTE — ED Notes (Signed)
Per EMS report: Pt from home: Pt was found in the [employer] parking lot in his car.  [employer] found pt asleep in car with a fifth of Whiskey and beer. EMS reports smelling of ETOH.  Pt c/o of not "feeling right."  Pt a/o to himself, date, where he lives.  EMS 12 lead was NSR.  Pt denies any pain.  BP: 158/92, HR: 100, CBG: 80, 98%RA, RR: 18

## 2012-05-24 NOTE — ED Provider Notes (Signed)
6:50 AM  Assumed care of patient from Earley Favor, NP, at change of shift.  Pt presented with alcohol intoxication, brought in by police.  Pt is now alert and speaking clearly, able to ambulate in the hallway without assistance or any difficulty.    7:27 AM Pt is now requesting detox.  States he has been drinking 5-6 beers daily x 10 years.  States he does get "nervous" when he does not drink.  States he is currently feeling "edgy."  Pt is A&O, NAD, RRR, CTAB, abd soft, diffuse tenderness without guarding or rebound.   10:07 AM I spoke with Reita Cliche, ACT team, who will see and evaluate patient for detox.   10:50 AM Per psych nurse, pt now complaining of LUQ pain.  Pt reports it feels like a cramp, has been present and constant for 3-4 days, no exacerbating or alleviating factors, 5/10 intensity.  Abdomen is soft, TTP LUQ, LLQ, no guarding, no rebound.  Lipase and xray added to workup.  Pancreatitis vs alcoholic gastritis.    Pt with ETOH abuse, requesting detox.  Also with what is likely alcoholic gastritis.  Pt evaluated by ACT and accepted at [facility].  Transferred to [facility].    Results for orders placed during the hospital encounter of 05/24/12  CBC WITH DIFFERENTIAL      Result Value Range   WBC 4.6  4.0 - 10.5 K/uL   RBC 4.50  4.22 - 5.81 MIL/uL   Hemoglobin 14.4  13.0 - 17.0 g/dL   HCT 16.1  09.6 - 04.5 %   MCV 90.9  78.0 - 100.0 fL   MCH 32.0  26.0 - 34.0 pg   MCHC 35.2  30.0 - 36.0 g/dL   RDW 40.9  81.1 - 91.4 %   Platelets 201  150 - 400 K/uL   Neutrophils Relative 40 (*) 43 - 77 %   Neutro Abs 1.8  1.7 - 7.7 K/uL   Lymphocytes Relative 51 (*) 12 - 46 %   Lymphs Abs 2.4  0.7 - 4.0 K/uL   Monocytes Relative 8  3 - 12 %   Monocytes Absolute 0.4  0.1 - 1.0 K/uL   Eosinophils Relative 1  0 - 5 %   Eosinophils Absolute 0.0  0.0 - 0.7 K/uL   Basophils Relative 1  0 - 1 %   Basophils Absolute 0.0  0.0 - 0.1 K/uL  COMPREHENSIVE METABOLIC PANEL      Result Value Range   Sodium 138  135 - 145 mEq/L   Potassium 3.8  3.5 - 5.1 mEq/L   Chloride 103  96 - 112 mEq/L   CO2 25  19 - 32 mEq/L   Glucose, Bld 96  70 - 99 mg/dL   BUN 5 (*) 6 - 23 mg/dL   Creatinine, Ser 7.82  0.50 - 1.35 mg/dL   Calcium 8.9  8.4 - 95.6 mg/dL   Total Protein 7.9  6.0 - 8.3 g/dL   Albumin 3.8  3.5 - 5.2 g/dL   AST 213 (*) 0 - 37 U/L   ALT 64 (*) 0 - 53 U/L   Alkaline Phosphatase 68  39 - 117 U/L   Total Bilirubin 0.5  0.3 - 1.2 mg/dL   GFR calc non Af Amer >90  >90 mL/min   GFR calc Af Amer >90  >90 mL/min  ETHANOL      Result Value Range   Alcohol, Ethyl (B) 315 (*) 0 - 11 mg/dL  PROTIME-INR      Result Value Range   Prothrombin Time 13.1  11.6 - 15.2 seconds   INR 1.00  0.00 - 1.49  URINE RAPID DRUG SCREEN (HOSP PERFORMED)      Result Value Range   Opiates POSITIVE (*) NONE DETECTED   Cocaine NONE DETECTED  NONE DETECTED   Benzodiazepines NONE DETECTED  NONE DETECTED   Amphetamines NONE DETECTED  NONE DETECTED   Tetrahydrocannabinol NONE DETECTED  NONE DETECTED   Barbiturates NONE DETECTED  NONE DETECTED  LIPASE, BLOOD      Result Value Range   Lipase 63 (*) 11 - 59 U/L   Ct Head Wo Contrast  05/24/2012  *RADIOLOGY REPORT*  Clinical Data: Confusion.  History of alcohol abuse.  CT HEAD WITHOUT CONTRAST  Technique:  Contiguous axial images were obtained from the base of the skull through the vertex without contrast.  Comparison: No priors.  Findings: No acute intracranial abnormalities.  Specifically, no evidence of acute intracranial hemorrhage, no definite findings of acute/subacute cerebral ischemia, no mass, mass effect, hydrocephalus or abnormal intra or extra-axial fluid collections. Visualized paranasal sinuses and mastoids are well pneumatized.  No acute displaced skull fractures are identified.  IMPRESSION: 1.  No acute intracranial abnormalities. 2.  The appearance of the brain is normal.   Original Report Authenticated By: Trudie Reed, M.D.    Dg Abd Acute  W/chest  05/24/2012  *RADIOLOGY REPORT*  Clinical Data: Epigastric and left upper quadrant pain  ACUTE ABDOMEN SERIES (ABDOMEN 2 VIEW & CHEST 1 VIEW)  Comparison: 08/08/2004  Findings: Normal heart size and vascularity.  Negative for CHF, pneumonia, effusion, pneumothorax.  Minimal streaky left base atelectasis.  No free air.  Scattered air and stool throughout the bowel.  Negative for obstruction or ileus.  Mildly prominent gastric rugal folds noted in the left upper quadrant.  Nonspecific by plain radiography but can be seen with gastritis.  Pelvic calcifications consistent with venous phleboliths.  No osseous abnormality.  IMPRESSION: Negative for obstruction or free air.  Gastric rugal fold thickening, can be seen with gastritis.   Original Report Authenticated By: Judie Petit. Miles Costain, M.D.       Trixie Dredge, PA-C 05/24/12 1524

## 2012-05-24 NOTE — ED Notes (Signed)
Bed:WA08<BR> Expected date:<BR> Expected time:<BR> Means of arrival:<BR> Comments:<BR> EMS

## 2012-05-25 ENCOUNTER — Encounter (HOSPITAL_COMMUNITY): Payer: Self-pay | Admitting: Psychiatry

## 2012-05-25 DIAGNOSIS — F1020 Alcohol dependence, uncomplicated: Secondary | ICD-10-CM | POA: Diagnosis present

## 2012-05-25 NOTE — ED Provider Notes (Signed)
Medical screening examination/treatment/procedure(s) were performed by non-physician practitioner and as supervising physician I was immediately available for consultation/collaboration.  John-Adam Bonk, M.D.    John-Adam Bonk, MD 05/25/12 0534 

## 2012-05-25 NOTE — Progress Notes (Signed)
Adult Psychoeducational Group Note  Date:  05/25/2012 Time:  6:46 PM  Group Topic/Focus:  Self Care:   The focus of this group is to help patients understand the importance of self-care in order to improve or restore emotional, physical, spiritual, interpersonal, and financial health.  Participation Level:  Minimal  Participation Quality:  Appropriate  Affect:  Flat  Cognitive:  Appropriate  Insight: Lacking  Engagement in Group:  Limited  Modes of Intervention:  Discussion and Education  Additional Comments:  Pt participated in group discussion on the multi-faceted nature of the self. Pt actively participated in group by completing the self-care inventory and sharing areas in which he excels in his self-care, as well as areas in which he needs improvement.    Reinaldo Raddle K 05/25/2012, 6:46 PM

## 2012-05-25 NOTE — [facility] Counselor (Signed)
Adult Comprehensive Assessment  Patient ID: James [name], male   DOB: [birth year], 52 y.o.   MRN: [id number]  Information Source: Information source: Patient  Current Stressors:  Educational / Learning stressors: N/A Employment / Job issues: Not employed full time Family Relationships: N/A Surveyor, quantity / Lack of resources (include bankruptcy): Little income Housing / Lack of housing: N/A Physical health (include injuries & life threatening diseases): N/A Social relationships: N/A Substance abuse: Yes   Alcoholism Bereavement / Loss: N/A  Living/Environment/Situation:  Living Arrangements: Parent Living conditions (as described by patient or guardian): We get along fine How long has patient lived in current situation?: 9 mos. What is atmosphere in current home: Comfortable;Loving;Supportive  Family History:  Marital status: Divorced Divorced, when?: since o8  What types of issues is patient dealing with in the relationship?: no real contact Additional relationship information: one relationship since for 2 years-I'm on probation now because I went to her place to get some stuff back Does patient have children?: Yes How many children?: 3 How is patient's relationship with their children?: good  Childhood History:  By whom was/is the patient raised?: Both parents Description of patient's relationship with caregiver when they were a child: good Patient's description of current relationship with people who raised him/her: good Does patient have siblings?: Yes Number of Siblings: 1 Description of patient's current relationship with siblings: sister-good Did patient suffer any verbal/emotional/physical/sexual abuse as a child?: No Did patient suffer from severe childhood neglect?: No Has patient ever been sexually abused/assaulted/raped as an adolescent or adult?: No Was the patient ever a victim of a crime or a disaster?: No Witnessed domestic violence?: No Has patient been  effected by domestic violence as an adult?: No  Education:  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, nature Currently a student?: No Learning disability?: No  Employment/Work Situation:   Employment situation: Employed 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 Patient's job has been impacted by current illness: No What is the longest time patient has a held a job?: 19 yrs Where was the patient employed at that time?: HV[employer] Has patient ever been in the [employer]?: No  Financial Resources:   Financial resources: Income from employment Does patient have a representative payee or guardian?: No  Alcohol/Substance Abuse:   What has been your use of drugs/alcohol within the last 12 months?: 12 pack to case beer and several times a week a pint of liquor Alcohol/Substance Abuse Treatment Hx: Past detox If yes, describe treatment: Didn't finish it  2 yers ago  Has alcohol/substance abuse ever caused legal problems?: Yes (2 DUI's are pending )  Social Support System:   Patient's Community Support System: Good Describe Community Support System: family    Type of faith/religion: Ephriam Knuckles How does patient's faith help to cope with current illness?: not practicing  Leisure/Recreation:   Leisure and Hobbies: Fishin, shoot pool  Strengths/Needs:   What things does the patient do well?: Formerly drink, trouble shooting, tools In what areas does patient struggle / problems for patient: reading a book with samll letters  Discharge Plan:   Does patient have access to transportation?: Yes (scooter) Will patient be returning to same living situation after discharge?: Yes Currently receiving community mental health services: Yes (From Whom) (possibly rehab-thinking about it) If no, would patient like referral for services when discharged?:  ([place]) Does patient have financial barriers related to discharge medications?:  Yes Patient description of barriers related  to discharge medications: limited income, no insurance  Summary/Recommendations:   Summary and Recommendations (to be completed by the evaluator): James [name] presented to [place] for detox after experiencing 'the first blackout I ever had."  He also has 2 DUI's for which he has not yet gone to court.  He decided it was time to get help.  He can benefit from detox, medication management, therapeutic milieu and referral for services.  He is deciding between rehab or going home.  James [name]. 05/25/2012

## 2012-05-25 NOTE — ED Provider Notes (Signed)
Medical screening examination/treatment/procedure(s) were performed by non-physician practitioner and as supervising physician I was immediately available for consultation/collaboration.  Jones Skene, M.D.    Jones Skene, MD 05/25/12 [phone]

## 2012-05-25 NOTE — Tx Team (Signed)
  Interdisciplinary Treatment Plan Update   Date Reviewed:  05/25/2012  Time Reviewed:  8:37 AM  Progress in Treatment:   Attending groups: No Participating in groups: No Taking medication as prescribed: Yes  Tolerating medication: Yes Family/Significant other contact made: No  Patient understands diagnosis: Yes As evidenced by asking for help with alcohol detox Discussing patient identified problems/goals with staff: Yes See initial plan Medical problems stabilized or resolved: Yes Denies suicidal/homicidal ideation: Yes In tx team Patient has not harmed self or others: Yes  For review of initial/current patient goals, please see plan of care.  Estimated Length of Stay:  3-4 days  Reason for Continuation of Hospitalization: Medication stabilization Withdrawal symptoms  New Problems/Goals identified:  N/A  Discharge Plan or Barriers:   [name] trying to decide between rhab or going home and attending daily AA mtgs.  Additional Comments: James [name] is an 52 y.o. male.  Pt made good eye contact, speech clear, Ox3, judgement intact, concentration decreased, mood stable, no HI or SI or AVH. No hx of behaviors and no hx of Inptx placement per pt.  Pt presents with alcohol detox request. Pt consumes 12pk to a case of beer per day. Has done so for years. Experienced 6 mths of sobriety in prison in 2008. Incarceration was for possession of THC. No aggressive or abusive hx per pt. No communicable diseases.  Pt reports he has never had detox except in prison. Pt has consuming alcohol since a teenager and drinks daily. Pt occasionally uses THC but denies use of other substances.  Pt found by police in his car in a parking lot passed out. Pt denies having memory on how he got there. Pt appears to be in normal range of intelligence and communicates well. Pt reports this incident has motivated him to get help. "I don't want to live like this anymore. I need help."    Attendees:  Signature:  Thedore Mins, MD 05/25/2012 8:37 AM   Signature: Richelle Ito, LCSW 05/25/2012 8:37 AM  Signature: Neill Loft, RN 05/25/2012 8:37 AM  Signature:  05/25/2012 8:37 AM  Signature:  05/25/2012 8:37 AM  Signature:  05/25/2012 8:37 AM  Signature:   05/25/2012 8:37 AM  Signature:    Signature:    Signature:    Signature:    Signature:    Signature:      Scribe for Treatment Team:   Richelle Ito, LCSW  05/25/2012 8:37 AM

## 2012-05-25 NOTE — Progress Notes (Signed)
[facility] LCSW Group Therapy        Overcoming Obstacles 1:15 2:30 PM         05/25/2012 3:05 PM  Type of Therapy:  Group Therapy  Participation Level:  Did Not Attend  Wynn Banker 05/25/2012, 3:05 PM

## 2012-05-25 NOTE — Progress Notes (Signed)
Patient ID: James [name], male   DOB: 10-06-1960, 52 y.o.   MRN: [id number] D- Patient reports fair sleep and poor appetite.  His energy level is low and his ability to pay attention is poor.  He rates his anxiety at 7 and hopelessness at 7.  He is craving and tremulous.  A- Talked with patient about safe and comfortable detox.  Encouraged him to ask for what he needs.  R-Patient has seemed to feel progressively better as day goes on.   He is making phone calls between groups and although he initially said he did not want to go to groups, he is programming on the 300 hall.

## 2012-05-25 NOTE — [facility] Suicide Risk Assessment (Signed)
Suicide Risk Assessment  Admission Assessment     Nursing information obtained from:    Demographic factors:    Current Mental Status:    Loss Factors:    Historical Factors:    Risk Reduction Factors:     CLINICAL FACTORS:   Depression:   Comorbid alcohol abuse/dependence Impulsivity Insomnia Alcohol/Substance Abuse/Dependencies  COGNITIVE FEATURES THAT CONTRIBUTE TO RISK:  Closed-mindedness    SUICIDE RISK:   Minimal: No identifiable suicidal ideation.  Patients presenting with no risk factors but with morbid ruminations; may be classified as minimal risk based on the severity of the depressive symptoms  PLAN OF CARE:1. Admit for crisis management and stabilization. 2. Medication management to reduce current symptoms to base line and improve the patient's overall level of functioning 3. Treat health problems as indicated. 4. Develop treatment plan to decrease risk of relapse upon discharge and the need for readmission. 5. Psycho-social education regarding relapse prevention and self care. 6. Health care follow up as needed for medical problems. 7. Restart home medications where appropriate.   I certify that inpatient services furnished can reasonably be expected to improve the patient's condition.  Akintayo, Mojeed,MD 05/25/2012, 11:33 AM

## 2012-05-25 NOTE — Progress Notes (Signed)
Pt has rested throughout the night without complaint however this morning BP is elevated at 156/115 with pulse of 106. Pt unsteady on feet but denies headache. CIWA is a 6. Medicated with librium prn. Pitcher of gatorade provided and encouraged. Pt assisted back to bed, fall precautions reviewed. On reassess, pt's manual pressure is 148/90 with pulse of 84. Will bring breakfast tray back for patient. Lawrence Marseilles

## 2012-05-25 NOTE — Progress Notes (Signed)
D.  Pt. Has flat affect, anxious mood.  Compliant with medications. CIWA 4.    Pt.'s goal is to stop drinking. A.  Encouragement and support given. R.  Pt. Receptive.
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
## 2012-05-25 NOTE — Progress Notes (Signed)
Pt has been resting in bed all evening, sleeping on and off. Did not attend evening group. Complaining of withdrawal. CIWA is an 8, BP slightly elevated. Affect and mood anxious. Medicated per orders. Vistaril prn given as well. Support offered. On reassess, pt is asleep. He denies SI/HI/AVH and is safe at this time. Lawrence Marseilles

## 2012-05-25 NOTE — Progress Notes (Signed)
[facility] LCSW Aftercare Discharge Planning Group Note  05/25/2012 12:57 PM  Participation Quality:  Did not attend group.   Wynn Banker 05/25/2012, 12:57 PM

## 2012-05-26 DIAGNOSIS — F10239 Alcohol dependence with withdrawal, unspecified: Secondary | ICD-10-CM

## 2012-05-26 MED ORDER — TRAZODONE HCL 100 MG PO TABS
100.0000 mg | ORAL_TABLET | Freq: Every day | ORAL | Status: DC
  Administered 2012-05-26 – 2012-05-27 (×2): 100 mg via ORAL
  Filled 2012-05-26: qty 1
  Filled 2012-05-26 (×2): qty 14
  Filled 2012-05-26 (×3): qty 1

## 2012-05-26 NOTE — Progress Notes (Signed)
Patient ID: James [name], male   DOB: 09-13-1960, 52 y.o.   MRN: [id number]  D: Patient lying in bed with eyes closed. Respirations even and non-labored.  A: Staff will monitor on q 15 minute checks, follow treatment plan, and give meds as ordered. R: Appears to be sleeping.

## 2012-05-26 NOTE — Progress Notes (Signed)
[facility] MD Progress Note  05/26/2012 3:08 PM James [name]  MRN:  [id number] Subjective:  Patient stated that today he has bad day because of stomach virus and less socialization. He has less alcohol withdrawal symptoms.   Diagnosis:  Axis I: Substance Induced Mood Disorder and Alcohol dependence and withdrawal  ADL's:  Impaired  Sleep: Poor  Appetite:  Poor  Suicidal Ideation:  Denied suicidal ideations, intension and plans Homicidal Ideation:  Denied Homicidal ideations, intension and plans AEB (as evidenced by): he continues to have sweating and fine tremors. He has no stomach upset.  Psychiatric Specialty Exam: ROS  Blood pressure 135/84, pulse 99, temperature 97.7 F (36.5 C), temperature source Oral, resp. rate 20, height 5\' 10"  (1.778 m), weight 185 lb (83.915 kg).Body mass index is 26.54 kg/(m^2).  General Appearance: Casual and Guarded  Eye Contact::  Fair  Speech:  Clear and Coherent  Volume:  Decreased  Mood:  Anxious and Depressed  Affect:  Constricted and Depressed  Thought Process:  Coherent, Goal Directed and Intact  Orientation:  Full (Time, Place, and Person)  Thought Content:  Obsessions and Rumination  Suicidal Thoughts:  No  Homicidal Thoughts:  No  Memory:  Immediate;   Fair  Judgement:  Impaired  Insight:  Lacking  Psychomotor Activity:  Decreased  Concentration:  Fair  Recall:  Fair  Akathisia:  No  Handed:  Right  AIMS (if indicated):     Assets:  Communication Skills Desire for Improvement Financial Resources/Insurance Social Support Transportation  Sleep:  Number of Hours: 4.75   Current Medications: Current Facility-Administered Medications  Medication Dose Route Frequency Provider Last Rate Last Dose  . acetaminophen (TYLENOL) tablet 650 mg  650 mg Oral Q6H PRN Shuvon Rankin, NP      . alum & mag hydroxide-simeth (MAALOX/MYLANTA) 200-200-20 MG/5ML suspension 30 mL  30 mL Oral Q4H PRN Shuvon Rankin, NP   30 mL at 05/25/12 2201  .  chlordiazePOXIDE (LIBRIUM) capsule 25 mg  25 mg Oral Q6H PRN Shuvon Rankin, NP   25 mg at 05/26/12 0618  . chlordiazePOXIDE (LIBRIUM) capsule 25 mg  25 mg Oral TID Shuvon Rankin, NP   25 mg at 05/26/12 1112   Followed by  . [START ON 05/27/2012] chlordiazePOXIDE (LIBRIUM) capsule 25 mg  25 mg Oral BH-qamhs Shuvon Rankin, NP       Followed by  . [START ON 05/28/2012] chlordiazePOXIDE (LIBRIUM) capsule 25 mg  25 mg Oral Daily Shuvon Rankin, NP      . hydrOXYzine (ATARAX/VISTARIL) tablet 25 mg  25 mg Oral Q6H PRN Shuvon Rankin, NP   25 mg at 05/24/12 2129  . ibuprofen (ADVIL,MOTRIN) tablet 600 mg  600 mg Oral QID PRN Jorje Guild, PA-C   600 mg at 05/26/12 1113  . loperamide (IMODIUM) capsule 2-4 mg  2-4 mg Oral PRN Shuvon Rankin, NP      . magnesium hydroxide (MILK OF MAGNESIA) suspension 30 mL  30 mL Oral Daily PRN Shuvon Rankin, NP      . multivitamin with minerals tablet 1 tablet  1 tablet Oral Daily Shuvon Rankin, NP   1 tablet at 05/26/12 0800  . ondansetron (ZOFRAN-ODT) disintegrating tablet 4 mg  4 mg Oral Q6H PRN Shuvon Rankin, NP      . thiamine (VITAMIN B-1) tablet 100 mg  100 mg Oral Daily Shuvon Rankin, NP   100 mg at 05/26/12 0759    Lab Results: No results found for this or any previous visit (from  the past 48 hour(s)).  Physical Findings: AIMS: Facial and Oral Movements Muscles of Facial Expression: None, normal Lips and Perioral Area: None, normal Jaw: None, normal Tongue: None, normal,Extremity Movements Upper (arms, wrists, hands, fingers): None, normal Lower (legs, knees, ankles, toes): None, normal, Trunk Movements Neck, shoulders, hips: None, normal, Overall Severity Severity of abnormal movements (highest score from questions above): None, normal Incapacitation due to abnormal movements: None, normal Patient's awareness of abnormal movements (rate only patient's report): No Awareness, Dental Status Does patient usually wear dentures?: No  CIWA:  CIWA-Ar Total: 4 COWS:      Treatment Plan Summary: Daily contact with patient to assess and evaluate symptoms and progress in treatment Medication management  Plan: Will continue Librium protocol and monitor with CIWA protocol. May use Trazodone 100 mg for sleep.  Medical Decision Making Problem Points:  Established problem, worsening (2), New problem, with no additional work-up planned (3) and Review of psycho-social stressors (1) Data Points:  Independent review of image, tracing, or specimen (2) Review or order clinical lab tests (1) Review of medication regiment & side effects (2) Review of new medications or change in dosage (2)  I certify that inpatient services furnished can reasonably be expected to improve the patient's condition.   JONNALAGADDA,JANARDHAHA R. 05/26/2012, 3:08 PM

## 2012-05-26 NOTE — Progress Notes (Signed)
D: Patient in the the doorway of his room on approach.  Patient states he has had a long boring day due to not being able to come out of his room today due to the suspected norovirus going around in the hospital.  Patient states he has had tremors today but states that the medications he is taking helps.  Patient denies SI/HI and denies AVH. A: Staff to monitor Q 15 mins for safety.  Encouragement and support offered.  Scheduled medications administered per orders.  Librium administered prn for withdrawal symptoms. R: Patient remains safe on the unit/  Patient cooperative and taking administered medications.  Patient interacting with peers.

## 2012-05-26 NOTE — Progress Notes (Signed)
Pt denies SI/HI/AVH. Pt denies feeling depressed today. Pt is exhibiting some physical tremors. Pt received scheduled librium for his withdrawal s/s. Pt requested to move to another room d/t his roommate having virus. Pt is now in room 503. Pt present with appropriate behavior.   Medications administered as ordered per MD. Verbal support given. 15 minute checks performed for safety.  Pt is compliant with treatment. Pt receptive to treatment.

## 2012-05-27 DIAGNOSIS — F1994 Other psychoactive substance use, unspecified with psychoactive substance-induced mood disorder: Secondary | ICD-10-CM

## 2012-05-27 DIAGNOSIS — F1910 Other psychoactive substance abuse, uncomplicated: Secondary | ICD-10-CM

## 2012-05-27 DIAGNOSIS — F411 Generalized anxiety disorder: Secondary | ICD-10-CM

## 2012-05-27 MED ORDER — HYDROCHLOROTHIAZIDE 25 MG PO TABS
25.0000 mg | ORAL_TABLET | Freq: Every day | ORAL | Status: DC
  Administered 2012-05-27 – 2012-05-28 (×2): 25 mg via ORAL
  Filled 2012-05-27 (×4): qty 1
  Filled 2012-05-27 (×2): qty 14

## 2012-05-27 MED ORDER — NICOTINE POLACRILEX 2 MG MT GUM: 2.0000 mg | CHEWING_GUM | OROMUCOSAL | Status: DC | PRN

## 2012-05-27 MED ORDER — PANTOPRAZOLE SODIUM 40 MG PO TBEC
40.0000 mg | DELAYED_RELEASE_TABLET | Freq: Every day | ORAL | Status: DC
  Administered 2012-05-27 – 2012-05-28 (×2): 40 mg via ORAL
  Filled 2012-05-27 (×2): qty 14
  Filled 2012-05-27 (×4): qty 1

## 2012-05-27 MED ORDER — POTASSIUM CHLORIDE CRYS ER 10 MEQ PO TBCR
10.0000 meq | EXTENDED_RELEASE_TABLET | Freq: Two times a day (BID) | ORAL | Status: DC
  Administered 2012-05-27 – 2012-05-28 (×2): 10 meq via ORAL
  Filled 2012-05-27 (×6): qty 1

## 2012-05-27 NOTE — Progress Notes (Signed)
[facility] MD Progress Note  05/27/2012 1:58 PM James [name]  MRN:  [id number] Subjective:  James [name] was lying in bed in his room.  He complained of nausea and diarrhea, started at 4 am.  It initially started as heart burn but questionable Norovirus--precautions in place.  Librium protocol continues for his alcohol detox.  He did state he was doing better until this morning, forwards little information due to illness.  His room-mate was moved to another room.  Diagnosis:   Axis I: Anxiety Disorder NOS, Substance Abuse and Substance Induced Mood Disorder Axis II: Deferred Axis III:  Past Medical History  Diagnosis Date  . GERD (gastroesophageal reflux disease)   . Melanoma    Axis IV: economic problems, occupational problems, other psychosocial or environmental problems, problems related to legal system/crime, problems related to social environment and problems with primary support group Axis V: 41-50 serious symptoms  ADL's:  Intact  Sleep: Fair  Appetite:  Poor  Suicidal Ideation:  Denies Homicidal Ideation:  Denies  Psychiatric Specialty Exam: Review of Systems  Constitutional: Negative.   HENT: Negative.   Eyes: Negative.   Respiratory: Negative.   Cardiovascular: Negative.   Gastrointestinal: Positive for heartburn, nausea, vomiting and diarrhea.  Genitourinary: Negative.   Musculoskeletal: Negative.   Skin: Negative.   Neurological: Negative.   Endo/Heme/Allergies: Negative.   Psychiatric/Behavioral: Positive for depression and substance abuse. The patient is nervous/anxious.     Blood pressure 111/76, pulse 102, temperature 99.3 F (37.4 C), temperature source Oral, resp. rate 18, height 5\' 10"  (1.778 m), weight 83.915 kg (185 lb).Body mass index is 26.54 kg/(m^2).  General Appearance: Casual  Eye Contact::  Fair  Speech:  Normal Rate  Volume:  Decreased  Mood:  Depressed and Irritable  Affect:  Congruent  Thought Process:  Coherent  Orientation:  Full (Time,  Place, and Person)  Thought Content:  WDL  Suicidal Thoughts:  No  Homicidal Thoughts:  No  Memory:  Immediate;   Fair Recent;   Fair Remote;   Fair  Judgement:  Fair  Insight:  Lacking  Psychomotor Activity:  Decreased  Concentration:  Fair  Recall:  Fair  Akathisia:  No  Handed:  Right  AIMS (if indicated):     Assets:  Resilience  Sleep:  Number of Hours: 6.25   Current Medications: Current Facility-Administered Medications  Medication Dose Route Frequency Provider Last Rate Last Dose  . acetaminophen (TYLENOL) tablet 650 mg  650 mg Oral Q6H PRN Shuvon Rankin, NP      . alum & mag hydroxide-simeth (MAALOX/MYLANTA) 200-200-20 MG/5ML suspension 30 mL  30 mL Oral Q4H PRN Shuvon Rankin, NP   30 mL at 05/27/12 0347  . chlordiazePOXIDE (LIBRIUM) capsule 25 mg  25 mg Oral Q6H PRN Shuvon Rankin, NP   25 mg at 05/26/12 2144  . chlordiazePOXIDE (LIBRIUM) capsule 25 mg  25 mg Oral BH-qamhs Shuvon Rankin, NP   25 mg at 05/27/12 0827   Followed by  . [START ON 05/28/2012] chlordiazePOXIDE (LIBRIUM) capsule 25 mg  25 mg Oral Daily Shuvon Rankin, NP      . hydrOXYzine (ATARAX/VISTARIL) tablet 25 mg  25 mg Oral Q6H PRN Shuvon Rankin, NP   25 mg at 05/27/12 0347  . ibuprofen (ADVIL,MOTRIN) tablet 600 mg  600 mg Oral QID PRN Jorje Guild, PA-C   600 mg at 05/26/12 1648  . loperamide (IMODIUM) capsule 2-4 mg  2-4 mg Oral PRN Shuvon Rankin, NP      . magnesium  hydroxide (MILK OF MAGNESIA) suspension 30 mL  30 mL Oral Daily PRN Shuvon Rankin, NP      . multivitamin with minerals tablet 1 tablet  1 tablet Oral Daily Shuvon Rankin, NP   1 tablet at 05/27/12 0827  . ondansetron (ZOFRAN-ODT) disintegrating tablet 4 mg  4 mg Oral Q6H PRN Shuvon Rankin, NP      . pantoprazole (PROTONIX) EC tablet 40 mg  40 mg Oral Daily Kerry Hough, PA-C   40 mg at 05/27/12 8119  . thiamine (VITAMIN B-1) tablet 100 mg  100 mg Oral Daily Shuvon Rankin, NP   100 mg at 05/27/12 0827  . traZODone (DESYREL) tablet 100 mg  100  mg Oral QHS Nehemiah Settle, MD   100 mg at 05/26/12 2144    Lab Results: No results found for this or any previous visit (from the past 48 hour(s)).  Physical Findings: AIMS: Facial and Oral Movements Muscles of Facial Expression: None, normal Lips and Perioral Area: None, normal Jaw: None, normal Tongue: None, normal,Extremity Movements Upper (arms, wrists, hands, fingers): None, normal Lower (legs, knees, ankles, toes): None, normal, Trunk Movements Neck, shoulders, hips: None, normal, Overall Severity Severity of abnormal movements (highest score from questions above): None, normal Incapacitation due to abnormal movements: None, normal Patient's awareness of abnormal movements (rate only patient's report): No Awareness, Dental Status Does patient usually wear dentures?: No  CIWA:  CIWA-Ar Total: 2 COWS:     Treatment Plan Summary: Daily contact with patient to assess and evaluate symptoms and progress in treatment Medication management  Plan:  Review of chart, vital signs, medications, and notes. 1-Individual and group therapy not encouraged at this time, until illness resolves 2-Medication management for depression and anxiety:  Medications reviewed with the patient and no untoward effects noted 3-Coping skills for depression, anxiety, and alcohol abuse 4-Continue crisis stabilization and management 5-Address health issues--monitoring vital signs, started HCTZ and potassium for elevated levels 6-Treatment plan in progress to prevent relapse of depression, anxiety, and substance abuse  Medical Decision Making Problem Points:  Established problem, stable/improving (1) and Review of psycho-social stressors (1) Data Points:  Review of medication regiment & side effects (2)  I certify that inpatient services furnished can reasonably be expected to improve the patient's condition.   Nanine Means, PMH-NP 05/27/2012, 1:58 PM

## 2012-05-27 NOTE — [facility] Suicide Risk Assessment (Signed)
[facility] INPATIENT:  Family/Significant Other Suicide Prevention Education  Suicide Prevention Education:  Education Completed; No one has been identified by the patient as the family member/significant other with whom the patient will be residing, and identified as the person(s) who will aid the patient in the event of a mental health crisis (suicidal ideations/suicide attempt).  With written consent from the patient, the family member/significant other has been provided the following suicide prevention education, prior to the and/or following the discharge of the patient.  The suicide prevention education provided includes the following:  Suicide risk factors  Suicide prevention and interventions  National Suicide Hotline telephone number  [facility] assessment telephone number  [facility] Emergency Assistance 911  [facility] and/or Residential Mobile Crisis Unit telephone number  Request made of family/significant other to:  Remove weapons (e.g., guns, rifles, knives), all items previously/currently identified as safety concern.    Remove drugs/medications (over-the-counter, prescriptions, illicit drugs), all items previously/currently identified as a safety concern.  The family member/significant other verbalizes understanding of the suicide prevention education information provided.  The family member/significant other agrees to remove the items of safety concern listed above. James [name] was not complaining of SI prior to admission, nor has he endorsed SI during his stay here.  SPE not needed.  [name] 05/27/2012, 10:16 AM

## 2012-05-27 NOTE — Clinical Social Work Note (Signed)
Met with patient to update dispositional plan.  Will call [facility] in AM to inquire about beds.  Back-up plan is [facility] on the 17th.

## 2012-05-27 NOTE — Progress Notes (Signed)
D: Patient appropriate and cooperative with staff and peers. Patient's affect/mood is depressed, but slightly anxious at times. He reported on the self inventory sheet that his sleep/appetite/ability to pay attention is poor and energy level is low. Patient rated depression "7" and feelings of hopelessness "5".  A: Support and encouragement provided to patient. Scheduled medications administered per MD orders. Maintain Q15 minute checks for safety.  R: Patient receptive. Denies SI/HI/AVH. Patient remains safe.

## 2012-05-27 NOTE — Progress Notes (Addendum)
Patient ID: James [name], male   DOB: [birth year], 52 y.o.   MRN: [id number] D: Pt. In doorway watching TV, reports depression at "6" of 10. "they tried to put a sign on my door cause I had spit up earlier but, I have acid reflux and hadn't had my Prilosec in three days" "I get like that when I don't have it." pt. Denies symptoms of diarrhea and vomiting. Reports started on reflux med and no problems. A: Writer introduces self to client, encouraged him to report any symptoms of Norovirus, staff will monitor q57min for safety. Writer noted history of elevated blood pressure, with no documented hx of hypertension, Clinical research associate staffed with Donell Sievert, PA received order for librium PRN (see MAR) as client has hx of ETOH abuse.  R: Pt. Is safe on the unit.

## 2012-05-28 MED ORDER — HYDROCHLOROTHIAZIDE 25 MG PO TABS: 25.0000 mg | ORAL_TABLET | Freq: Every day | ORAL | Status: DC

## 2012-05-28 MED ORDER — PANTOPRAZOLE SODIUM 40 MG PO TBEC: 40.0000 mg | DELAYED_RELEASE_TABLET | Freq: Every day | ORAL | Status: DC

## 2012-05-28 MED ORDER — TRAZODONE HCL 100 MG PO TABS: 100.0000 mg | ORAL_TABLET | Freq: Every day | ORAL | Status: DC

## 2012-05-28 NOTE — [facility] Suicide Risk Assessment (Signed)
Suicide Risk Assessment  Discharge Assessment     Demographic Factors:  Male, Caucasian and Low socioeconomic status  Mental Status Per Nursing Assessment::   On Admission:     Current Mental Status by Physician: Alert and oriented to 4. Patient AH/VH/SI/HI.  Loss Factors: Financial problems/change in socioeconomic status  Historical Factors: Impulsivity  Risk Reduction Factors:   Positive coping skills or problem solving skills  Continued Clinical Symptoms:  Alcohol/Substance Abuse/Dependencies  Cognitive Features That Contribute To Risk:  Closed-mindedness    Suicide Risk:  Minimal: No identifiable suicidal ideation.  Patients presenting with no risk factors but with morbid ruminations; may be classified as minimal risk based on the severity of the depressive symptoms  Discharge Diagnoses:   AXIS I:  Alcohol Abuse AXIS II:  Deferred AXIS III:   Past Medical History  Diagnosis Date  . GERD (gastroesophageal reflux disease)   . Melanoma    AXIS IV:  other psychosocial or environmental problems AXIS V:  61-70 mild symptoms  Plan Of Care/Follow-up recommendations:  Activity:  as tolerated Diet:  regular Follow up with outpatient appointments.  Is patient on multiple antipsychotic therapies at discharge:  No   Has Patient had three or more failed trials of antipsychotic monotherapy by history:  No  Recommended Plan for Multiple Antipsychotic Therapies: NA  RAVI, HIMABINDU 05/28/2012, 10:05 AM

## 2012-05-28 NOTE — Discharge Summary (Signed)
Physician Discharge Summary Note  Patient:  James [name] is an 52 y.o., male MRN:  [id number] DOB:  1960/09/10 Patient phone:  [phone] (home)  Patient address:   [address] [place] [address],   Date of Admission:  05/24/2012 Date of Discharge: 05/28/12  Reason for Admission:  ETOH Detox  Discharge Diagnoses: Active Problems:   Alcohol dependence  Review of Systems  Constitutional: Negative.   HENT: Negative.   Eyes: Negative.   Respiratory: Negative.   Cardiovascular: Negative.   Gastrointestinal: Negative.   Genitourinary: Negative.   Musculoskeletal: Negative.   Skin: Negative.   Neurological: Negative.   Endo/Heme/Allergies: Negative.   Psychiatric/Behavioral: Positive for depression and substance abuse. Negative for suicidal ideas, hallucinations and memory loss. The patient is not nervous/anxious and does not have insomnia.    Axis Diagnosis:   AXIS I:  Major Depression, Recurrent severe and Substance Abuse AXIS II:  Deferred AXIS III:   Past Medical History  Diagnosis Date  . GERD (gastroesophageal reflux disease)   . Melanoma    AXIS IV:  housing problems and other psychosocial or environmental problems AXIS V:  61-70 mild symptoms  Level of Care:  OP  Hospital Course:  Pt presented with alcohol detox request. Pt consumes 12pk to a case of beer per day. Has done so for years. Experienced 6 mths of sobriety in prison in 2008. Incarceration was for possession of THC. No aggressive or abusive hx per pt. No communicable diseases.  Pt reports he has never had detox except in prison. Pt has consuming alcohol since a teenager and drinks daily. Pt occasionally uses THC but denies use of other substances.  Pt found by police in his car in a parking lot passed out. Pt denies having memory on how he got there. Pt appears to be in normal range of intelligence and communicates well. Pt reports this incident has motivated him to get help. "I don't want to live  like this anymore. I need help."    The duration of stay was  days. The patient was seen and evaluated by the Treatment team consisting of Psychiatrist, NP-C, RN, Case Manager, and Therapist for evaluation and treatment plan with goal of stabilization upon discharge. The patient's physical and mental health problems were identified and treated appropriately.      Multiple modalities of treatment were used including medication, individual and group therapies, unit programming, improved nutrition, physical activity, and family sessions as needed. Patient completed the librium detox protocol during his admission. The MD ordered Trazodone 100 mg to help with insomnia and depression. The patient's blood pressure was elevated and Hydrodiuril 25 mg daily was initiated. Also Protonix EC 40 mg was ordered to help with complaints of acid reflux.      The symptoms of alcohol withdrawal were monitored daily by evaluation by clinical provider.  The patient's mental and emotional status was evaluated by a daily self inventory completed by the patient.      Improvement was demonstrated by declining numbers on the self assessment, improving vital signs, increased cognition, and improvement in mood, sleep, appetite as well as a reduction in physical symptoms.       The patient was evaluated and found to be stable enough for discharge and was released to home per the initial plan of treatment. The patient denied any SI or HI prior to his discharge.   Mental Status Exam:  For mental status exam please see mental status exam and  suicide risk  assessment completed by attending physician prior to discharge.      Consults:  None  Significant Diagnostic Studies:  labs: Chem profile,CBC,Urine Tox positive for opiates  Discharge Vitals:   Blood pressure 111/76, pulse 102, temperature 99.3 F (37.4 C), temperature source Oral, resp. rate 18, height 5\' 10"  (1.778 m), weight 83.915 kg (185 lb). Body mass index is 26.54  kg/(m^2). Lab Results:   No results found for this or any previous visit (from the past 72 hour(s)).  Physical Findings: AIMS: Facial and Oral Movements Muscles of Facial Expression: None, normal Lips and Perioral Area: None, normal Jaw: None, normal Tongue: None, normal,Extremity Movements Upper (arms, wrists, hands, fingers): None, normal Lower (legs, knees, ankles, toes): None, normal, Trunk Movements Neck, shoulders, hips: None, normal, Overall Severity Severity of abnormal movements (highest score from questions above): None, normal Incapacitation due to abnormal movements: None, normal Patient's awareness of abnormal movements (rate only patient's report): No Awareness, Dental Status Current problems with teeth and/or dentures?: No Does patient usually wear dentures?: No  CIWA:  CIWA-Ar Total: 1 COWS:     Psychiatric Specialty Exam: See Psychiatric Specialty Exam and Suicide Risk Assessment completed by Attending Physician prior to discharge.  Discharge destination:  [facility]  Is patient on multiple antipsychotic therapies at discharge:  No   Has Patient had three or more failed trials of antipsychotic monotherapy by history:  No  Recommended Plan for Multiple Antipsychotic Therapies: N/A  Discharge Orders   Future Orders Complete By Expires     Activity as tolerated - No restrictions  As directed         Medication List    TAKE these medications     Indication   hydrochlorothiazide 25 MG tablet  Commonly known as:  HYDRODIURIL  Take 1 tablet (25 mg total) by mouth daily.   Indication:  High Blood Pressure     ibuprofen 200 MG tablet  Commonly known as:  ADVIL,MOTRIN  Take 800 mg by mouth 2 (two) times daily as needed. For pain      pantoprazole 40 MG tablet  Commonly known as:  PROTONIX  Take 1 tablet (40 mg total) by mouth daily.   Indication:  Gastroesophageal Reflux Disease     traZODone 100 MG tablet  Commonly known as:  DESYREL  Take 1 tablet (100 mg  total) by mouth at bedtime.   Indication:  Trouble Sleeping, Major Depressive Disorder           Follow-up Information   Follow up with [facility]. (Call them on Friday to find out about bed availability)    Contact information:   [address]  [place]  [phone]      Follow up with [facility] rehab On 06/11/2012. (Arrive at 8AM sharp for your screening for admission)    Contact information:   [address]  [phone]      Follow up with [facility]. (Go to the walk-in clinic M-F between 8 and 9AM for your hospital follow up appointment.  You will need to go here before [facility] so that you have a supply of meds when you appear there.)    Contact information:   [address]  [place]  [phone]      Follow-up recommendations:  Activity:  As tolerated Diet:  Regular  Comments:  Take all your medications as prescribed by your mental healthcare provider.  Report any adverse effects and or reactions  from your medicines to your outpatient provider promptly.  Patient is instructed and cautioned to not engage in alcohol and or illegal drug use while on prescription medicines.  In the event of worsening symptoms, patient is instructed to call the crisis hotline, 911 and or go to the nearest ED for appropriate evaluation and treatment of symptoms.  Follow-up with your primary care provider for your other medical issues, concerns and or health care needs.      Total Discharge Time:  Greater than 30 minutes.  SignedFransisca Kaufmann ANN NP-C 05/28/2012, 12:17 PM

## 2012-05-28 NOTE — Progress Notes (Signed)
Discharge Note  D: Patient affect brighter today, as opposed to other previous shifts; his mood was appropriate to the circumstance. Patient verbalized that he was ready to go to [facility] in the morning. Patient c/o headache 6/10.    A: Support and encouragement provided to patient. Administered scheduled medications per ordering MD. PRN Tylenol given for pain. Discharge instructions/prescriptions/medication samples given to patient. Returned belongings to patient.  R: Patient receptive. Patient's headache decreased; 0/10. Denies SI/HI/AVH. Patient d/c without incident. Patient verbalized understanding of discharge instructions and prescriptions.

## 2012-05-28 NOTE — Progress Notes (Signed)
[facility] Adult Case Management Discharge Plan :  Will you be returning to the same living situation after discharge: Yes,  home At discharge, do you have transportation home?:Yes,  bus pass Do you have the ability to pay for your medications:Yes,  [facility] [facility]  Release of information consent forms completed and in the chart;  Patient's signature needed at discharge.  Patient to Follow up at: Follow-up Information   Follow up with [facility]. (Call them on Friday to find out about bed availability)    Contact information:   [address]  [place]  [phone]      Follow up with [facility] rehab On 06/11/2012. (Arrive at 8AM sharp for your screening for admission)    Contact information:   [address]  [phone]      Follow up with [facility]. (Go to the walk-in clinic M-F between 8 and 9AM for your hospital follow up appointment.  You will need to go here before [facility] so that you have a supply of meds when you appear there.)    Contact information:   [address]  [place]  [phone]      Patient denies SI/HI:   Yes,  yes    Safety Planning and Suicide Prevention discussed:  Yes,  yes  Ida Rogue 05/28/2012, 10:29 AM

## 2012-06-02 NOTE — Progress Notes (Signed)
Patient Discharge Instructions:  After Visit Summary (AVS): Faxed to: 06/02/12  Discharge Summary Note: Faxed to: 06/02/12  Psychiatric Admission Assessment Note: Faxed to: 06/02/12  Suicide Risk Assessment - Discharge Assessment: Faxed to: 06/02/12  Faxed/Sent to the Next Level Care provider: 06/02/12 Faxed to [facility] @ [phone] Faxed to [facility] @ [phone] Faxed to [facility] @ [phone]  Jerelene Redden, 06/02/2012, 3:14 PM

## 2012-06-10 ENCOUNTER — Ambulatory Visit: Payer: Self-pay | Admitting: Family Medicine

## 2012-06-10 ENCOUNTER — Ambulatory Visit: Payer: Self-pay

## 2012-06-10 VITALS — BP 131/83 | HR 97 | Temp 98.2°F | Resp 16 | Ht 72.0 in | Wt 191.0 lb

## 2012-06-10 DIAGNOSIS — IMO0001 Reserved for inherently not codable concepts without codable children: Secondary | ICD-10-CM

## 2012-06-10 DIAGNOSIS — F1010 Alcohol abuse, uncomplicated: Secondary | ICD-10-CM

## 2012-06-10 DIAGNOSIS — S81802A Unspecified open wound, left lower leg, initial encounter: Secondary | ICD-10-CM

## 2012-06-10 DIAGNOSIS — M79605 Pain in left leg: Secondary | ICD-10-CM

## 2012-06-10 DIAGNOSIS — S8012XA Contusion of left lower leg, initial encounter: Secondary | ICD-10-CM

## 2012-06-10 DIAGNOSIS — IMO0002 Reserved for concepts with insufficient information to code with codable children: Secondary | ICD-10-CM

## 2012-06-10 DIAGNOSIS — M79609 Pain in unspecified limb: Secondary | ICD-10-CM

## 2012-06-10 DIAGNOSIS — Z79899 Other long term (current) drug therapy: Secondary | ICD-10-CM

## 2012-06-10 DIAGNOSIS — S8010XA Contusion of unspecified lower leg, initial encounter: Secondary | ICD-10-CM

## 2012-06-10 LAB — COMPREHENSIVE METABOLIC PANEL
ALT: 44 U/L (ref 0–53)
AST: 63 U/L — ABNORMAL HIGH (ref 0–37)
Albumin: 4.6 g/dL (ref 3.5–5.2)
Alkaline Phosphatase: 70 U/L (ref 39–117)
BUN: 6 mg/dL (ref 6–23)
CO2: 24 mEq/L (ref 19–32)
Calcium: 9.7 mg/dL (ref 8.4–10.5)
Chloride: 94 mEq/L — ABNORMAL LOW (ref 96–112)
Creat: 0.82 mg/dL (ref 0.50–1.35)
Glucose, Bld: 87 mg/dL (ref 70–99)
Potassium: 3.7 mEq/L (ref 3.5–5.3)
Sodium: 131 mEq/L — ABNORMAL LOW (ref 135–145)
Total Bilirubin: 0.8 mg/dL (ref 0.3–1.2)
Total Protein: 7.6 g/dL (ref 6.0–8.3)

## 2012-06-10 MED ORDER — HYDROCODONE-ACETAMINOPHEN 5-325 MG PO TABS: 1.0000 | ORAL_TABLET | Freq: Four times a day (QID) | ORAL | Status: DC | PRN

## 2012-06-10 MED ORDER — CEPHALEXIN 500 MG PO CAPS: 500.0000 mg | ORAL_CAPSULE | Freq: Two times a day (BID) | ORAL | Status: DC

## 2012-06-10 NOTE — Patient Instructions (Signed)
Wash wound twice per day with soap and water, ace bandage, and elevate leg above level of heart when seated. Out of work next 2 days and recheck in office Friday morning. If any compartment syndrome symptoms as below, go to an emergency room immediately. No further advil for now. Hydrocodone if needed for pain, but should minimize amount of alcohol with this medicine as they both can cause sedation and affect your breathing.  Return to the clinic or go to the nearest emergency room if any of your symptoms worsen or new symptoms occur.  Compartment Syndrome Arms and legs are made up of muscles, nerves and blood vessels. These are wrapped in tough layers of tissue, called fascia. This creates separate spaces, or compartments. Sometimes, something goes wrong. Sometimes the problem is an injury. Swelling and pressure develop in a compartment. The pressure buildup is called compartment syndrome. This can become a serious problem very quickly. The fascia does not stretch very much. The pressure has no way to get out. In time, the pressure will push on whatever is inside the compartment. When a muscle in the compartment moves, you will feel severe pain. If pressure continues to increase, it can block the flow of blood in the smallest blood vessels, the capillaries. Then, the nerves and muscles in the compartment cannot get enough oxygen and nutrients (substances needed for survival). They will start to die. That is why the pressure needs to be relieved immediately.  CAUSES  Various things can lead to compartment syndrome. Possible causes include:  Injury. Some injuries can cause swelling or bleeding in a compartment. This can lead to compartment syndrome. Injuries that may cause this problem include:  Broken bones.  Crushing injuries.  Something punctures the skin and tissue underneath (penetrating injuries). An example is a knife wound.  Badly bruised muscles.  Poisonous bites (snake).  Severe  burns.  Blocked blood flow. This could result from:  A cast or bandage that is too tight.  A surgical procedure. Blood flow sometimes has to be stopped for a while during an operation, usually with a tourniquet.  Lying for too long in a position that restricts blood flow. This can happen in people who have nerve damage. They might not feel tingling or pain. It also can occur in people who are very drunk or who have overdosed on drugs.  Drugs used to build up muscles (anabolic steroids).  Drugs that keep the blood from forming clots (blood-thinners). SYMPTOMS  Compartment syndrome usually develops in the lower leg. It also can occur in the upper leg, arms, hands, feet and buttocks.   The most common symptom is pain:  Pain that gets worse when moving or stretching.  Pain that is worse than it should be for an injury.  Pain that comes along with a feeling of tingling or burning.  Pain that is worse when the area is pushed or squeezed.  Pain that is unaffected by pain medicine.  Other symptoms include:  A feeling of tightness or fullness.  A loss of feeling.  Weakness in the area.  Loss of movement.  Skin that becomes pale, tight and shiny over the painful area. DIAGNOSIS  To decide if you have compartment syndrome, your healthcare provider will probably:  Do a physical exam. This will include questions about any recent injury (trauma).  Measure the pressure inside the compartment. This is a very important test. A needle is put through the skin and into the compartment. The needle is attached  to a meter. The device will show if pressure is building. It also will show how high the pressure has gotten.  Order other tests, such as:  X-ray. This will show bone damage or a fracture.  Blood tests. They can tell if muscles are damaged.  Ultrasound. This test uses sounds waves to create a picture inside the painful area. TREATMENT  Compartment syndrome can be a surgical  emergency. It should be treated very quickly.   First-aid treatment is given first. This would include:  Promptly treating the injury.  Loosening or removing any cast, bandage, or external wrap that may be causing pain.  Raising the painful arm or leg to the same level as the heart.  Giving oxygen.  Giving fluid through a plastic tube that is put into a vein in the hand or arm (intravenous line or IV).  Surgery to treat compartment syndrome is call a fasciotomy. This is needed to relieve the pressure. It also can help prevent permanent damage.  A cut (incision) is made through the skin over the painful area.  Long incisions are made through the fascia. This relieves the pressure in the compartment.  The incisions may be left open. They would be covered with medicine and a bandage (dressing). After 2 to 3 days, the skin would be closed. Sometimes a special suction dressing called a VAC is applied for a few days to remove fluid before closing the wound.  Sometimes a skin graft is needed. Skin over the incision would be replaced with skin from another part of the body. PROGNOSIS  Serious problems can result from compartment syndrome. You might not be able to use the arm or leg like you once did. Sometimes feeling goes away. Severe cases could lead to loss of the limb. Identifying the condition early and treating it quickly can prevent most problems. Then, the outlook for a full recovery is very good. Document Released: 01/30/2009 Document Revised: 05/06/2011 Document Reviewed: 01/30/2009 [facility] Patient Information 2013 [place], [place].
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
## 2012-06-11 ENCOUNTER — Telehealth: Payer: Self-pay

## 2012-06-11 NOTE — Telephone Encounter (Signed)
   Notes Recorded by Watt Climes on 06/11/2012 at 2:23 PM Left message on machine to call back. ------  Notes Recorded by Shade Flood, MD on 06/11/2012 at 1:59 PM Call pt - one of liver tests slightly elevated, but better than few weeks ago. Kidney function ok. Sodium level was low and needs to be repeated to verify these results. Keep follow up tomorrow.   Patient advised, he was advised of need for follow up.

## 2012-06-11 NOTE — Telephone Encounter (Signed)
Pt has a missed call from here and was seen by Dr. Neva Seat yesterday he believes that someone may be calling because of that Call back number is [phone]

## 2012-06-12 ENCOUNTER — Ambulatory Visit: Payer: Self-pay

## 2012-06-14 ENCOUNTER — Ambulatory Visit (INDEPENDENT_AMBULATORY_CARE_PROVIDER_SITE_OTHER): Payer: Self-pay | Admitting: Emergency Medicine

## 2012-06-14 VITALS — BP 128/84 | HR 94 | Temp 97.9°F | Resp 16 | Ht 72.5 in | Wt 190.2 lb

## 2012-06-14 DIAGNOSIS — L03119 Cellulitis of unspecified part of limb: Secondary | ICD-10-CM

## 2012-06-14 DIAGNOSIS — L02419 Cutaneous abscess of limb, unspecified: Secondary | ICD-10-CM

## 2012-06-14 MED ORDER — DOXYCYCLINE HYCLATE 100 MG PO CAPS: 100.0000 mg | ORAL_CAPSULE | Freq: Two times a day (BID) | ORAL | Status: DC

## 2012-06-14 MED ORDER — OXYCODONE-ACETAMINOPHEN 5-325 MG PO TABS: 1.0000 | ORAL_TABLET | ORAL | Status: DC | PRN

## 2012-06-14 NOTE — Patient Instructions (Addendum)
Cellulitis Cellulitis is an infection of the skin and the tissue beneath it. The infected area is usually red and tender. Cellulitis occurs most often in the arms and lower legs.   CAUSES   Cellulitis is caused by bacteria that enter the skin through cracks or cuts in the skin. The most common types of bacteria that cause cellulitis are Staphylococcus and Streptococcus. SYMPTOMS    Redness and warmth.   Swelling.   Tenderness or pain.   Fever.  DIAGNOSIS  Your caregiver can usually determine what is wrong based on a physical exam. Blood tests may also be done. TREATMENT   Treatment usually involves taking an antibiotic medicine. HOME CARE INSTRUCTIONS    Take your antibiotics as directed. Finish them even if you start to feel better.   Keep the infected arm or leg elevated to reduce swelling.   Apply a warm cloth to the affected area up to 4 times per day to relieve pain.   Only take over-the-counter or prescription medicines for pain, discomfort, or fever as directed by your caregiver.   Keep all follow-up appointments as directed by your caregiver.  SEEK MEDICAL CARE IF:    You notice red streaks coming from the infected area.   Your red area gets larger or turns dark in color.   Your bone or joint underneath the infected area becomes painful after the skin has healed.   Your infection returns in the same area or another area.   You notice a swollen bump in the infected area.   You develop new symptoms.  SEEK IMMEDIATE MEDICAL CARE IF:    You have a fever.   You feel very sleepy.   You develop vomiting or diarrhea.   You have a general ill feeling (malaise) with muscle aches and pains.  MAKE SURE YOU:    Understand these instructions.   Will watch your condition.   Will get help right away if you are not doing well or get worse.  Document Released: 11/21/2004 Document Revised: 08/13/2011 Document Reviewed: 04/29/2011 [employer] Patient Information 2013  [employer].    
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
## 2012-06-23 ENCOUNTER — Ambulatory Visit (INDEPENDENT_AMBULATORY_CARE_PROVIDER_SITE_OTHER): Payer: Self-pay | Admitting: Emergency Medicine

## 2012-06-23 VITALS — BP 127/86 | HR 92 | Temp 98.2°F | Resp 18 | Ht 72.0 in | Wt 185.8 lb

## 2012-06-23 DIAGNOSIS — L03119 Cellulitis of unspecified part of limb: Secondary | ICD-10-CM

## 2012-06-23 DIAGNOSIS — L02419 Cutaneous abscess of limb, unspecified: Secondary | ICD-10-CM

## 2012-06-23 MED ORDER — OXYCODONE-ACETAMINOPHEN 5-325 MG PO TABS: 1.0000 | ORAL_TABLET | ORAL | Status: DC | PRN

## 2012-06-23 NOTE — Addendum Note (Signed)
Addended by: Elease Etienne A on: 06/23/2012 09:13 PM   Modules accepted: Orders

## 2012-06-23 NOTE — Progress Notes (Signed)
[facility] [address] [place] [address] [phone]  Date:  06/23/2012   Name:  James [name]   DOB:  January 31, 1961   MRN:  [id number]  PCP:  No PCP Per Patient    Chief Complaint: Wound Check   History of Present Illness:  James [name] is a 52 y.o. very pleasant male patient who presents with the following:  Follow up for wound on left lower leg with cellulitis, swelling. No drainage.  Pain with ambulation.  No fever or chills.  No nausea or vomiting. Taking antibiotics.  Denies other complaint or health concern today.   Patient Active Problem List   Diagnosis Date Noted  . Alcohol dependence 05/25/2012    Past Medical History  Diagnosis Date  . GERD (gastroesophageal reflux disease)   . Melanoma     Past Surgical History  Procedure Laterality Date  . Lung surgery      History  Substance Use Topics  . Smoking status: Former Games developer  . Smokeless tobacco: Not on file  . Alcohol Use: Yes     Comment: social    No family history on file.  Allergies  Allergen Reactions  . Sulfa Antibiotics     Reaction unknown    Medication list has been reviewed and updated.  Current Outpatient Prescriptions on File Prior to Visit  Medication Sig Dispense Refill  . cephALEXin (KEFLEX) 500 MG capsule Take 1 capsule (500 mg total) by mouth 2 (two) times daily.  20 capsule  0  . doxycycline (VIBRAMYCIN) 100 MG capsule Take 1 capsule (100 mg total) by mouth 2 (two) times daily.  20 capsule  0  . omeprazole (PRILOSEC) 20 MG capsule Take 20 mg by mouth daily.      [name] oxyCODONE-acetaminophen (ROXICET) 5-325 MG per tablet Take 1 tablet by mouth every 4 (four) hours as needed for pain.  30 tablet  0   No current facility-administered medications on file prior to visit.    Review of Systems:  As per HPI, otherwise negative.    Physical Examination: Filed Vitals:   06/23/12 2008  BP: 127/86  Pulse: 92  Temp: 98.2 F (36.8 C)  Resp: 18    Filed Vitals:   06/23/12 2008  Height: 6' (1.829 m)  Weight: 185 lb 12.8 oz (84.278 kg)   Body mass index is 25.19 kg/(m^2). Ideal Body Weight: Weight in (lb) to have BMI = 25: 183.9   GEN: WDWN, NAD, Non-toxic, Alert & Oriented x 3 HEENT: Atraumatic, Normocephalic.  Ears and Nose: No external deformity. EXTR: No clubbing/cyanosis/edema  Cellulitis surrounding a large crusted laceration anterior left shin.   NEURO: Normal gait.  PSYCH: Normally interactive. Conversant. Not depressed or anxious appearing.  Calm demeanor.    Assessment and Plan: Cellulitis left leg Continue antibiotics Follow up in one week  Signed,  Phillips Odor, MD

## 2012-06-23 NOTE — Patient Instructions (Signed)
Cellulitis Cellulitis is an infection of the skin and the tissue beneath it. The infected area is usually red and tender. Cellulitis occurs most often in the arms and lower legs.   CAUSES   Cellulitis is caused by bacteria that enter the skin through cracks or cuts in the skin. The most common types of bacteria that cause cellulitis are Staphylococcus and Streptococcus. SYMPTOMS    Redness and warmth.   Swelling.   Tenderness or pain.   Fever.  DIAGNOSIS  Your caregiver can usually determine what is wrong based on a physical exam. Blood tests may also be done. TREATMENT   Treatment usually involves taking an antibiotic medicine. HOME CARE INSTRUCTIONS    Take your antibiotics as directed. Finish them even if you start to feel better.   Keep the infected arm or leg elevated to reduce swelling.   Apply a warm cloth to the affected area up to 4 times per day to relieve pain.   Only take over-the-counter or prescription medicines for pain, discomfort, or fever as directed by your caregiver.   Keep all follow-up appointments as directed by your caregiver.  SEEK MEDICAL CARE IF:    You notice red streaks coming from the infected area.   Your red area gets larger or turns dark in color.   Your bone or joint underneath the infected area becomes painful after the skin has healed.   Your infection returns in the same area or another area.   You notice a swollen bump in the infected area.   You develop new symptoms.  SEEK IMMEDIATE MEDICAL CARE IF:    You have a fever.   You feel very sleepy.   You develop vomiting or diarrhea.   You have a general ill feeling (malaise) with muscle aches and pains.  MAKE SURE YOU:    Understand these instructions.   Will watch your condition.   Will get help right away if you are not doing well or get worse.  Document Released: 11/21/2004 Document Revised: 08/13/2011 Document Reviewed: 04/29/2011 Exit[employer] Patient Information 2013  [employer].    

## 2012-06-24 ENCOUNTER — Telehealth: Payer: Self-pay

## 2012-06-24 NOTE — Telephone Encounter (Signed)
Patient thought Dr. Dareen Piano was going to call in another round of antibiotics to [facility] on [address]   Please call [phone]

## 2012-06-24 NOTE — Telephone Encounter (Signed)
Spoke to Huntley Dec, she was working with Dr Dareen Piano yesterday. Pt  is to continue the antibiotics until he is done, does not need refill,. he is being referred to Ortho. Called patient to clarify. To you FYI

## 2012-07-09 ENCOUNTER — Other Ambulatory Visit: Payer: Self-pay | Admitting: *Deleted

## 2012-07-09 ENCOUNTER — Ambulatory Visit: Payer: Self-pay | Admitting: Emergency Medicine

## 2012-07-09 VITALS — BP 130/78 | HR 96 | Temp 98.6°F | Resp 16 | Ht 73.0 in | Wt 192.0 lb

## 2012-07-09 DIAGNOSIS — S91009A Unspecified open wound, unspecified ankle, initial encounter: Secondary | ICD-10-CM

## 2012-07-09 DIAGNOSIS — M79609 Pain in unspecified limb: Secondary | ICD-10-CM

## 2012-07-09 DIAGNOSIS — S81802A Unspecified open wound, left lower leg, initial encounter: Secondary | ICD-10-CM

## 2012-07-09 DIAGNOSIS — S81009A Unspecified open wound, unspecified knee, initial encounter: Secondary | ICD-10-CM

## 2012-07-09 DIAGNOSIS — M79605 Pain in left leg: Secondary | ICD-10-CM

## 2012-07-09 MED ORDER — DOXYCYCLINE HYCLATE 100 MG PO CAPS: 100.0000 mg | ORAL_CAPSULE | Freq: Two times a day (BID) | ORAL | Status: DC

## 2012-07-09 MED ORDER — HYDROCODONE-ACETAMINOPHEN 5-325 MG PO TABS: 1.0000 | ORAL_TABLET | Freq: Four times a day (QID) | ORAL | Status: DC | PRN

## 2012-07-09 MED ORDER — HYDROCODONE-ACETAMINOPHEN 5-325 MG PO TABS: ORAL_TABLET | ORAL | Status: DC
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
## 2012-07-10 NOTE — Progress Notes (Addendum)
Procedure:  Verbal consent obtained to do a wound revision and 2nd closure on a 4 wk old wound.  Risks vs benefits were discussed with patient.  Wound with local anesthesia 2% lido with epi and marcaine.  The wound was numbed multiple times due to length of procedure and most of the anesthesia ran out of wound.  Wound was irrigated >300cc normal saline. Multiple blood clots were removed during the procedure.  Dead tissue was debrided.  The wound edges were revised and shaped into an ellipse.  The 5cm wound was closed with 3-0 #3 vertical mattress sutures and #6 simple sutures.  Most of the sutures were done by Marylene Land FNP-student.  The skin was edematous which resulted in tight sutures with slight lower wound edge dog ear.  Wound was dressed.  Pt was instructed to elevate leg and ice to decrease ecchymosis due to intense pressure that was used to evacuated the blood clots.  Also it will help to decrease the edema to help with wound healing.  Pt to change the drsg daily and put no ointment on the wound.  Pt to recheck in 2-3 days. After irrigation and removal of large amount of blood clots.

## 2012-07-14 ENCOUNTER — Ambulatory Visit: Payer: Self-pay | Admitting: Family Medicine

## 2012-07-14 ENCOUNTER — Encounter (HOSPITAL_COMMUNITY): Payer: Self-pay | Admitting: Emergency Medicine

## 2012-07-14 ENCOUNTER — Inpatient Hospital Stay (HOSPITAL_COMMUNITY)
Admission: EM | Admit: 2012-07-14 | Discharge: 2012-07-15 | DRG: 603 | Disposition: A | Discharge status: Home / Self Care | Payer: MEDICAID | Attending: Internal Medicine | Admitting: Internal Medicine

## 2012-07-14 VITALS — BP 138/82 | HR 102 | Temp 98.3°F | Resp 16 | Ht 71.75 in | Wt 182.8 lb

## 2012-07-14 DIAGNOSIS — L03116 Cellulitis of left lower limb: Secondary | ICD-10-CM

## 2012-07-14 DIAGNOSIS — F1020 Alcohol dependence, uncomplicated: Secondary | ICD-10-CM | POA: Diagnosis present

## 2012-07-14 DIAGNOSIS — L03119 Cellulitis of unspecified part of limb: Secondary | ICD-10-CM

## 2012-07-14 DIAGNOSIS — L02419 Cutaneous abscess of limb, unspecified: Secondary | ICD-10-CM

## 2012-07-14 DIAGNOSIS — F17200 Nicotine dependence, unspecified, uncomplicated: Secondary | ICD-10-CM | POA: Diagnosis present

## 2012-07-14 DIAGNOSIS — K219 Gastro-esophageal reflux disease without esophagitis: Secondary | ICD-10-CM | POA: Diagnosis present

## 2012-07-14 DIAGNOSIS — L02416 Cutaneous abscess of left lower limb: Secondary | ICD-10-CM

## 2012-07-14 LAB — CBC WITH DIFFERENTIAL/PLATELET
Basophils Absolute: 0 10*3/uL (ref 0.0–0.1)
Basophils Relative: 1 % (ref 0–1)
Eosinophils Absolute: 0 10*3/uL (ref 0.0–0.7)
Eosinophils Relative: 0 % (ref 0–5)
HCT: 37.5 % — ABNORMAL LOW (ref 39.0–52.0)
Hemoglobin: 12.8 g/dL — ABNORMAL LOW (ref 13.0–17.0)
Lymphocytes Relative: 20 % (ref 12–46)
Lymphs Abs: 1 10*3/uL (ref 0.7–4.0)
MCH: 30.6 pg (ref 26.0–34.0)
MCHC: 34.1 g/dL (ref 30.0–36.0)
MCV: 89.7 fL (ref 78.0–100.0)
Monocytes Absolute: 0.7 10*3/uL (ref 0.1–1.0)
Monocytes Relative: 15 % — ABNORMAL HIGH (ref 3–12)
Neutro Abs: 3.3 10*3/uL (ref 1.7–7.7)
Neutrophils Relative %: 65 % (ref 43–77)
Platelets: 203 10*3/uL (ref 150–400)
RBC: 4.18 MIL/uL — ABNORMAL LOW (ref 4.22–5.81)
RDW: 13.9 % (ref 11.5–15.5)
WBC: 5.1 10*3/uL (ref 4.0–10.5)

## 2012-07-14 LAB — POCT CBC
Granulocyte percent: 71.6 %G (ref 37–80)
HCT, POC: 42.4 % — AB (ref 43.5–53.7)
Hemoglobin: 13.4 g/dL — AB (ref 14.1–18.1)
Lymph, poc: 1.2 (ref 0.6–3.4)
MCH, POC: 30.5 pg (ref 27–31.2)
MCHC: 31.6 g/dL — AB (ref 31.8–35.4)
MCV: 96.4 fL (ref 80–97)
MID (cbc): 0.5 (ref 0–0.9)
MPV: 9.4 fL (ref 0–99.8)
POC Granulocyte: 4.3 (ref 2–6.9)
POC LYMPH PERCENT: 19.9 %L (ref 10–50)
POC MID %: 8.5 %M (ref 0–12)
Platelet Count, POC: 238 10*3/uL (ref 142–424)
RBC: 4.4 M/uL — AB (ref 4.69–6.13)
RDW, POC: 15.7 %
WBC: 6 10*3/uL (ref 4.6–10.2)

## 2012-07-14 LAB — BASIC METABOLIC PANEL
BUN: 5 mg/dL — ABNORMAL LOW (ref 6–23)
CO2: 22 mEq/L (ref 19–32)
Calcium: 9 mg/dL (ref 8.4–10.5)
Chloride: 103 mEq/L (ref 96–112)
Creatinine, Ser: 0.71 mg/dL (ref 0.50–1.35)
GFR calc Af Amer: >90 mL/min (ref >90)
GFR calc non Af Amer: >90 mL/min (ref >90)
Glucose, Bld: 101 mg/dL — ABNORMAL HIGH (ref 70–99)
Potassium: 3.8 mEq/L (ref 3.5–5.1)
Sodium: 140 mEq/L (ref 135–145)

## 2012-07-14 MED ORDER — ALBUTEROL SULFATE (5 MG/ML) 0.5% IN NEBU: 2.5000 mg | INHALATION_SOLUTION | RESPIRATORY_TRACT | Status: DC | PRN

## 2012-07-14 MED ORDER — MORPHINE SULFATE 4 MG/ML IJ SOLN
6.0000 mg | Freq: Once | INTRAMUSCULAR | Status: AC
  Administered 2012-07-14: 6 mg via INTRAVENOUS
  Filled 2012-07-14: qty 2

## 2012-07-14 MED ORDER — VITAMIN B-1 100 MG PO TABS
100.0000 mg | ORAL_TABLET | Freq: Every day | ORAL | Status: DC
  Administered 2012-07-15: 100 mg via ORAL
  Filled 2012-07-14 (×2): qty 1

## 2012-07-14 MED ORDER — LORAZEPAM 2 MG/ML IJ SOLN: 1.0000 mg | Freq: Four times a day (QID) | INTRAMUSCULAR | Status: DC | PRN

## 2012-07-14 MED ORDER — FOLIC ACID 1 MG PO TABS
1.0000 mg | ORAL_TABLET | Freq: Every day | ORAL | Status: DC
  Administered 2012-07-14 – 2012-07-15 (×2): 1 mg via ORAL
  Filled 2012-07-14 (×2): qty 1

## 2012-07-14 MED ORDER — THIAMINE HCL 100 MG/ML IJ SOLN
100.0000 mg | Freq: Every day | INTRAMUSCULAR | Status: DC
  Administered 2012-07-14: 100 mg via INTRAVENOUS
  Filled 2012-07-14 (×2): qty 1

## 2012-07-14 MED ORDER — HYDROMORPHONE HCL PF 1 MG/ML IJ SOLN
0.5000 mg | INTRAMUSCULAR | Status: DC | PRN
  Administered 2012-07-14: 0.5 mg via INTRAVENOUS
  Filled 2012-07-14: qty 1

## 2012-07-14 MED ORDER — VANCOMYCIN HCL IN DEXTROSE 1-5 GM/200ML-% IV SOLN
1000.0000 mg | Freq: Three times a day (TID) | INTRAVENOUS | Status: DC
  Administered 2012-07-14 – 2012-07-15 (×4): 1000 mg via INTRAVENOUS
  Filled 2012-07-14 (×6): qty 200

## 2012-07-14 MED ORDER — LORAZEPAM 1 MG PO TABS
1.0000 mg | ORAL_TABLET | Freq: Four times a day (QID) | ORAL | Status: DC | PRN
  Administered 2012-07-14: 1 mg via ORAL
  Filled 2012-07-14: qty 1

## 2012-07-14 MED ORDER — IBUPROFEN 600 MG PO TABS
600.0000 mg | ORAL_TABLET | Freq: Three times a day (TID) | ORAL | Status: DC
  Administered 2012-07-14 – 2012-07-15 (×3): 600 mg via ORAL
  Filled 2012-07-14 (×4): qty 1

## 2012-07-14 MED ORDER — ACETAMINOPHEN 325 MG PO TABS: 650.0000 mg | ORAL_TABLET | Freq: Four times a day (QID) | ORAL | Status: DC | PRN

## 2012-07-14 MED ORDER — PANTOPRAZOLE SODIUM 40 MG PO TBEC
40.0000 mg | DELAYED_RELEASE_TABLET | Freq: Every day | ORAL | Status: DC
Start: 2012-07-15 — End: 2012-07-15
  Administered 2012-07-15: 40 mg via ORAL
  Filled 2012-07-14 (×2): qty 1

## 2012-07-14 MED ORDER — ENOXAPARIN SODIUM 40 MG/0.4ML ~~LOC~~ SOLN
40.0000 mg | SUBCUTANEOUS | Status: DC
  Administered 2012-07-14: 40 mg via SUBCUTANEOUS
  Filled 2012-07-14 (×2): qty 0.4

## 2012-07-14 MED ORDER — ACETAMINOPHEN 650 MG RE SUPP: 650.0000 mg | Freq: Four times a day (QID) | RECTAL | Status: DC | PRN

## 2012-07-14 MED ORDER — ADULT MULTIVITAMIN W/MINERALS CH
1.0000 | ORAL_TABLET | Freq: Every day | ORAL | Status: DC
  Administered 2012-07-14 – 2012-07-15 (×2): 1 via ORAL
  Filled 2012-07-14 (×2): qty 1

## 2012-07-14 MED ORDER — HYDROCODONE-ACETAMINOPHEN 5-325 MG PO TABS
1.0000 | ORAL_TABLET | ORAL | Status: DC | PRN
  Administered 2012-07-14 – 2012-07-15 (×4): 2 via ORAL
  Filled 2012-07-14 (×4): qty 2

## 2012-07-14 MED ORDER — ONDANSETRON HCL 4 MG PO TABS: 4.0000 mg | ORAL_TABLET | Freq: Four times a day (QID) | ORAL | Status: DC | PRN

## 2012-07-14 MED ORDER — HYDROMORPHONE HCL PF 1 MG/ML IJ SOLN
0.5000 mg | INTRAMUSCULAR | Status: DC | PRN
  Administered 2012-07-14 – 2012-07-15 (×3): 0.5 mg via INTRAVENOUS
  Filled 2012-07-14 (×4): qty 1

## 2012-07-14 MED ORDER — KETOROLAC TROMETHAMINE 30 MG/ML IJ SOLN
30.0000 mg | Freq: Once | INTRAMUSCULAR | Status: AC
  Administered 2012-07-14: 30 mg via INTRAVENOUS
  Filled 2012-07-14: qty 1

## 2012-07-14 MED ORDER — ONDANSETRON HCL 4 MG/2ML IJ SOLN: 4.0000 mg | Freq: Four times a day (QID) | INTRAMUSCULAR | Status: DC | PRN
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
## 2012-07-14 NOTE — ED Notes (Signed)
Notified Dr. Al Corpus that pt has been moved to the floor and would like pain medication. Ticket to ride send off completed.

## 2012-07-14 NOTE — Progress Notes (Signed)
UR Completed.  
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
## 2012-07-14 NOTE — Progress Notes (Signed)
[facility] ED CM consulted by [facility] Admission Rn who states pt voiced concern about a court case he missed attending today and a request from his lawyer requesting "proof' he is hospitalized. Cm recommended that the pt can complete a medical release form having his record sent/faxed to lawyer or may inquire if attending MD would state the pt is hospitalized.
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
## 2012-07-14 NOTE — Patient Instructions (Addendum)
Please proceed to the [facility] ER for further evaluation.  I will call and let them know that you are coming

## 2012-07-14 NOTE — Progress Notes (Signed)
[facility] has seen patient and provided him with an OC application.

## 2012-07-14 NOTE — ED Notes (Signed)
Red socks and yellow arm band placed on pt.
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
## 2012-07-15 LAB — CBC
HCT: 37.5 % — ABNORMAL LOW (ref 39.0–52.0)
Hemoglobin: 12.2 g/dL — ABNORMAL LOW (ref 13.0–17.0)
MCH: 29.1 pg (ref 26.0–34.0)
MCHC: 32.5 g/dL (ref 30.0–36.0)
MCV: 89.5 fL (ref 78.0–100.0)
Platelets: 177 10*3/uL (ref 150–400)
RBC: 4.19 MIL/uL — ABNORMAL LOW (ref 4.22–5.81)
RDW: 13.6 % (ref 11.5–15.5)
WBC: 3.5 10*3/uL — ABNORMAL LOW (ref 4.0–10.5)

## 2012-07-15 LAB — VANCOMYCIN, TROUGH: Vancomycin Tr: 11.3 ug/mL (ref 10.0–20.0)

## 2012-07-15 MED ORDER — CLINDAMYCIN HCL 300 MG PO CAPS
450.0000 mg | ORAL_CAPSULE | Freq: Three times a day (TID) | ORAL | Status: DC
  Administered 2012-07-15: 450 mg via ORAL
  Filled 2012-07-15 (×3): qty 1

## 2012-07-15 MED ORDER — CLINDAMYCIN HCL 150 MG PO CAPS: 450.0000 mg | ORAL_CAPSULE | Freq: Three times a day (TID) | ORAL | Status: DC

## 2012-07-15 MED ORDER — HYDROMORPHONE HCL PF 1 MG/ML IJ SOLN
1.0000 mg | INTRAMUSCULAR | Status: DC | PRN
  Administered 2012-07-15 (×2): 1 mg via INTRAVENOUS
  Filled 2012-07-15 (×2): qty 1

## 2012-07-15 MED ORDER — HYDROCODONE-ACETAMINOPHEN 5-325 MG PO TABS: ORAL_TABLET | ORAL | Status: DC

## 2012-07-15 NOTE — Progress Notes (Signed)
[facility] PROGRESS NOTE  James [name] OZH:[id number] DOB: 06-20-1960 DOA: 07/14/2012 PCP: No PCP Per Patient  HPI: 52 y.o. male with PMH of GERD, alcohol dependence and tobacco abuse was sent to the [facility] ED on 07/14/12, from urgent care due to worsening pain, swelling, redness and wound of left leg.  Assessment/Plan: Cellulitis and abscess of leg, left:  - s/p debridement in the ED, improved.  - transition iv Vanc to po Clinda today - consulted ortho to evaluate wound whether there is further need for surgical intervention.   Alcohol dependence: Placed on Ativan protocol and monitor.  Tobacco use disorder: Cessation counseled.  GERD: Continue PPI.   Code Status: Full Family Communication: none  Disposition Plan: home when medically ready, likely 1-2 days  Consultants:  Ortho surgery  Procedures:  none  Antibiotics:  Anti-infectives   Start     Dose/Rate Route Frequency Ordered Stop   07/15/12 1430  clindamycin (CLEOCIN) capsule 450 mg     450 mg Oral 3 times per day 07/15/12 1422     07/14/12 1200  vancomycin (VANCOCIN) IVPB 1000 mg/200 mL premix  Status:  Discontinued     1,000 mg 200 mL/hr over 60 Minutes Intravenous Every 8 hours 07/14/12 1050 07/15/12 1422     Antibiotics Given (last 72 hours)   None     HPI/Subjective: - complains of pain left leg, better than last night.   Objective: Filed Vitals:   07/14/12 1455 07/14/12 2251 07/15/12 0009 07/15/12 0617  BP: 155/97 150/106 160/92 143/92  Pulse: 89 60 62 60  Temp: 98.4 F (36.9 C) 98.9 F (37.2 C)  97.7 F (36.5 C)  TempSrc: Oral Axillary  Axillary  Resp: 18 14  18   Height: 6\' 2"  (1.88 m)     Weight: 83.326 kg (183 lb 11.2 oz)     SpO2: 99% 99%  100%    Intake/Output Summary (Last 24 hours) at 07/15/12 1423 Last data filed at 07/15/12 1147  Gross per 24 hour  Intake   1380 ml  Output   2725 ml  Net  -1345 ml   Filed Weights   07/14/12 1455  Weight: 83.326 kg (183  lb 11.2 oz)    Exam:   General:  NAD  Cardiovascular: regular rate and rhythm, without MRG  Respiratory: good air movement, clear to auscultation throughout, no wheezing, ronchi or rales  Abdomen: soft, not tender to palpation, positive bowel sounds  MSK: no peripheral edema, open wound ~2 inches wide, with 1 inch surrounding erythema, good granulation tissue  Neuro: CN 2-12 grossly intact, MS 5/5 in all 4  Data Reviewed: Basic Metabolic Panel:  Recent Labs Lab 07/14/12 1100  NA 140  K 3.8  CL 103  CO2 22  GLUCOSE 101*  BUN 5*  CREATININE 0.71  CALCIUM 9.0   CBC:  Recent Labs Lab 07/14/12 0814 07/14/12 1100 07/15/12 0357  WBC 6.0 5.1 3.5*  NEUTROABS  --  3.3  --   HGB 13.4* 12.8* 12.2*  HCT 42.4* 37.5* 37.5*  MCV 96.4 89.7 89.5  PLT  --  203 177    Recent Results (from the past 240 hour(s))  CULTURE, BLOOD (ROUTINE X 2)     Status: None   Collection Time    07/14/12 11:00 AM      Result Value Range Status   Specimen Description BLOOD LEFT ANTECUBITAL   Final   Special Requests BOTTLES DRAWN AEROBIC AND ANAEROBIC 2.5CC   Final  Culture  Setup Time 07/14/2012 14:24   Final   Culture     Final   Value:        BLOOD CULTURE RECEIVED NO GROWTH TO DATE CULTURE WILL BE HELD FOR 5 DAYS BEFORE ISSUING A FINAL NEGATIVE REPORT   Report Status PENDING   Incomplete  CULTURE, BLOOD (ROUTINE X 2)     Status: None   Collection Time    07/14/12 11:23 AM      Result Value Range Status   Specimen Description BLOOD RIGHT ANTECUBITAL   Final   Special Requests BOTTLES DRAWN AEROBIC AND ANAEROBIC 3CC   Final   Culture  Setup Time 07/14/2012 14:24   Final   Culture     Final   Value:        BLOOD CULTURE RECEIVED NO GROWTH TO DATE CULTURE WILL BE HELD FOR 5 DAYS BEFORE ISSUING A FINAL NEGATIVE REPORT   Report Status PENDING   Incomplete     Scheduled Meds: . clindamycin  450 mg Oral Q8H  . enoxaparin (LOVENOX) injection  40 mg Subcutaneous Q24H  . folic acid  1 mg  Oral Daily  . ibuprofen  600 mg Oral TID  . multivitamin with minerals  1 tablet Oral Daily  . pantoprazole  40 mg Oral Daily  . thiamine  100 mg Oral Daily   Continuous Infusions:   Principal Problem:   Cellulitis and abscess of leg, left Active Problems:   Alcohol dependence   Tobacco use disorder   GERD (gastroesophageal reflux disease)  Time spent: 25  Pamella Pert, MD [facility] Pager [phone]. If 7 PM - 7 AM, please contact night-coverage at [email or web address], password [facility] 07/15/2012, 2:23 PM  LOS: 1 day

## 2012-07-15 NOTE — Progress Notes (Signed)
Patient discharged home, all discharge medications and instructions reviewed and questions answered.  Patient refused wheelchair assistance to vehicle, states will ambulate.  
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
## 2012-07-15 NOTE — Consult Note (Signed)
NAME: James [name] MRN:   [id number] DOB:   Jan 22, 1961   CHIEF COMPLAINT:  Left Leg Pain  HISTORY:   James [name] a 52 y.o. male  with left  Leg wound.  He went to an Urgent Care where they washed and closed the wound.  It became red and began draining so he went to the ER yesterday morning.  PAST MEDICAL HISTORY:   Past Medical History  Diagnosis Date  . GERD (gastroesophageal reflux disease)   . Melanoma     PAST SURGICAL HISTORY:   Past Surgical History  Procedure Laterality Date  . Right armpit surgery      for melanoma    MEDICATIONS:   Medications Prior to Admission  Medication Sig Dispense Refill  . doxycycline (VIBRAMYCIN) 100 MG capsule Take 100 mg by mouth 2 (two) times daily. For 10 days. Started on 07-08-12      . HYDROcodone-acetaminophen (NORCO) 5-325 MG per tablet Take 1 to 2 tablets by mouth every 4 hours as needed for pain  15 tablet  0  . omeprazole (PRILOSEC) 20 MG capsule Take 20 mg by mouth daily.        ALLERGIES:   Allergies  Allergen Reactions  . Sulfa Antibiotics     Reaction unknown    REVIEW OF SYSTEMS:   Negative except current issue  FAMILY HISTORY:   Family History  Problem Relation Age of Onset  . Lung cancer Mother     SOCIAL HISTORY:   reports that he has been smoking.  He has never used smokeless tobacco. He reports that  drinks alcohol. He reports that he does not use illicit drugs.  PHYSICAL EXAM:  General appearance: alert   LEFT LEG:  3 x 4cm incision anteriorly; mild erythema around the edges; no purulence; good bed of granulation tissue.  LABORATORY STUDIES:  Recent Labs  07/14/12 1100 07/15/12 0357  WBC 5.1 3.5*  HGB 12.8* 12.2*  HCT 37.5* 37.5*  PLT 203 177     Recent Labs  07/14/12 1100  NA 140  K 3.8  CL 103  CO2 22  GLUCOSE 101*  BUN 5*  CREATININE 0.71  CALCIUM 9.0    STUDIES/RESULTS:  No results found.  ASSESSMENT: Left leg wound infection   PLAN: It is now ope and draining and  healing fine.  It does not need any surgery.  I recommend TID wet to dry dressing changes and a two week course of cipro or some other antibiotic with good tissue penetration.    LUCEY,STEPHEN D 07/15/2012. 12:09 PM
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
## 2012-07-20 LAB — CULTURE, BLOOD (ROUTINE X 2)
Culture: NO GROWTH
Culture: NO GROWTH

## 2013-08-13 ENCOUNTER — Encounter (HOSPITAL_COMMUNITY): Payer: Self-pay | Admitting: Emergency Medicine

## 2013-08-13 ENCOUNTER — Emergency Department (HOSPITAL_COMMUNITY)
Admission: EM | Admit: 2013-08-13 | Discharge: 2013-08-13 | Disposition: A | Discharge status: Home / Self Care | Payer: Self-pay | Attending: Emergency Medicine | Admitting: Emergency Medicine

## 2013-08-13 ENCOUNTER — Emergency Department (INDEPENDENT_AMBULATORY_CARE_PROVIDER_SITE_OTHER)
Admission: EM | Admit: 2013-08-13 | Discharge: 2013-08-13 | Disposition: A | Discharge status: Other Acute Inpatient Hospital | Payer: Self-pay | Source: Home / Self Care

## 2013-08-13 ENCOUNTER — Emergency Department (INDEPENDENT_AMBULATORY_CARE_PROVIDER_SITE_OTHER): Payer: Self-pay

## 2013-08-13 ENCOUNTER — Emergency Department (HOSPITAL_COMMUNITY): Payer: Self-pay

## 2013-08-13 DIAGNOSIS — R1084 Generalized abdominal pain: Secondary | ICD-10-CM

## 2013-08-13 DIAGNOSIS — R1900 Intra-abdominal and pelvic swelling, mass and lump, unspecified site: Secondary | ICD-10-CM

## 2013-08-13 DIAGNOSIS — Z79899 Other long term (current) drug therapy: Secondary | ICD-10-CM | POA: Insufficient documentation

## 2013-08-13 DIAGNOSIS — N281 Cyst of kidney, acquired: Secondary | ICD-10-CM | POA: Insufficient documentation

## 2013-08-13 DIAGNOSIS — K219 Gastro-esophageal reflux disease without esophagitis: Secondary | ICD-10-CM | POA: Insufficient documentation

## 2013-08-13 DIAGNOSIS — R079 Chest pain, unspecified: Secondary | ICD-10-CM

## 2013-08-13 DIAGNOSIS — N400 Enlarged prostate without lower urinary tract symptoms: Secondary | ICD-10-CM

## 2013-08-13 DIAGNOSIS — Z85820 Personal history of malignant melanoma of skin: Secondary | ICD-10-CM | POA: Insufficient documentation

## 2013-08-13 DIAGNOSIS — F17200 Nicotine dependence, unspecified, uncomplicated: Secondary | ICD-10-CM | POA: Insufficient documentation

## 2013-08-13 LAB — POCT I-STAT, CHEM 8
BUN: 3 mg/dL — ABNORMAL LOW (ref 6–23)
Calcium, Ion: 1.23 mmol/L (ref 1.12–1.23)
Chloride: 102 mEq/L (ref 96–112)
Creatinine, Ser: 0.9 mg/dL (ref 0.50–1.35)
Glucose, Bld: 114 mg/dL — ABNORMAL HIGH (ref 70–99)
HCT: 46 % (ref 39.0–52.0)
Hemoglobin: 15.6 g/dL (ref 13.0–17.0)
Potassium: 4.2 mEq/L (ref 3.7–5.3)
Sodium: 143 mEq/L (ref 137–147)
TCO2: 23 mmol/L (ref 0–100)

## 2013-08-13 LAB — CBC WITH DIFFERENTIAL/PLATELET
Basophils Absolute: 0 10*3/uL (ref 0.0–0.1)
Basophils Relative: 1 % (ref 0–1)
Eosinophils Absolute: 0.1 10*3/uL (ref 0.0–0.7)
Eosinophils Relative: 1 % (ref 0–5)
HCT: 39.6 % (ref 39.0–52.0)
Hemoglobin: 13.6 g/dL (ref 13.0–17.0)
Lymphocytes Relative: 29 % (ref 12–46)
Lymphs Abs: 1.6 10*3/uL (ref 0.7–4.0)
MCH: 32.2 pg (ref 26.0–34.0)
MCHC: 34.3 g/dL (ref 30.0–36.0)
MCV: 93.6 fL (ref 78.0–100.0)
Monocytes Absolute: 0.6 10*3/uL (ref 0.1–1.0)
Monocytes Relative: 10 % (ref 3–12)
Neutro Abs: 3.2 10*3/uL (ref 1.7–7.7)
Neutrophils Relative %: 59 % (ref 43–77)
Platelets: 212 10*3/uL (ref 150–400)
RBC: 4.23 MIL/uL (ref 4.22–5.81)
RDW: 12.5 % (ref 11.5–15.5)
WBC: 5.4 10*3/uL (ref 4.0–10.5)

## 2013-08-13 LAB — COMPREHENSIVE METABOLIC PANEL
ALT: 43 U/L (ref 0–53)
AST: 90 U/L — ABNORMAL HIGH (ref 0–37)
Albumin: 4 g/dL (ref 3.5–5.2)
Alkaline Phosphatase: 68 U/L (ref 39–117)
BUN: 6 mg/dL (ref 6–23)
CO2: 22 mEq/L (ref 19–32)
Calcium: 9.5 mg/dL (ref 8.4–10.5)
Chloride: 100 mEq/L (ref 96–112)
Creatinine, Ser: 0.86 mg/dL (ref 0.50–1.35)
GFR calc Af Amer: >90 mL/min (ref >90)
GFR calc non Af Amer: >90 mL/min (ref >90)
Glucose, Bld: 103 mg/dL — ABNORMAL HIGH (ref 70–99)
Potassium: 4.4 mEq/L (ref 3.7–5.3)
Sodium: 140 mEq/L (ref 137–147)
Total Bilirubin: 0.5 mg/dL (ref 0.3–1.2)
Total Protein: 8.3 g/dL (ref 6.0–8.3)

## 2013-08-13 LAB — LIPASE, BLOOD: Lipase: 262 U/L — ABNORMAL HIGH (ref 11–59)

## 2013-08-13 MED ORDER — OXYCODONE HCL 5 MG PO TABS: 5.0000 mg | ORAL_TABLET | Freq: Four times a day (QID) | ORAL | Status: DC | PRN

## 2013-08-13 MED ORDER — HYDROCODONE-ACETAMINOPHEN 5-325 MG PO TABS
1.0000 | ORAL_TABLET | Freq: Once | ORAL | Status: AC
  Administered 2013-08-13: 1 via ORAL

## 2013-08-13 MED ORDER — HYDROCODONE-ACETAMINOPHEN 5-325 MG PO TABS
ORAL_TABLET | ORAL | Status: AC
  Filled 2013-08-13: qty 1

## 2013-08-13 MED ORDER — IOHEXOL 300 MG/ML  SOLN
100.0000 mL | Freq: Once | INTRAMUSCULAR | Status: AC | PRN
  Administered 2013-08-13: 100 mL via INTRAVENOUS

## 2013-08-13 MED ORDER — ONDANSETRON HCL 4 MG/2ML IJ SOLN
4.0000 mg | Freq: Once | INTRAMUSCULAR | Status: AC
  Administered 2013-08-13: 4 mg via INTRAVENOUS
  Filled 2013-08-13: qty 2

## 2013-08-13 MED ORDER — MORPHINE SULFATE 4 MG/ML IJ SOLN
6.0000 mg | Freq: Once | INTRAMUSCULAR | Status: AC
  Administered 2013-08-13: 6 mg via INTRAVENOUS
  Filled 2013-08-13: qty 2

## 2013-08-13 NOTE — ED Provider Notes (Signed)
CSN: [id number]     Arrival date & time 08/13/13  1616 History   First MD Initiated Contact with Patient 08/13/13 1629     Chief Complaint  Patient presents with  . Abdominal Pain     (Consider location/radiation/quality/duration/timing/severity/associated sxs/prior Treatment) Patient is a 53 y.o. male presenting with abdominal pain. The history is provided by the patient and medical records.  Abdominal Pain Pain location:  RUQ Pain quality: aching, pressure and sharp   Pain radiates to:  Does not radiate Pain severity:  No pain Onset quality:  Gradual Duration:  2 weeks Timing:  Constant Progression:  Worsening Chronicity:  New Relieved by:  Nothing Associated symptoms: no anorexia, no chest pain, no chills, no constipation, no cough, no diarrhea, no dysuria, no fever, no hematuria, no nausea, no shortness of breath, no sore throat and no vomiting     Past Medical History  Diagnosis Date  . GERD (gastroesophageal reflux disease)   . Melanoma    Past Surgical History  Procedure Laterality Date  . Right armpit surgery      for melanoma   Family History  Problem Relation Age of Onset  . Lung cancer Mother    History  Substance Use Topics  . Smoking status: Current Every Day Smoker -- 0.25 packs/day  . Smokeless tobacco: Never Used  . Alcohol Use: Yes     Comment: social    Review of Systems  Constitutional: Negative for fever and chills.  HENT: Negative for congestion, rhinorrhea and sore throat.   Eyes: Negative for pain.  Respiratory: Negative for cough and shortness of breath.   Cardiovascular: Negative for chest pain and palpitations.  Gastrointestinal: Positive for abdominal pain. Negative for nausea, vomiting, diarrhea, constipation and anorexia.  Endocrine: Negative for polydipsia and polyuria.  Genitourinary: Negative for dysuria, hematuria and flank pain.  Musculoskeletal: Negative for back pain and neck pain.  Skin: Negative for color change and wound.   Neurological: Negative for dizziness, numbness and headaches.      Allergies  Sulfa antibiotics  Home Medications   Prior to Admission medications   Medication Sig Start Date End Date Taking? Authorizing Provider  Aspirin-Acetaminophen-Caffeine (GOODY HEADACHE PO) Take 1 packet by mouth 3 (three) times daily as needed (arthritis pain).   Yes Historical Provider, MD  ibuprofen (ADVIL,MOTRIN) 200 MG tablet Take 800 mg by mouth daily. For rheumatoid arthritis   Yes Historical Provider, MD  omeprazole (PRILOSEC OTC) 20 MG tablet Take 20 mg by mouth daily.   Yes Historical Provider, MD  oxyCODONE (ROXICODONE) 5 MG immediate release tablet Take 1 tablet (5 mg total) by mouth every 6 (six) hours as needed for severe pain. 08/13/13   Merrily Pew, MD   BP 153/96  Pulse 69  Temp(Src) 98.1 F (36.7 C) (Oral)  Resp 17  Wt 200 lb (90.719 kg)  SpO2 97% Physical Exam  Nursing note and vitals reviewed. Constitutional: He is oriented to person, place, and time. He appears well-developed and well-nourished.  HENT:  Head: Normocephalic and atraumatic.  Eyes: Conjunctivae and EOM are normal. Pupils are equal, round, and reactive to light.  Neck: Normal range of motion.  Cardiovascular: Normal rate and regular rhythm.   Pulmonary/Chest: Effort normal and breath sounds normal.  Abdominal: Soft. He exhibits no distension. There is no tenderness.  Musculoskeletal: Normal range of motion. He exhibits no edema and no tenderness.  Neurological: He is alert and oriented to person, place, and time.  Skin: Skin is warm and  dry.    ED Course  Procedures (including critical care time) Labs Review Labs Reviewed  COMPREHENSIVE METABOLIC PANEL - Abnormal; Notable for the following:    Glucose, Bld 103 (*)    AST 90 (*)    All other components within normal limits  LIPASE, BLOOD - Abnormal; Notable for the following:    Lipase 262 (*)    All other components within normal limits  CBC WITH  DIFFERENTIAL    Imaging Review Dg Abd 1 View  08/13/2013   ADDENDUM REPORT: 08/13/2013 15:54  ADDENDUM: These results were called by telephone at the time of interpretation on 08/13/2013 at 3:54 PM to Dr. Lynne Leader , who verbally acknowledged these results.   Electronically Signed   By: Rolm Baptise M.D.   On: 08/13/2013 15:54   08/13/2013   CLINICAL DATA:  Stomach pain.  EXAM: ABDOMEN - 1 VIEW  COMPARISON:  05/24/2012  FINDINGS: Large rounded soft tissue mass noted within the left abdomen. This is of unknown etiology. This warrants further evaluation with contrast-enhanced CT.  Nonobstructive bowel gas pattern. No free air or suspicious calcification. No acute bony abnormality.  IMPRESSION: Larger rounded mass within the left abdomen of unknown etiology. Recommend further evaluation with CT of the abdomen and pelvis with oral and IV contrast.  Electronically Signed: By: Rolm Baptise M.D. On: 08/13/2013 15:39   Ct Abdomen Pelvis W Contrast  08/13/2013   CLINICAL DATA:  ABDOMINAL PAIN  EXAM: CT ABDOMEN AND PELVIS WITH CONTRAST  TECHNIQUE: Multidetector CT imaging of the abdomen and pelvis was performed using the standard protocol following bolus administration of intravenous contrast.  CONTRAST:  112mL OMNIPAQUE IOHEXOL 300 MG/ML  SOLN  COMPARISON:  Abdominal 08/10/2013, and 05/24/2012  FINDINGS: Minimal scarring versus atelectasis within the lung bases. Minimal coronary artery calcifications.  The liver, spleen, adrenals, pancreas, right kidney are unremarkable.  Along the lateral portion of the left kidney a large nonenhancing 17 x 13 x 14 cm cyst is appreciated containing small punctate calcifications along the wall. The left kidney is otherwise unremarkable.  The bowel is negative. Appendix is identified and negative. Small to moderate amount of fecal matter in the colon.  No abdominal pelvic free fluid, loculated fluid collections, further masses, nor adenopathy. Gallbladder fossa is unremarkable. There  is no evidence of an abdominal aortic aneurysm.  No abdominal wall nor inguinal hernia. There no aggressive appearing osseous lesions.  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 This correlates to findings on plain film radiograph.  Minimal scarring versus atelectasis within the lung bases.  No further CT evidence of abdominal or pelvic pathology.   Electronically Signed   By: Margaree Mackintosh M.D.   On: 08/13/2013 18:56     EKG Interpretation None      MDM   Final diagnoses:  Renal cyst    Pertinent positives and negatives from above HPI, ROS and PE include: 53 yo M here with 2 weeks of worsening abdominal pain in ruq. Acutely worsened over last 2 days. No associated symptoms aside from some intermittent dark stools. No brbpr, fevers. Exam with no ttp in abdomen. Hemoccult done at urgent care and negative.  Likely soft tissue mass in xr at urgent care. Will get ct here and give IV pain meds. Will dispo accordingly.   On re-evaluation patients VS were stable/CT with evidence of cyst on kidney. Spoke with urology and these are mostly non op but  would need follow up within a week or two. Patient updated. Alcohol cessation counseling provided, not interested in quitting at this time. Tolerating PO, doubt severe pancreatitis at this time, possibly related to mass (compression) v alcohol use. Patient informed of this finding and will return if worsening abdominal pain, not tolerating PO or other symptoms.   Discharge instructions, including strict return precautions for new or worsening symptoms, given. Patient and/or family verbalized understanding and agreement with the plan as described.   Labs, studies and imaging reviewed by myself and considered in medical decision making if ordered. Imaging interpreted by radiology. Pt was discussed with my attending, Dr. Rogene Houston.  Discharge Medication List as of 08/13/2013  8:19 PM    START taking  these medications   Details  oxyCODONE (ROXICODONE) 5 MG immediate release tablet Take 1 tablet (5 mg total) by mouth every 6 (six) hours as needed for severe pain., Starting 08/13/2013, Until Discontinued, Print        Follow-up Information   Follow up with Claybon Jabs, MD. Schedule an appointment as soon as possible for a visit in 1 week. (with someone in his office for renal cyst)    Specialty:  Urology   Contact information:   [place] [address] [phone]          Merrily Pew, MD 08/14/13 1229

## 2013-08-13 NOTE — ED Notes (Signed)
Pt in from urgent care, went there c/o abd pain and cramping, an xray there indicated a large mass in his abdomen and was sent here for a CT scan

## 2013-08-13 NOTE — ED Provider Notes (Signed)
CSN: [id number]     Arrival date & time 08/13/13  1259 History   First MD Initiated Contact with Patient 08/13/13 1436     Chief Complaint  Patient presents with  . Bloated   (Consider location/radiation/quality/duration/timing/severity/associated sxs/prior Treatment) HPI Comments: 53 year old male is complaining of generalized cramping abdominal discomfort steadily for one week. Occurs every day. Denies vomiting, diarrhea or constipation. He states the worst pain is across the upper abdomen. He states he had 2-3 days of black stools but not recently. Also states that he has had pain across his lower chest as well as shortness of breath for one week. He consumes 12 pack of beer daily. He denies smoking however PMH states that he is a current daily smoker.    Past Medical History  Diagnosis Date  . GERD (gastroesophageal reflux disease)   . Melanoma    Past Surgical History  Procedure Laterality Date  . Right armpit surgery      for melanoma   Family History  Problem Relation Age of Onset  . Lung cancer Mother    History  Substance Use Topics  . Smoking status: Current Every Day Smoker -- 0.25 packs/day  . Smokeless tobacco: Never Used  . Alcohol Use: Yes     Comment: social    Review of Systems  Constitutional: Negative for fever, chills, activity change and fatigue.  HENT: Negative.   Respiratory: Positive for shortness of breath. Negative for cough and wheezing.   Cardiovascular: Positive for chest pain. Negative for leg swelling.  Gastrointestinal: Positive for abdominal pain. Negative for nausea, vomiting, diarrhea, constipation, blood in stool, abdominal distention and rectal pain.  Genitourinary: Negative.   Musculoskeletal: Negative.   Skin: Negative for pallor and rash.  Neurological: Negative for dizziness, syncope and headaches.    Allergies  Sulfa antibiotics  Home Medications   Prior to Admission medications   Medication Sig Start Date End Date  Taking? Authorizing Provider  omeprazole (PRILOSEC) 20 MG capsule Take 20 mg by mouth daily.   Yes Historical Provider, MD  clindamycin (CLEOCIN) 150 MG capsule Take 3 capsules (450 mg total) by mouth every 8 (eight) hours. For 12 more days 07/15/12   Caren Griffins, MD  HYDROcodone-acetaminophen (NORCO) 5-325 MG per tablet Take 1 to 2 tablets by mouth every 4 hours as needed for pain 07/15/12   Caren Griffins, MD   BP 137/85  Pulse 75  Temp(Src) 97.9 F (36.6 C) (Oral)  Resp 16  SpO2 97% Physical Exam  Nursing note and vitals reviewed. Constitutional: He is oriented to person, place, and time. He appears well-developed and well-nourished. No distress.  Neck: Normal range of motion. Neck supple.  Cardiovascular: Normal rate and normal heart sounds.   Pulmonary/Chest: Effort normal and breath sounds normal. No respiratory distress. He has no wheezes. He has no rales.  Mild tachypnea with a respiratory rate of 28.  Abdominal: Soft. He exhibits no distension and no mass. There is tenderness. There is guarding. There is no rebound.  Various areas of tympany in the upper abdomen and dullness in the lower abdomen.  Genitourinary: Guaiac negative stool.    DRE: Normal sphincter. Prostate 2-3+ enlarged, nontender. Brown stool in rectal vault. Guaiac negative.  Musculoskeletal: He exhibits no edema and no tenderness.  Neurological: He is alert and oriented to person, place, and time. He exhibits normal muscle tone.  Skin: Skin is warm and dry.  Psychiatric: He has a normal mood and affect.  ED Course  Procedures (including critical care time) Labs Review Labs Reviewed  POCT I-STAT, CHEM 8 - Abnormal; Notable for the following:    BUN 3 (*)    Glucose, Bld 114 (*)    All other components within normal limits    Imaging Review Dg Abd 1 View  08/13/2013   ADDENDUM REPORT: 08/13/2013 15:54  ADDENDUM: These results were called by telephone at the time of interpretation on 08/13/2013 at  3:54 PM to Dr. Lynne Leader , who verbally acknowledged these results.   Electronically Signed   By: Rolm Baptise M.D.   On: 08/13/2013 15:54   08/13/2013   CLINICAL DATA:  Stomach pain.  EXAM: ABDOMEN - 1 VIEW  COMPARISON:  05/24/2012  FINDINGS: Large rounded soft tissue mass noted within the left abdomen. This is of unknown etiology. This warrants further evaluation with contrast-enhanced CT.  Nonobstructive bowel gas pattern. No free air or suspicious calcification. No acute bony abnormality.  IMPRESSION: Larger rounded mass within the left abdomen of unknown etiology. Recommend further evaluation with CT of the abdomen and pelvis with oral and IV contrast.  Electronically Signed: By: Rolm Baptise M.D. On: 08/13/2013 15:39     MDM   1. Generalized abdominal pain   2. Abdominal mass   3. Enlarged prostate on rectal examination   4. Chest pain, unspecified chest pain type     Transfer to [facility] for evaluation of left hemiabdominal mass assoc with abdominal pain.    Janne Napoleon, NP 08/13/13 [phone]

## 2013-08-13 NOTE — Discharge Instructions (Signed)
You have a large Renal Cyst as the likely cause of your problems. A cyst is a fluid filled structure. We are unsure of the fluid in the cyst. We recommend you follow up with urology for this cyst. If you have sudden worsening of pain, not able to tolerate PO or other new symptoms please be seen by ED again.     Emergency Department Resource Guide 1) Find a Doctor and Pay Out of Pocket Although you won't have to find out who is covered by your insurance plan, it is a good idea to ask around and get recommendations. You will then need to call the office and see if the doctor you have chosen will accept you as a new patient and what types of options they offer for patients who are self-pay. Some doctors offer discounts or will set up payment plans for their patients who do not have insurance, but you will need to ask so you aren't surprised when you get to your appointment.  2) Contact Your Local Health Department Not all health departments have doctors that can see patients for sick visits, but many do, so it is worth a call to see if yours does. If you don't know where your local health department is, you can check in your phone book. The CDC also has a tool to help you locate your state's health department, and many state websites also have listings of all of their local health departments.  3) Find a [facility] If your illness is not likely to be very severe or complicated, you may want to try a walk in clinic. These are popping up all over the country in pharmacies, drugstores, and shopping centers. They're usually staffed by nurse practitioners or physician assistants that have been trained to treat common illnesses and complaints. They're usually fairly quick and inexpensive. However, if you have serious medical issues or chronic medical problems, these are probably not your best option.  No Primary Care Doctor: - Call Health Connect at  [phone] - they can help you locate a primary care  doctor that  accepts your insurance, provides certain services, etc. - Physician Referral Service- [phone]  Chronic Pain Problems: Organization         Address  Phone   Notes  [facility]  [phone] Patients need to be referred by their primary care doctor.   Medication Assistance: Organization         Address  Phone   Notes  [facility] Medication [facility] [address] [place], [place] [address] [phone] --Must be a resident of [facility] -- Must have NO insurance coverage whatsoever (no Medicaid/ Medicare, etc.) -- The pt. MUST have a primary care doctor that directs their care regularly and follows them in the community   MedAssist  [phone]   [employer]  [phone]    Agencies that provide inexpensive medical care: Organization         Address  Phone   Notes  [place]  [phone]   [facility]    [phone]   [facility] [place], [place] [address] [phone]   [place] [address], [place] [phone]   Planned Parenthood    [phone]   [facility]    [phone]   [place] and [place], [place]  Phone:  [phone], Fax:  [phone] Hours of Operation:  9 am - 6 pm, M-F.  Also accepts Medicaid/Medicare and self-pay.  [facility] for [place], [address], [place] Phone: [phone], Fax: [phone]. Hours of Operation:  8:30 am - 5:30 pm, M-F.  Also accepts Medicaid and self-pay.  [facility] [address] [place] Phone: [phone]   [place], [place], [place] [phone], Ext. [phone] Mondays & Thursdays: 7-9 AM.  First 15 patients are seen on a first come, first serve basis.    [place]  Providers:  Organization         Address  Phone   Notes  [facility] [address], [place] [phone] Also accepts self-pay patients.  [facility] [address], [place]  [phone]   [place], [address], [place] [phone]   [facility] [address], [place] [phone]   Lucianne Lei [address], [place]   [phone] Only accepts [place] Access [place] patients after they have their name applied to their card.   Self-Pay (no insurance) in [facility]:  Organization         Address  Phone   Notes  Sickle Cell Patients, [facility] [place] [phone]   [facility] Urgent Care [place] [phone]   [facility] [place]  [place], [place], [place] [phone]   [facility]/Dr. Osei-Bonsu  [address] [place] or [address], [place] [phone] Phone number for both [place] and [place] locations is the same.  [facility] [address] [place] [phone]   [facility] [address], [place] or [address] [phone]   [facility] [address], [place] [phone], phone; [phone], fax Sees patients 1st and 3rd Saturday of every month.  Must not qualify for public or private insurance (i.e. Medicaid, Medicare, [place] Health Choice, Veterans' Benefits)  Household income should be no more than 200% of the poverty level The clinic cannot treat you if you are pregnant or think you are pregnant  Sexually transmitted diseases are not treated at the clinic.    Dental Care: Organization         Address  Phone  Notes  [facility] Department of [facility] [place] [phone] Accepts children up to age 17 who are enrolled in [place] or [place]; pregnant women with a Medicaid card; and children who have applied for Medicaid or [place] Health Choice, but were declined, whose parents can pay a reduced fee at time of service.  [facility] Department of [facility]  [address], [place] [phone] Accepts children up to age 79 who are enrolled in [place] or [place]; pregnant women with a Medicaid card; and children who have applied for Medicaid or [place] Health Choice, but were declined, whose parents can pay a reduced fee at time of service.  [place] Adult Dental Access PROGRAM  [place] [phone] Patients are seen by appointment  only. Walk-ins are not accepted. [place] will see patients 53 years of age and older. Monday - Tuesday (8am-5pm) Most Wednesdays (8:30-5pm) $30 per visit, cash only  [facility] Adult Dental Access PROGRAM  [address], [facility] [phone] Patients are seen by appointment only. Walk-ins are not accepted. [place] will see patients 51 years of age and older. One Wednesday Evening (Monthly: Volunteer Based).  $30 per visit, cash only  [place]  [phone] for adults; Children under age 19, call Graduate Pediatric Dentistry at [phone]. Children aged 39-14, please call [phone] to request a pediatric application.  Dental services are provided in all areas of dental care including fillings, crowns and bridges, complete and partial dentures, implants, gum treatment, root canals, and extractions. Preventive care is also provided. Treatment is provided to both adults and children. Patients are selected via a lottery and there is often a waiting list.   [facility] [address], [place]  [phone] [email or web address]   [facility]  [address] [place], [place] [phone], Ext. [phone] Second and Fourth Thursday of each month, opens at 6:30 AM; Clinic ends at 9 AM.  Patients are seen on a first-come first-served basis, and a limited number are seen during each clinic.   [facility]  [address] Hillard Danker [place], [place] [phone]   Eligibility Requirements You must have lived in [place], [place], or [place] counties for at least the last three months.   You cannot be eligible for state or federal sponsored [employer], including [employer], [place], or [employer].   You generally cannot be eligible for healthcare insurance through your employer.    How to apply: Eligibility screenings are held every Tuesday and Wednesday afternoon from 1:00 pm until 4:00 pm. You do not need an appointment for the interview!  [facility] [address] [place], [place]   Dent  [place] Department  [place]  [phone]    Behavioral Health Resources in the Community: Intensive Outpatient Programs Organization         Address  Phone  Notes  [place] Adwolf. [address] [place], [place] [phone]   [facility] [address], [place], [place]   ADS: Alcohol & Drug Svcs [address], [place], [place]   [place] [address],  [place], [place] or [phone]   Substance Abuse Resources Organization         Address  Phone  Notes  Alcohol and Drug Services  [phone]   [place]  [phone]   [facility] [place]   Chinita Pester  [phone]   Residential & Outpatient Substance Abuse Program  [phone]   Psychological Services Organization         Address  Phone  Notes  [facility] [place]  [place]  [phone]   [place] [address], [place] [phone] or [phone]    Mobile Crisis Teams Organization         Address  Phone  Notes  [facility], Mobile Crisis Care Unit  [phone]   [facility]  [address] [place], [place]   [facility] [address], [place] [phone]  Self-Help/Support Groups Organization         Address  Phone             Notes  Mental Health Assoc. of [place] - variety of support groups  [place] Call for more information  Narcotics Anonymous (NA), [facility] [address], [employer] [place]  2 meetings at this location   Special educational needs teacher         Address  Phone  Notes  ASAP Residential Treatment [place],    [place]  [phone]   [facility]  [address], [place] [phone], [place], [place]   [place], [place] [phone] Admissions: 8am-3pm M-F  Incentives Substance [place] [address]    [place], [place] [phone]   [facility] [address] [place], [place], [place]   [facility] [address]  [place], [place]   [facility]ight Programs - Intensive Outpatient [address] Kristeen [address], [place], [place]   [facility] ([place].) [place].,  [place], [place] [phone] or [phone]   [facility] Services (RTS) [address], [place], [place] Accepts Medicaid  [facility] [address],  [place] [phone] Substance Abuse/Addiction Treatment   [facility] Organization         Address  Phone  Notes  [facility]  [phone]   Domenic Schwab, PhD [address] Arlis Porta [place], [place]   [phone] or [phone]    [place] Doniphan [place], [place] [phone]   [facility] [address], [place], [place] [phone] Insurance/Medicaid/sponsorship through [facility] and Families [address], [address] [place]                                    [place], [place] [phone] [place] [address] [place] [address], [place] [phone]    Dr. Adele Schilder  [phone]   [facility] Dept. 1) [address] [place] 2) [place] 3)  [place] [address], [place] [phone]  [phone]   [place] [phone] or [phone] (After Hours)

## 2013-08-13 NOTE — ED Notes (Addendum)
Spoke to Dr. Dayna Barker about plan of care. Reported BP of 154/100, previous diastolic reading of 122.  Patient also has urine sample available if urology requests. MD acknowledges, no new orders received.

## 2013-08-13 NOTE — ED Provider Notes (Signed)
I saw and evaluated the patient, reviewed the resident's note and I agree with the findings and plan.   EKG Interpretation None      Patient seen by me. Patient sent from urgent care regular x-ray of the abdomen showed a large mass on the left side. Sent here for CT scan. CT scan shows a large renal cyst discussed with urology patient had noticed this mass himself. Causing some discomfort but nothing severe. In addition did have an elevation of his lipase probably has some mild pancreatitis we'll treat that with some clear liquids pain medicine as needed followup with urology and also followup for the pancreatitis CT scan otherwise negative.   Results for orders placed during the hospital encounter of 08/13/13  CBC WITH DIFFERENTIAL      Result Value Ref Range   WBC 5.4  4.0 - 10.5 K/uL   RBC 4.23  4.22 - 5.81 MIL/uL   Hemoglobin 13.6  13.0 - 17.0 g/dL   HCT 39.6  39.0 - 52.0 %   MCV 93.6  78.0 - 100.0 fL   MCH 32.2  26.0 - 34.0 pg   MCHC 34.3  30.0 - 36.0 g/dL   RDW 12.5  11.5 - 15.5 %   Platelets 212  150 - 400 K/uL   Neutrophils Relative % 59  43 - 77 %   Neutro Abs 3.2  1.7 - 7.7 K/uL   Lymphocytes Relative 29  12 - 46 %   Lymphs Abs 1.6  0.7 - 4.0 K/uL   Monocytes Relative 10  3 - 12 %   Monocytes Absolute 0.6  0.1 - 1.0 K/uL   Eosinophils Relative 1  0 - 5 %   Eosinophils Absolute 0.1  0.0 - 0.7 K/uL   Basophils Relative 1  0 - 1 %   Basophils Absolute 0.0  0.0 - 0.1 K/uL  COMPREHENSIVE METABOLIC PANEL      Result Value Ref Range   Sodium 140  137 - 147 mEq/L   Potassium 4.4  3.7 - 5.3 mEq/L   Chloride 100  96 - 112 mEq/L   CO2 22  19 - 32 mEq/L   Glucose, Bld 103 (*) 70 - 99 mg/dL   BUN 6  6 - 23 mg/dL   Creatinine, Ser 0.86  0.50 - 1.35 mg/dL   Calcium 9.5  8.4 - 10.5 mg/dL   Total Protein 8.3  6.0 - 8.3 g/dL   Albumin 4.0  3.5 - 5.2 g/dL   AST 90 (*) 0 - 37 U/L   ALT 43  0 - 53 U/L   Alkaline Phosphatase 68  39 - 117 U/L   Total Bilirubin 0.5  0.3 - 1.2 mg/dL    GFR calc non Af Amer >90  >90 mL/min   GFR calc Af Amer >90  >90 mL/min  LIPASE, BLOOD      Result Value Ref Range   Lipase 262 (*) 11 - 59 U/L   Dg Abd 1 View  08/13/2013   ADDENDUM REPORT: 08/13/2013 15:54  ADDENDUM: These results were called by telephone at the time of interpretation on 08/13/2013 at 3:54 PM to Dr. Lynne Leader , who verbally acknowledged these results.   Electronically Signed   By: Rolm Baptise M.D.   On: 08/13/2013 15:54   08/13/2013   CLINICAL DATA:  Stomach pain.  EXAM: ABDOMEN - 1 VIEW  COMPARISON:  05/24/2012  FINDINGS: Large rounded soft tissue mass noted within the left abdomen. This  is of unknown etiology. This warrants further evaluation with contrast-enhanced CT.  Nonobstructive bowel gas pattern. No free air or suspicious calcification. No acute bony abnormality.  IMPRESSION: Larger rounded mass within the left abdomen of unknown etiology. Recommend further evaluation with CT of the abdomen and pelvis with oral and IV contrast.  Electronically Signed: By: Rolm Baptise M.D. On: 08/13/2013 15:39   Ct Abdomen Pelvis W Contrast  08/13/2013   CLINICAL DATA:  ABDOMINAL PAIN  EXAM: CT ABDOMEN AND PELVIS WITH CONTRAST  TECHNIQUE: Multidetector CT imaging of the abdomen and pelvis was performed using the standard protocol following bolus administration of intravenous contrast.  CONTRAST:  138mL OMNIPAQUE IOHEXOL 300 MG/ML  SOLN  COMPARISON:  Abdominal 08/10/2013, and 05/24/2012  FINDINGS: Minimal scarring versus atelectasis within the lung bases. Minimal coronary artery calcifications.  The liver, spleen, adrenals, pancreas, right kidney are unremarkable.  Along the lateral portion of the left kidney a large nonenhancing 17 x 13 x 14 cm cyst is appreciated containing small punctate calcifications along the wall. The left kidney is otherwise unremarkable.  The bowel is negative. Appendix is identified and negative. Small to moderate amount of fecal matter in the colon.  No abdominal  pelvic free fluid, loculated fluid collections, further masses, nor adenopathy. Gallbladder fossa is unremarkable. There is no evidence of an abdominal aortic aneurysm.  No abdominal wall nor inguinal hernia. There no aggressive appearing osseous lesions.  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 This correlates to findings on plain film radiograph.  Minimal scarring versus atelectasis within the lung bases.  No further CT evidence of abdominal or pelvic pathology.   Electronically Signed   By: Margaree Mackintosh M.D.   On: 08/13/2013 18:56      Fredia Sorrow, MD 08/13/13 1954

## 2013-08-13 NOTE — ED Notes (Signed)
Patient states over the past 2 weeks, he has cut back from his usual 12 beers a day, since he has felt nauseated all the time, and had had abdominal pain . No longer using his 800 mg of motrin every day for his RA in wrists, since he dont want to cause himself any problems. Has only been able to hold down Gatorade. C/o abdominal area has had a near constant cramping across . Reports past couple of days has been having dark/black stools

## 2013-08-13 NOTE — ED Provider Notes (Signed)
Medical screening examination/treatment/procedure(s) were performed by resident physician or non-physician practitioner and as supervising physician I was immediately available for consultation/collaboration.   Pauline Good MD.   Billy Fischer, MD 08/13/13 [phone]

## 2013-08-18 ENCOUNTER — Other Ambulatory Visit (HOSPITAL_COMMUNITY): Payer: Self-pay | Admitting: Urology

## 2013-08-18 DIAGNOSIS — N281 Cyst of kidney, acquired: Secondary | ICD-10-CM

## 2013-08-19 ENCOUNTER — Other Ambulatory Visit: Payer: Self-pay | Admitting: Radiology

## 2013-08-20 ENCOUNTER — Ambulatory Visit (HOSPITAL_COMMUNITY)
Admission: RE | Admit: 2013-08-20 | Discharge: 2013-08-20 | Disposition: A | Discharge status: Home / Self Care | Payer: Self-pay | Source: Ambulatory Visit | Attending: Urology | Admitting: Urology

## 2013-08-20 ENCOUNTER — Other Ambulatory Visit (HOSPITAL_COMMUNITY): Payer: Self-pay | Admitting: Urology

## 2013-08-20 ENCOUNTER — Inpatient Hospital Stay (HOSPITAL_COMMUNITY): Admission: RE | Admit: 2013-08-20 | Payer: Self-pay | Source: Ambulatory Visit

## 2013-08-20 ENCOUNTER — Encounter (HOSPITAL_COMMUNITY): Payer: Self-pay

## 2013-08-20 VITALS — BP 147/87 | HR 98 | Temp 98.1°F | Resp 20 | Ht 73.0 in | Wt 198.0 lb

## 2013-08-20 DIAGNOSIS — N281 Cyst of kidney, acquired: Secondary | ICD-10-CM

## 2013-08-20 LAB — CBC
HCT: 41.4 % (ref 39.0–52.0)
Hemoglobin: 14.4 g/dL (ref 13.0–17.0)
MCH: 32.4 pg (ref 26.0–34.0)
MCHC: 34.8 g/dL (ref 30.0–36.0)
MCV: 93 fL (ref 78.0–100.0)
Platelets: 228 10*3/uL (ref 150–400)
RBC: 4.45 MIL/uL (ref 4.22–5.81)
RDW: 12.4 % (ref 11.5–15.5)
WBC: 3.5 10*3/uL — ABNORMAL LOW (ref 4.0–10.5)

## 2013-08-20 LAB — PROTIME-INR
INR: 1.18 (ref 0.00–1.49)
Prothrombin Time: 15 seconds (ref 11.6–15.2)

## 2013-08-20 LAB — APTT: aPTT: 30 seconds (ref 24–37)

## 2013-08-20 MED ORDER — FENTANYL CITRATE 0.05 MG/ML IJ SOLN
INTRAMUSCULAR | Status: AC
  Filled 2013-08-20: qty 4

## 2013-08-20 MED ORDER — FENTANYL CITRATE 0.05 MG/ML IJ SOLN
INTRAMUSCULAR | Status: AC | PRN
  Administered 2013-08-20 (×2): 50 ug via INTRAVENOUS

## 2013-08-20 MED ORDER — MIDAZOLAM HCL 2 MG/2ML IJ SOLN
INTRAMUSCULAR | Status: AC
  Filled 2013-08-20: qty 6

## 2013-08-20 MED ORDER — ONDANSETRON HCL 4 MG/2ML IJ SOLN
4.0000 mg | Freq: Once | INTRAMUSCULAR | Status: AC
  Administered 2013-08-20: 4 mg via INTRAVENOUS

## 2013-08-20 MED ORDER — LIDOCAINE HCL 1 % IJ SOLN
INTRAMUSCULAR | Status: DC
Start: 2013-08-20 — End: 2013-08-20
  Filled 2013-08-20: qty 10

## 2013-08-20 MED ORDER — SODIUM CHLORIDE 0.9 % IV SOLN: Freq: Once | INTRAVENOUS | Status: DC

## 2013-08-20 MED ORDER — ONDANSETRON HCL 4 MG/2ML IJ SOLN
INTRAMUSCULAR | Status: AC
  Filled 2013-08-20: qty 2

## 2013-08-20 MED ORDER — MIDAZOLAM HCL 2 MG/2ML IJ SOLN
INTRAMUSCULAR | Status: AC | PRN
  Administered 2013-08-20 (×2): 2 mg via INTRAVENOUS

## 2013-08-20 NOTE — Sedation Documentation (Addendum)
680 cc liquid drainage returned from aspiration- bloody, dark

## 2013-08-20 NOTE — H&P (Signed)
James [name] is an 53 y.o. male.   Chief Complaint: Developed abd pain few weeks Presented to ED CT revealed L sided mass CT shows large left renal cyst--referred to Urology Pt now scheduled for aspiration  HPI: Hx skin ca; smoker; GERD  Past Medical History  Diagnosis Date  . GERD (gastroesophageal reflux disease)   . Melanoma     Past Surgical History  Procedure Laterality Date  . Right armpit surgery      for melanoma    Family History  Problem Relation Age of Onset  . Lung cancer Mother    Social History:  reports that he has been smoking.  He has never used smokeless tobacco. He reports that he drinks alcohol. He reports that he does not use illicit drugs.  Allergies:  Allergies  Allergen Reactions  . Sulfa Antibiotics Other (See Comments)    Childhood reaction     (Not in a hospital admission)  Results for orders placed during the hospital encounter of 08/20/13 (from the past 48 hour(s))  APTT     Status: None   Collection Time    08/20/13  9:46 AM      Result Value Ref Range   aPTT 30  24 - 37 seconds  CBC     Status: Abnormal   Collection Time    08/20/13  9:46 AM      Result Value Ref Range   WBC 3.5 (*) 4.0 - 10.5 K/uL   RBC 4.45  4.22 - 5.81 MIL/uL   Hemoglobin 14.4  13.0 - 17.0 g/dL   HCT 41.4  39.0 - 52.0 %   MCV 93.0  78.0 - 100.0 fL   MCH 32.4  26.0 - 34.0 pg   MCHC 34.8  30.0 - 36.0 g/dL   RDW 12.4  11.5 - 15.5 %   Platelets 228  150 - 400 K/uL  PROTIME-INR     Status: None   Collection Time    08/20/13  9:46 AM      Result Value Ref Range   Prothrombin Time 15.0  11.6 - 15.2 seconds   INR 1.18  0.00 - 1.49   No results found.  Review of Systems  Constitutional: Negative for fever and weight loss.  Respiratory: Negative for shortness of breath.   Cardiovascular: Negative for chest pain.  Gastrointestinal: Positive for abdominal pain. Negative for nausea and vomiting.  Musculoskeletal: Positive for back pain.  Neurological:  Negative for weakness.  Psychiatric/Behavioral: Positive for substance abuse.       ETOH and smoker    Blood pressure 165/105, pulse 84, temperature 98.1 F (36.7 C), temperature source Oral, resp. rate 20, height 6\' 1"  (1.854 m), weight 89.812 kg (198 lb), SpO2 98.00%. Physical Exam  Constitutional: He is oriented to person, place, and time. He appears well-nourished.  Cardiovascular: Normal rate and regular rhythm.   Respiratory: Effort normal and breath sounds normal. He has no wheezes.  GI: Soft. Bowel sounds are normal. There is no tenderness.  Musculoskeletal: Normal range of motion.  Neurological: He is alert and oriented to person, place, and time.  Skin: Skin is warm and dry.  Psychiatric: He has a normal mood and affect. His behavior is normal. Judgment and thought content normal.     Assessment/Plan abd pain for weeks Abn xray and CT shows Large L renal cyst Pt scheduled for aspiration per Dr Risa Grill Pt and family aware of procedure beenfits and risks and agreeable to proceed Consent signed  and in chart  [place] A 08/20/2013, 11:03 AM

## 2013-08-20 NOTE — Procedures (Signed)
Successful [place] left renal cyst drainage 680cc brown fluid aspirated No comp Stable cyto sent Full report in PACS

## 2014-05-25 IMAGING — CR DG CERVICAL SPINE COMPLETE 4+V
6 series · 6 of 6 positions shown · non-contrast
Comparison: None.

CLINICAL DATA: Neck pain after fall.

CERVICAL SPINE - COMPLETE 4+ VIEW

[w cervical spine lat]
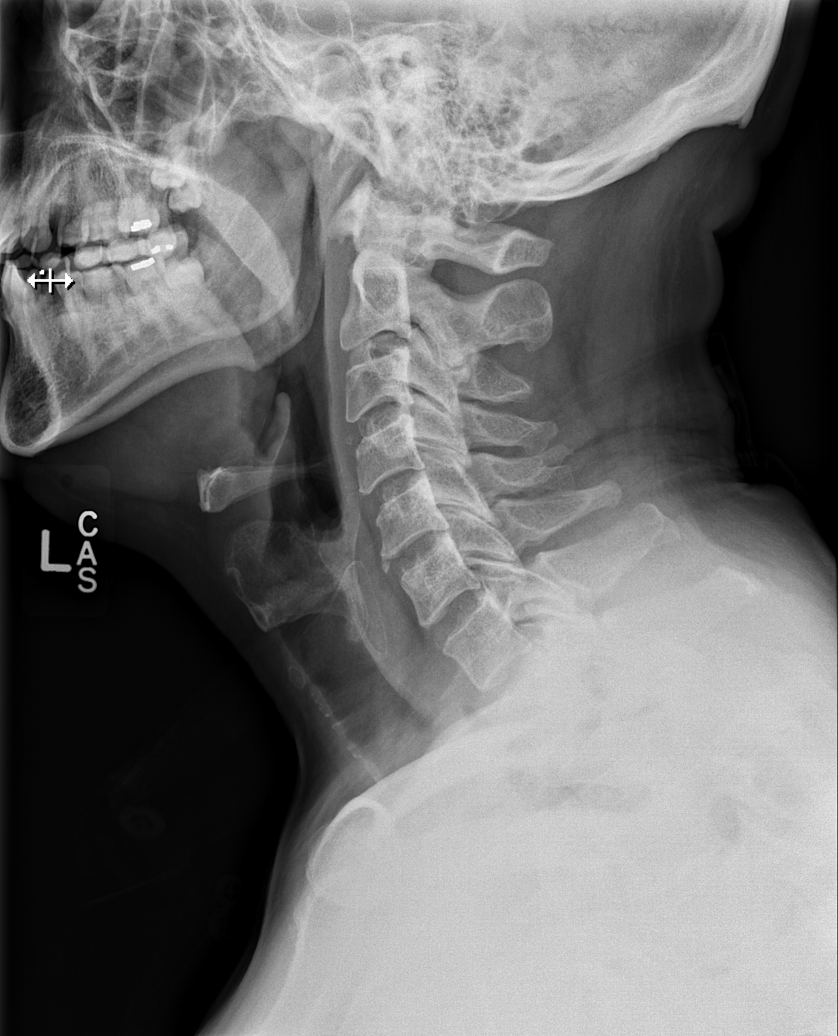

[w cervical spine ap_obl (1 of 2)]
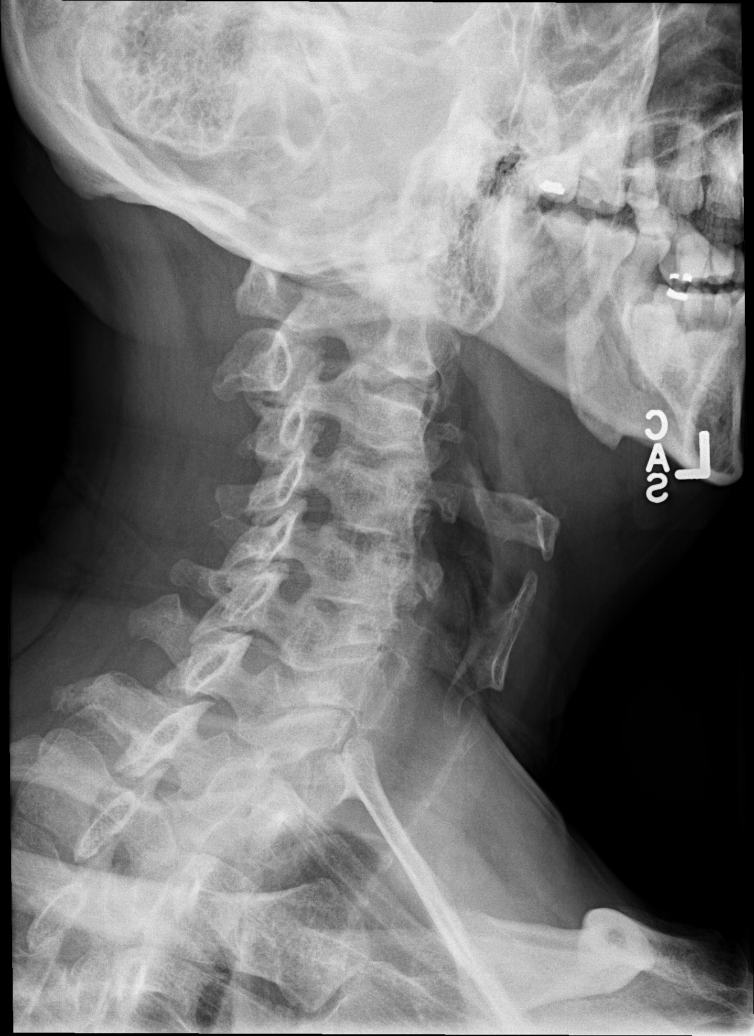

[w cervical spine ap_obl (2 of 2)]
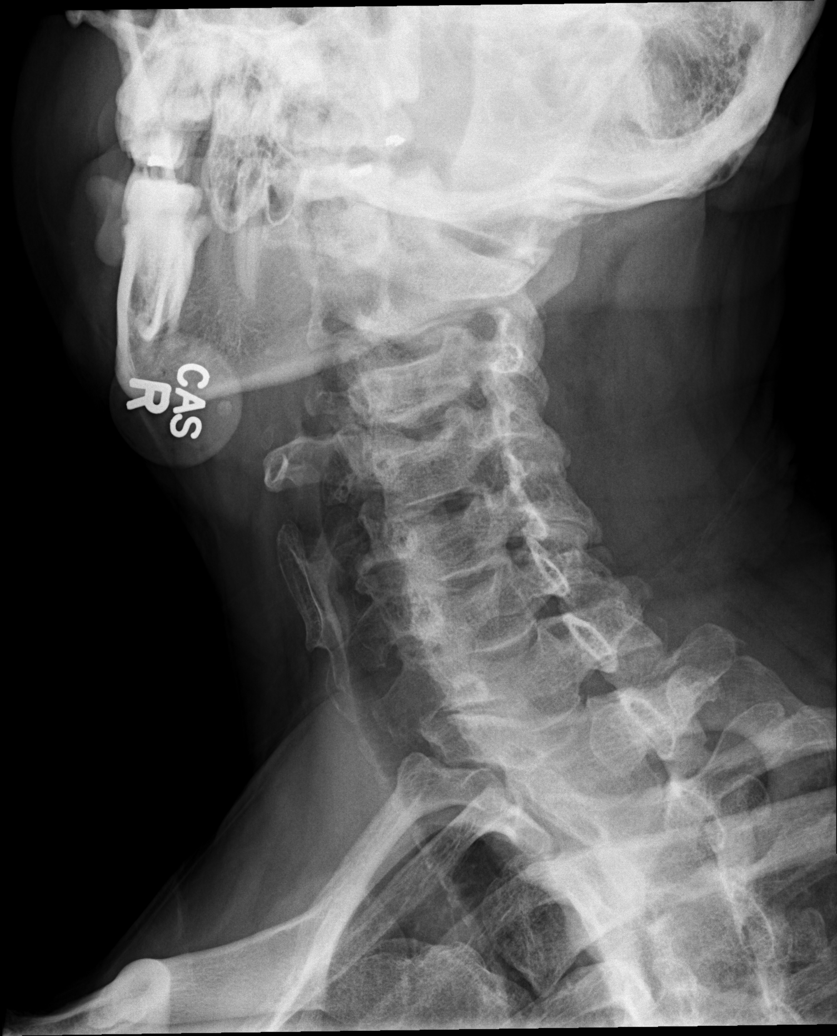

[w cervical spine ap]
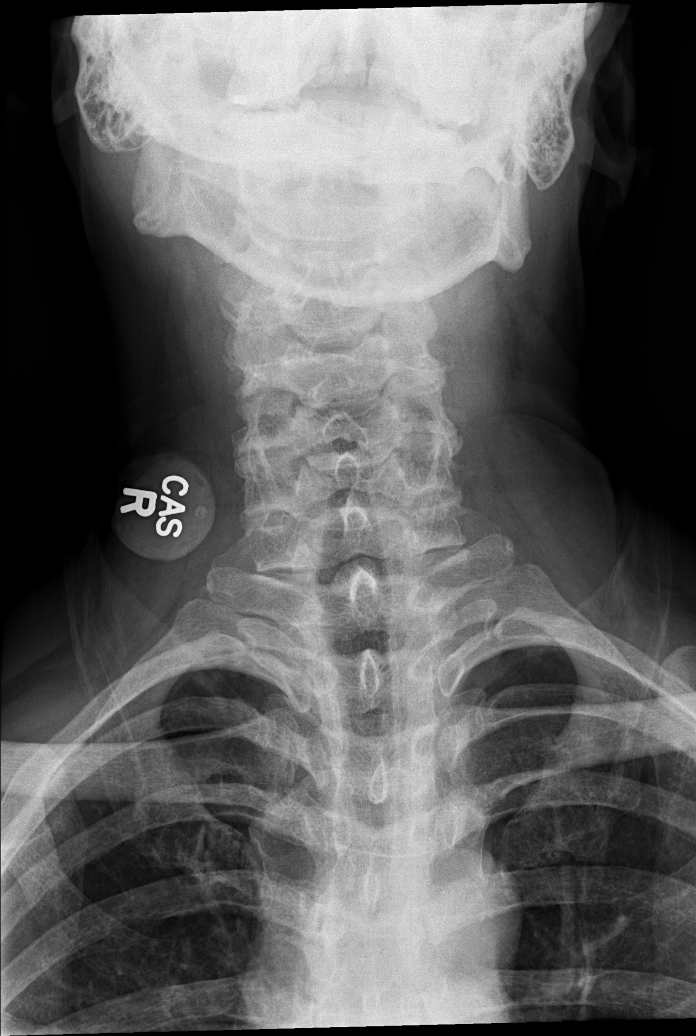

[w cervical spine odontoid (1 of 2)]
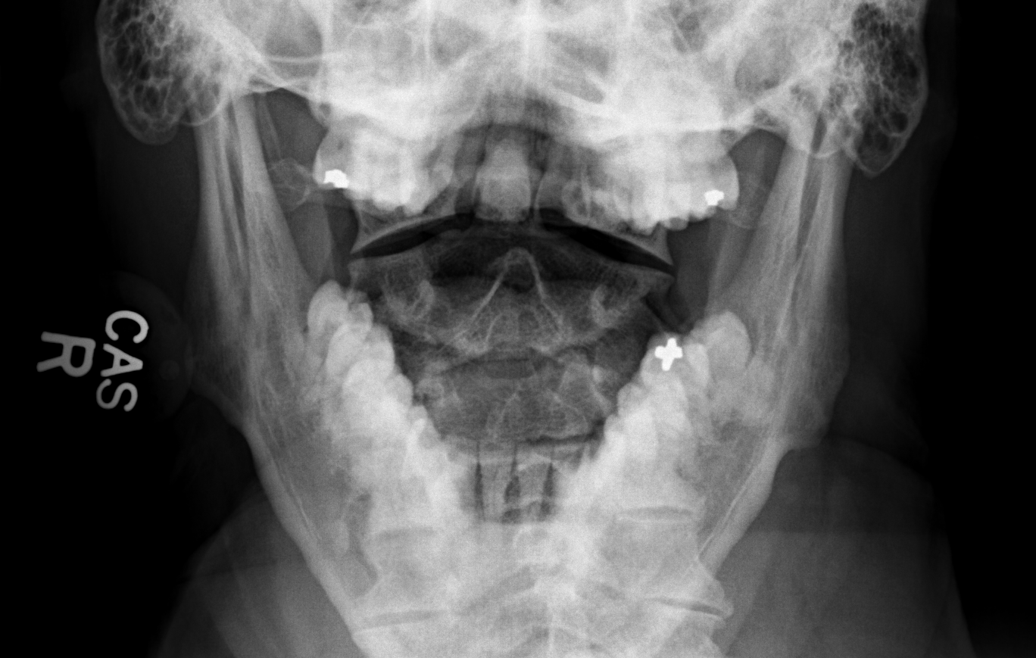

[w cervical spine odontoid (2 of 2)]
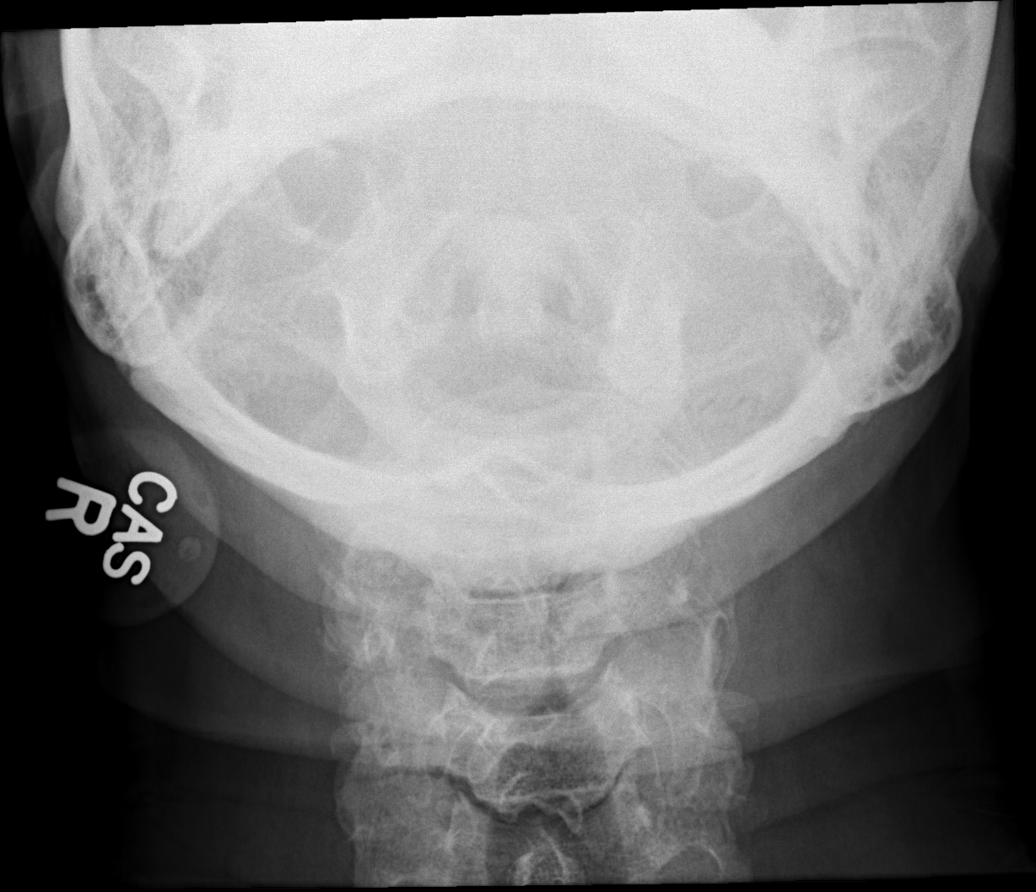

[6 of 6 positions shown; findings below may reference images not displayed]

FINDINGS: Normal alignment of the cervical vertebrae and facet
joints.  Lateral masses of C1 appear symmetrical.  The odontoid
process appears intact.  Degenerative changes in the cervical facet
joints.  Endplate changes superiorly and C4 and inferiorly to C5
likely represent limbus vertebrae.  Intervertebral disc space
heights are preserved.  No vertebral compression deformities.  No
prevertebral soft tissue swelling.  No focal bone lesion or bone
destruction.  Bone cortex and trabecular architecture appear
intact.
IMPRESSION: Degenerative changes in the cervical spine.  No displaced fractures
identified.

## 2014-11-18 ENCOUNTER — Encounter (HOSPITAL_COMMUNITY): Payer: Self-pay | Admitting: Emergency Medicine

## 2014-11-18 ENCOUNTER — Emergency Department (HOSPITAL_COMMUNITY): Payer: Self-pay

## 2014-11-18 ENCOUNTER — Emergency Department (HOSPITAL_COMMUNITY)
Admission: EM | Admit: 2014-11-18 | Discharge: 2014-11-18 | Disposition: A | Discharge status: Home / Self Care | Payer: Self-pay | Attending: Emergency Medicine | Admitting: Emergency Medicine

## 2014-11-18 DIAGNOSIS — Y92002 Bathroom of unspecified non-institutional (private) residence single-family (private) house as the place of occurrence of the external cause: Secondary | ICD-10-CM | POA: Insufficient documentation

## 2014-11-18 DIAGNOSIS — Y9389 Activity, other specified: Secondary | ICD-10-CM | POA: Insufficient documentation

## 2014-11-18 DIAGNOSIS — Z720 Tobacco use: Secondary | ICD-10-CM | POA: Insufficient documentation

## 2014-11-18 DIAGNOSIS — S8992XA Unspecified injury of left lower leg, initial encounter: Secondary | ICD-10-CM | POA: Insufficient documentation

## 2014-11-18 DIAGNOSIS — W01198A Fall on same level from slipping, tripping and stumbling with subsequent striking against other object, initial encounter: Secondary | ICD-10-CM | POA: Insufficient documentation

## 2014-11-18 DIAGNOSIS — Z79899 Other long term (current) drug therapy: Secondary | ICD-10-CM | POA: Insufficient documentation

## 2014-11-18 DIAGNOSIS — K219 Gastro-esophageal reflux disease without esophagitis: Secondary | ICD-10-CM | POA: Insufficient documentation

## 2014-11-18 DIAGNOSIS — S299XXA Unspecified injury of thorax, initial encounter: Secondary | ICD-10-CM | POA: Insufficient documentation

## 2014-11-18 DIAGNOSIS — M25562 Pain in left knee: Secondary | ICD-10-CM

## 2014-11-18 DIAGNOSIS — S3991XA Unspecified injury of abdomen, initial encounter: Secondary | ICD-10-CM | POA: Insufficient documentation

## 2014-11-18 DIAGNOSIS — Z85820 Personal history of malignant melanoma of skin: Secondary | ICD-10-CM | POA: Insufficient documentation

## 2014-11-18 DIAGNOSIS — Y998 Other external cause status: Secondary | ICD-10-CM | POA: Insufficient documentation

## 2014-11-18 MED ORDER — HYDROCODONE-ACETAMINOPHEN 5-325 MG PO TABS: 1.0000 | ORAL_TABLET | ORAL | Status: DC | PRN

## 2014-11-18 MED ORDER — HYDROCODONE-ACETAMINOPHEN 5-325 MG PO TABS
1.0000 | ORAL_TABLET | Freq: Once | ORAL | Status: AC
  Administered 2014-11-18: 1 via ORAL
  Filled 2014-11-18: qty 1

## 2014-11-18 NOTE — ED Notes (Signed)
Patient transported to X-ray 

## 2014-11-18 NOTE — ED Notes (Signed)
Bed: [facility] Expected date:  Expected time:  Means of arrival:  Comments: 

## 2014-11-18 NOTE — ED Notes (Addendum)
Pt c/o left knee edema with pain radiating into upper leg and foot. Edema, redness, numbness present on left foot, redness and edema to right interior ankle, joints of right great toe along with difficulty ambulating. Pt also states he fell today, hit left lower rib cage, which is now swollen and painful. Pedal pulses 2+ bilateral, motor function intact, sensation intact but reduced to left toes.

## 2014-11-18 NOTE — Progress Notes (Signed)
54 yr old uninsured male c/o c/o left knee edema with pain radiating into upper leg and foot with [employer] jail escort  CM spoke with pt who confirms uninsured [employer] resident with no pcp.  CM discussed and provided written information for uninsured accepting pcps, discussed the importance of pcp vs EDP services for f/u care, www.needymeds.org, www.goodrx.com, discounted pharmacies and other [employer] such as [employer], affordable care act, financial assistance, uninsured dental services, [place] med assist, DSS and  health department  Reviewed resources for [employer] uninsured accepting pcps like Jinny Blossom, family medicine at [employer], [facility], [facility], local urgent care centers, [facility], [facility] family practice, general medical clinics, family services of the [place], [facility] urgent care plus others, medication resources, [facility] out patient pharmacies and housing Pt voiced understanding and appreciation of resources provided   Provided [facility] contact information Pt did not agreed to a referral at this time Incarcerated Pt with reading glasses he put on but informed CM he may not be able to read materials offered

## 2014-11-18 NOTE — Discharge Instructions (Signed)

## 2014-11-18 NOTE — ED Notes (Signed)
Pt reports hearing a "pop" from his L knee when he stood up from a sitting position Sunday while watching the football game.  Swelling noted.  Unable to bear weight on that leg.

## 2014-11-18 NOTE — ED Provider Notes (Signed)
CSN: [id number]     Arrival date & time 11/18/14  1336 History   First MD Initiated Contact with Patient 11/18/14 1501     Chief Complaint  Patient presents with  . Joint Swelling     (Consider location/radiation/quality/duration/timing/severity/associated sxs/prior Treatment) HPI   James [name] is a 54 y.o. male with PMH significant for GERD who presents with worsening sharp, aching, constant left knee pain after standing from a seated position 5 days ago. Patient states that he is now unable to ambulate due to pain. He reports no twisting movement or trauma to the left knee. No previous injuries. Associated sxs include numbness and tingling and decreased appetite due to pain.  Patient also reports he fell while using the bathroom today, hitting his chest on the toilet.  He states he did not hit his head or lose consciousness.  Associated symptoms include SOB.  Denies fevers, chills, abdominal pain, HA, or LOC.  Past Medical History  Diagnosis Date  . GERD (gastroesophageal reflux disease)   . Melanoma    Past Surgical History  Procedure Laterality Date  . Right armpit surgery      for melanoma   Family History  Problem Relation Age of Onset  . Lung cancer Mother    Social History  Substance Use Topics  . Smoking status: Current Every Day Smoker -- 0.25 packs/day  . Smokeless tobacco: Never Used  . Alcohol Use: Yes     Comment: social    Review of Systems  All other systems negative unless otherwise stated in HPI   Allergies  Sulfa antibiotics  Home Medications   Prior to Admission medications   Medication Sig Start Date End Date Taking? Authorizing Provider  Aspirin-Acetaminophen-Caffeine (GOODY HEADACHE PO) Take 1 packet by mouth 3 (three) times daily as needed (arthritis pain).    Historical Provider, MD  ibuprofen (ADVIL,MOTRIN) 200 MG tablet Take 800 mg by mouth daily. For rheumatoid arthritis    Historical Provider, MD  omeprazole (PRILOSEC OTC) 20 MG  tablet Take 20 mg by mouth daily.    Historical Provider, MD  oxyCODONE (ROXICODONE) 5 MG immediate release tablet Take 1 tablet (5 mg total) by mouth every 6 (six) hours as needed for severe pain. 08/13/13   Corene Cornea Mesner, MD   BP 149/101 mmHg  Pulse 111  Temp(Src) 97.8 F (36.6 C) (Oral)  Resp 16  SpO2 98% Physical Exam  Constitutional: He is oriented to person, place, and time. He appears well-developed and well-nourished.  HENT:  Head: Normocephalic and atraumatic.  Mouth/Throat: Oropharynx is clear and moist.  Eyes: Conjunctivae are normal.  Neck: Normal range of motion. Neck supple.  Cardiovascular: Normal rate, regular rhythm and intact distal pulses.   No murmur heard. Pulmonary/Chest: Effort normal and breath sounds normal. No respiratory distress. He has no wheezes. He has no rales.  Abdominal: Soft. Bowel sounds are normal. He exhibits no distension. There is tenderness. There is guarding. There is no rigidity and no rebound.  Musculoskeletal:       Right knee: Normal.       Left knee: He exhibits decreased range of motion, swelling and effusion. He exhibits no ecchymosis, no deformity, no laceration, no erythema, normal alignment, no LCL laxity, normal patellar mobility and no MCL laxity. Tenderness found. Medial joint line, lateral joint line, MCL, LCL and patellar tendon tenderness noted.       Right ankle: Normal.       Left ankle: Normal.  LLE compartment is  soft and compressible.  Neurological: He is alert and oriented to person, place, and time. He has normal strength. No sensory deficit.  Skin: Skin is warm and dry.  Psychiatric: He has a normal mood and affect. His behavior is normal.    ED Course  Procedures (including critical care time) Labs Review Labs Reviewed - No data to display  Imaging Review Dg Chest 2 View  11/18/2014   CLINICAL DATA:  Fall, hit left lower rib cage.  Pain.  EXAM: CHEST  2 VIEW  COMPARISON:  05/24/2012  FINDINGS: Left basilar  atelectasis. Heart is normal size. Right lung is clear. No visible rib fracture. No pneumothorax.  IMPRESSION: Left base atelectasis.   Electronically Signed   By: Rolm Baptise M.D.   On: 11/18/2014 15:59   Dg Ankle Complete Left  11/18/2014   CLINICAL DATA:  Acute left ankle pain and swelling after fall today.  EXAM: LEFT ANKLE COMPLETE - 3+ VIEW  COMPARISON:  None.  FINDINGS: There is no evidence of fracture, dislocation, or joint effusion. There is no evidence of arthropathy or other focal bone abnormality. Swelling over the lateral malleolus is noted.  IMPRESSION: No fracture or dislocation is noted. Soft tissue swelling over lateral malleolus is noted suggesting ligamentous injury.   Electronically Signed   By: Marijo Conception, M.D.   On: 11/18/2014 15:59   Dg Knee Complete 4 Views Left  11/18/2014   CLINICAL DATA:  Acute left knee pain after staining from seated position several days ago. Initial encounter.  EXAM: LEFT KNEE - COMPLETE 4+ VIEW  COMPARISON:  None.  FINDINGS: There is no evidence of fracture or dislocation. Moderate suprapatellar joint effusion is noted. There is no evidence of arthropathy or other focal bone abnormality. Soft tissues are unremarkable.  IMPRESSION: Moderate suprapatellar joint effusion. No fracture or dislocation is noted.   Electronically Signed   By: Marijo Conception, M.D.   On: 11/18/2014 15:57   Dg Foot Complete Left  11/18/2014   CLINICAL DATA:  Left leg and foot pain with redness, swelling and numbness about the left foot and ankle. Status post fall today. Initial encounter.  EXAM: LEFT FOOT - COMPLETE 3+ VIEW  COMPARISON:  None.  FINDINGS: No acute bony or joint abnormality is identified. Calcaneal spurring is noted. Or soft tissues are unremarkable.  IMPRESSION: No acute abnormality.   Electronically Signed   By: Inge Rise M.D.   On: 11/18/2014 15:59   I have personally reviewed and evaluated these images and lab results as part of my medical  decision-making.   EKG Interpretation None      MDM   Final diagnoses:  None   Patient presents with left knee pain after hearing a pop from seated to standing position 5 days ago and suffering a fall earlier today while using the restroom.  Tachycardia present.  NAD, pt appears non-toxic.  On exam, decreased ROM of the left knee.  Effusion noted.  Lower extremity compartment is soft and compressible.  Sensation and strength intact. Concern for fracture vs contusion.  Plain films show moderate suprapatellar joint effusion, no fracture or dislocation of the left knee. Plain films of left foot and ankle show no acute fracture or dislocation.  CXR shows left base atelectasis.  Will provide pain control and apply knee sleeve.  Pt stable for discharge.  Follow up PCP if pain persists.  Patient agrees and acknowledges the above plan..    Case has been discussed with  Dr. Alvino Chapel who agrees with the above plan for discharge.     Gloriann Loan, PA-C 11/18/14 [place], MD 11/19/14 [phone]

## 2015-02-02 ENCOUNTER — Other Ambulatory Visit: Payer: Self-pay | Admitting: Urology

## 2015-02-03 ENCOUNTER — Other Ambulatory Visit: Payer: Self-pay | Admitting: Urology

## 2015-02-07 ENCOUNTER — Ambulatory Visit (INDEPENDENT_AMBULATORY_CARE_PROVIDER_SITE_OTHER): Payer: Self-pay | Admitting: Emergency Medicine

## 2015-02-07 VITALS — BP 142/98 | HR 99 | Temp 97.6°F | Resp 16 | Ht 74.0 in | Wt 209.8 lb

## 2015-02-07 DIAGNOSIS — N281 Cyst of kidney, acquired: Secondary | ICD-10-CM

## 2015-02-07 DIAGNOSIS — Z01818 Encounter for other preprocedural examination: Secondary | ICD-10-CM

## 2015-02-07 DIAGNOSIS — Q6100 Congenital renal cyst, unspecified: Secondary | ICD-10-CM

## 2015-02-07 DIAGNOSIS — K219 Gastro-esophageal reflux disease without esophagitis: Secondary | ICD-10-CM

## 2015-02-07 NOTE — Progress Notes (Signed)
Subjective:  Patient ID: James [name], male    DOB: 1960/04/14  Age: 54 y.o. MRN: [id number]  CC: Other   HPI CHAD HOERIG presents  for surgical clearance. He takes no medication and has a history of malignant melanoma which was treated by excision. Has gastroesophageal reflux disorder looking 3 years ago. His record shows that he has an alcohol abuse problem but he says he only drinks on weekends not to excess. He says he has not been intoxicated about 9 months  History Kairyn has a past medical history of GERD (gastroesophageal reflux disease) and Melanoma ([place]).   He has past surgical history that includes right armpit surgery.   His  family history includes Lung cancer in his mother.  He   reports that he quit smoking about 3 years ago. He has never used smokeless tobacco. He reports that he drinks alcohol. He reports that he does not use illicit drugs.  Outpatient Prescriptions Prior to Visit  Medication Sig Dispense Refill  . HYDROcodone-acetaminophen (NORCO/VICODIN) 5-325 MG per tablet Take 1 tablet by mouth every 4 (four) hours as needed. (Patient taking differently: Take 1 tablet by mouth every 4 (four) hours as needed (pain). ) 9 tablet 0  . ibuprofen (ADVIL,MOTRIN) 200 MG tablet Take 800 mg by mouth 2 (two) times daily.    [name] omeprazole (PRILOSEC OTC) 20 MG tablet Take 10 mg by mouth every other day.    . traMADol (ULTRAM) 50 MG tablet Take 100 mg by mouth at bedtime.     No facility-administered medications prior to visit.    Social History   Social History  . Marital Status: Married    Spouse Name: N/A  . Number of Children: N/A  . Years of Education: N/A   Social History Main Topics  . Smoking status: Former Smoker -- 0.25 packs/day    Quit date: 11/08/2011  . Smokeless tobacco: Never Used  . Alcohol Use: Yes     Comment: social  . Drug Use: No  . Sexual Activity: No   Other Topics Concern  . None   Social History Narrative     Review  of Systems  Constitutional: Negative for fever, chills and appetite change.  HENT: Negative for congestion, ear pain, postnasal drip, sinus pressure and sore throat.   Eyes: Negative for pain and redness.  Respiratory: Negative for cough, shortness of breath and wheezing.   Cardiovascular: Negative for leg swelling.  Gastrointestinal: Negative for nausea, vomiting, abdominal pain, diarrhea, constipation and blood in stool.  Endocrine: Negative for polyuria.  Genitourinary: Negative for dysuria, urgency, frequency and flank pain.  Musculoskeletal: Negative for gait problem.  Skin: Negative for rash.  Neurological: Negative for weakness and headaches.  Psychiatric/Behavioral: Negative for confusion and decreased concentration. The patient is not nervous/anxious.     Objective:  BP 142/98 mmHg  Pulse 99  Temp(Src) 97.6 F (36.4 C) (Oral)  Resp 16  Ht 6\' 2"  (1.88 m)  Wt 209 lb 12.8 oz (95.165 kg)  BMI 26.93 kg/m2  SpO2 99%  Physical Exam  Constitutional: He is oriented to person, place, and time. He appears well-developed and well-nourished. No distress.  HENT:  Head: Normocephalic and atraumatic.  Right Ear: External ear normal.  Left Ear: External ear normal.  Nose: Nose normal.  Eyes: Conjunctivae and EOM are normal. Pupils are equal, round, and reactive to light. No scleral icterus.  Neck: Normal range of motion. Neck supple. No tracheal deviation present.  Cardiovascular: Normal  rate, regular rhythm and normal heart sounds.   Pulmonary/Chest: Effort normal. No respiratory distress. He has no wheezes. He has no rales.  Abdominal: He exhibits no mass. There is no tenderness. There is no rebound and no guarding.  Musculoskeletal: He exhibits no edema.  Lymphadenopathy:    He has no cervical adenopathy.  Neurological: He is alert and oriented to person, place, and time. Coordination normal.  Skin: Skin is warm and dry. No rash noted.  Psychiatric: He has a normal mood and  affect. His behavior is normal.      Assessment & Plan:   Adrienne was seen today for other.  Diagnoses and all orders for this visit:  Renal cyst  Gastroesophageal reflux disease, esophagitis presence not specified  Preop examination -     EKG 12-Lead   I am having Mr. Rajaram maintain his HYDROcodone-acetaminophen, omeprazole, ibuprofen, and traMADol.  No orders of the defined types were placed in this encounter.    Appropriate red flag conditions were discussed with the patient as well as actions that should be taken.  Patient expressed his understanding.  Follow-up: Return if symptoms worsen or fail to improve.  Roselee Culver, MD

## 2015-02-10 ENCOUNTER — Ambulatory Visit (HOSPITAL_COMMUNITY)
Admission: RE | Admit: 2015-02-10 | Discharge: 2015-02-10 | Disposition: A | Discharge status: Home / Self Care | Payer: Self-pay | Source: Ambulatory Visit | Attending: Anesthesiology | Admitting: Anesthesiology

## 2015-02-10 ENCOUNTER — Encounter (HOSPITAL_COMMUNITY): Payer: Self-pay

## 2015-02-10 ENCOUNTER — Encounter (HOSPITAL_COMMUNITY)
Admission: RE | Admit: 2015-02-10 | Discharge: 2015-02-10 | Disposition: A | Discharge status: Home / Self Care | Payer: Self-pay | Source: Ambulatory Visit | Attending: Urology | Admitting: Urology

## 2015-02-10 DIAGNOSIS — Z87891 Personal history of nicotine dependence: Secondary | ICD-10-CM | POA: Insufficient documentation

## 2015-02-10 DIAGNOSIS — R9389 Abnormal findings on diagnostic imaging of other specified body structures: Secondary | ICD-10-CM

## 2015-02-10 DIAGNOSIS — I10 Essential (primary) hypertension: Secondary | ICD-10-CM | POA: Insufficient documentation

## 2015-02-10 DIAGNOSIS — R938 Abnormal findings on diagnostic imaging of other specified body structures: Secondary | ICD-10-CM | POA: Insufficient documentation

## 2015-02-10 DIAGNOSIS — Z01818 Encounter for other preprocedural examination: Secondary | ICD-10-CM | POA: Insufficient documentation

## 2015-02-10 HISTORY — DX: Unspecified osteoarthritis, unspecified site: M19.90

## 2015-02-10 HISTORY — DX: Other specified disorders of kidney and ureter: N28.89

## 2015-02-10 HISTORY — DX: Presence of spectacles and contact lenses: Z97.3

## 2015-02-10 HISTORY — DX: Essential (primary) hypertension: I10

## 2015-02-10 LAB — COMPREHENSIVE METABOLIC PANEL
ALT: 69 U/L — ABNORMAL HIGH (ref 17–63)
AST: 130 U/L — ABNORMAL HIGH (ref 15–41)
Albumin: 4.6 g/dL (ref 3.5–5.0)
Alkaline Phosphatase: 64 U/L (ref 38–126)
Anion gap: 13 (ref 5–15)
BUN: 8 mg/dL (ref 6–20)
CO2: 23 mmol/L (ref 22–32)
Calcium: 9.6 mg/dL (ref 8.9–10.3)
Chloride: 105 mmol/L (ref 101–111)
Creatinine, Ser: 0.77 mg/dL (ref 0.61–1.24)
GFR calc Af Amer: >60 mL/min (ref >60)
GFR calc non Af Amer: >60 mL/min (ref >60)
Glucose, Bld: 126 mg/dL — ABNORMAL HIGH (ref 65–99)
Potassium: 4.3 mmol/L (ref 3.5–5.1)
Sodium: 141 mmol/L (ref 135–145)
Total Bilirubin: 0.9 mg/dL (ref 0.3–1.2)
Total Protein: 8.8 g/dL — ABNORMAL HIGH (ref 6.5–8.1)

## 2015-02-10 LAB — CBC
HCT: 43.3 % (ref 39.0–52.0)
Hemoglobin: 14.6 g/dL (ref 13.0–17.0)
MCH: 30.8 pg (ref 26.0–34.0)
MCHC: 33.7 g/dL (ref 30.0–36.0)
MCV: 91.4 fL (ref 78.0–100.0)
Platelets: 195 10*3/uL (ref 150–400)
RBC: 4.74 MIL/uL (ref 4.22–5.81)
RDW: 15 % (ref 11.5–15.5)
WBC: 5 10*3/uL (ref 4.0–10.5)

## 2015-02-10 LAB — ABO/RH: ABO/RH(D): A POS

## 2015-02-10 NOTE — Progress Notes (Signed)
EKG / epic 02/07/2015  Clearance note per chart per Dr Jacqulynn Cadet 02/07/2015

## 2015-02-10 NOTE — Progress Notes (Signed)
Discussed importance of no alcohol consumption prior to arrival for surgery. Pt verbalized understanding.

## 2015-02-10 NOTE — Patient Instructions (Signed)
James [name]  02/10/2015   Your procedure is scheduled on:  Wednesday February 15, 2015   Report to [facility]  Entrance take [place]  elevators to 3rd floor to  [place] at 8:30 AM.  Call this number if you have problems the morning of surgery [phone]   Remember: ONLY 1 PERSON MAY GO WITH YOU TO SHORT STAY TO GET  READY MORNING OF [place].  Do not eat food or drink liquids :After Midnight.     Take these medicines the morning of surgery with A SIP OF WATER: Omeprazole (Prilosec) if day to take; Hydrocodone-Acetaminophen if needed (if able to obtain new prescription) pt states currently out of prescription                                 You may not have any metal on your body including hair pins and              piercings  Do not wear jewelry, lotions, powders or colognes, deodorant                          Men can shave day of surgery.  Do not bring valuables to the hospital. [place].  Contacts, dentures or bridgework may not be worn into surgery.  Leave suitcase in the car. After surgery it may be brought to your room.   _____________________________________________________________________             [facility] - Preparing for Surgery Before surgery, you can play an important role.  Because skin is not sterile, your skin needs to be as free of germs as possible.  You can reduce the number of germs on your skin by washing with CHG (chlorahexidine gluconate) soap before surgery.  CHG is an antiseptic cleaner which kills germs and bonds with the skin to continue killing germs even after washing. Please DO NOT use if you have an allergy to CHG or antibacterial soaps.  If your skin becomes reddened/irritated stop using the CHG and inform your nurse when you arrive at Short Stay. Do not shave (including legs and underarms) for at least 48 hours prior to the first CHG shower.  You may  shave your face/neck. Please follow these instructions carefully:  1.  Shower with CHG Soap the night before surgery and the  morning of Surgery.  2.  If you choose to wash your hair, wash your hair first as usual with your  normal  shampoo.  3.  After you shampoo, rinse your hair and body thoroughly to remove the  shampoo.                           4.  Use CHG as you would any other liquid soap.  You can apply chg directly  to the skin and wash                       Gently with a scrungie or clean washcloth.  5.  Apply the CHG Soap to your body ONLY FROM THE NECK DOWN.   Do not use on face/ open  Wound or open sores. Avoid contact with eyes, ears mouth and genitals (private parts).                       Wash face,  Genitals (private parts) with your normal soap.             6.  Wash thoroughly, paying special attention to the area where your surgery  will be performed.  7.  Thoroughly rinse your body with warm water from the neck down.  8.  DO NOT shower/wash with your normal soap after using and rinsing off  the CHG Soap.                9.  Pat yourself dry with a clean towel.            10.  Wear clean pajamas.            11.  Place clean sheets on your bed the night of your first shower and do not  sleep with pets. Day of Surgery : Do not apply any lotions/deodorants the morning of surgery.  Please wear clean clothes to the hospital/surgery center.  FAILURE TO FOLLOW THESE INSTRUCTIONS MAY RESULT IN THE CANCELLATION OF YOUR SURGERY PATIENT SIGNATURE_________________________________  NURSE SIGNATURE__________________________________  ________________________________________________________________________

## 2015-02-15 ENCOUNTER — Encounter (HOSPITAL_COMMUNITY): Payer: Self-pay | Admitting: *Deleted

## 2015-02-15 ENCOUNTER — Inpatient Hospital Stay (HOSPITAL_COMMUNITY): Payer: Self-pay | Admitting: Registered Nurse

## 2015-02-15 ENCOUNTER — Encounter (HOSPITAL_COMMUNITY)
Admission: RE | Disposition: A | Discharge status: Home / Self Care | Payer: Self-pay | Source: Ambulatory Visit | Attending: Urology

## 2015-02-15 ENCOUNTER — Inpatient Hospital Stay (HOSPITAL_COMMUNITY)
Admission: RE | Admit: 2015-02-15 | Discharge: 2015-02-19 | DRG: 657 | Disposition: A | Discharge status: Home / Self Care | Payer: Self-pay | Source: Ambulatory Visit | Attending: Urology | Admitting: Urology

## 2015-02-15 DIAGNOSIS — D62 Acute posthemorrhagic anemia: Secondary | ICD-10-CM | POA: Diagnosis not present

## 2015-02-15 DIAGNOSIS — Z85820 Personal history of malignant melanoma of skin: Secondary | ICD-10-CM

## 2015-02-15 DIAGNOSIS — C649 Malignant neoplasm of unspecified kidney, except renal pelvis: Secondary | ICD-10-CM | POA: Insufficient documentation

## 2015-02-15 DIAGNOSIS — C642 Malignant neoplasm of left kidney, except renal pelvis: Principal | ICD-10-CM | POA: Diagnosis present

## 2015-02-15 DIAGNOSIS — F17200 Nicotine dependence, unspecified, uncomplicated: Secondary | ICD-10-CM | POA: Diagnosis present

## 2015-02-15 DIAGNOSIS — K219 Gastro-esophageal reflux disease without esophagitis: Secondary | ICD-10-CM | POA: Diagnosis present

## 2015-02-15 DIAGNOSIS — Z801 Family history of malignant neoplasm of trachea, bronchus and lung: Secondary | ICD-10-CM

## 2015-02-15 DIAGNOSIS — Z8249 Family history of ischemic heart disease and other diseases of the circulatory system: Secondary | ICD-10-CM

## 2015-02-15 DIAGNOSIS — R000 Tachycardia, unspecified: Secondary | ICD-10-CM | POA: Diagnosis present

## 2015-02-15 DIAGNOSIS — Z79899 Other long term (current) drug therapy: Secondary | ICD-10-CM

## 2015-02-15 DIAGNOSIS — N2889 Other specified disorders of kidney and ureter: Secondary | ICD-10-CM | POA: Diagnosis present

## 2015-02-15 HISTORY — PX: NEPHRECTOMY: SHX65

## 2015-02-15 LAB — BASIC METABOLIC PANEL
Anion gap: 8 (ref 5–15)
BUN: 10 mg/dL (ref 6–20)
CO2: 23 mmol/L (ref 22–32)
Calcium: 7.8 mg/dL — ABNORMAL LOW (ref 8.9–10.3)
Chloride: 110 mmol/L (ref 101–111)
Creatinine, Ser: 1.01 mg/dL (ref 0.61–1.24)
GFR calc Af Amer: >60 mL/min (ref >60)
GFR calc non Af Amer: >60 mL/min (ref >60)
Glucose, Bld: 167 mg/dL — ABNORMAL HIGH (ref 65–99)
Potassium: 4.1 mmol/L (ref 3.5–5.1)
Sodium: 141 mmol/L (ref 135–145)

## 2015-02-15 LAB — POCT I-STAT 4, (NA,K, GLUC, HGB,HCT)
Glucose, Bld: 139 mg/dL — ABNORMAL HIGH (ref 65–99)
HCT: 26 % — ABNORMAL LOW (ref 39.0–52.0)
Hemoglobin: 8.8 g/dL — ABNORMAL LOW (ref 13.0–17.0)
Potassium: 4.4 mmol/L (ref 3.5–5.1)
Sodium: 139 mmol/L (ref 135–145)

## 2015-02-15 LAB — HEMOGLOBIN AND HEMATOCRIT, BLOOD
HCT: 32.6 % — ABNORMAL LOW (ref 39.0–52.0)
Hemoglobin: 11.2 g/dL — ABNORMAL LOW (ref 13.0–17.0)

## 2015-02-15 SURGERY — NEPHRECTOMY
Anesthesia: General | Laterality: Left

## 2015-02-15 MED ORDER — HYDROMORPHONE HCL 1 MG/ML IJ SOLN
INTRAMUSCULAR | Status: DC
Start: 2015-02-15 — End: 2015-02-15
  Filled 2015-02-15: qty 1

## 2015-02-15 MED ORDER — OXYCODONE HCL 5 MG/5ML PO SOLN
5.0000 mg | Freq: Once | ORAL | Status: DC | PRN
  Filled 2015-02-15: qty 5

## 2015-02-15 MED ORDER — BACITRACIN-NEOMYCIN-POLYMYXIN 400-5-5000 EX OINT
1.0000 "application " | TOPICAL_OINTMENT | Freq: Three times a day (TID) | CUTANEOUS | Status: DC | PRN
  Filled 2015-02-15: qty 1

## 2015-02-15 MED ORDER — ACETAMINOPHEN 10 MG/ML IV SOLN
INTRAVENOUS | Status: AC
  Filled 2015-02-15: qty 100

## 2015-02-15 MED ORDER — MANNITOL 25 % IV SOLN
INTRAVENOUS | Status: AC
  Filled 2015-02-15: qty 50

## 2015-02-15 MED ORDER — DIPHENHYDRAMINE HCL 50 MG/ML IJ SOLN: 12.5000 mg | Freq: Four times a day (QID) | INTRAMUSCULAR | Status: DC | PRN

## 2015-02-15 MED ORDER — SODIUM CHLORIDE 0.9 % IV SOLN: 250.0000 mg | INTRAVENOUS | Status: DC | PRN

## 2015-02-15 MED ORDER — LACTATED RINGERS IV SOLN
INTRAVENOUS | Status: DC
  Administered 2015-02-15 (×5): via INTRAVENOUS
  Administered 2015-02-15: 1000 mL via INTRAVENOUS

## 2015-02-15 MED ORDER — POLYETHYLENE GLYCOL 3350 17 G PO PACK
17.0000 g | PACK | Freq: Every day | ORAL | Status: DC | PRN
  Filled 2015-02-15: qty 1

## 2015-02-15 MED ORDER — DEXAMETHASONE SODIUM PHOSPHATE 10 MG/ML IJ SOLN
INTRAMUSCULAR | Status: DC | PRN
  Administered 2015-02-15: 10 mg via INTRAVENOUS

## 2015-02-15 MED ORDER — PROPOFOL 10 MG/ML IV BOLUS
INTRAVENOUS | Status: AC
  Filled 2015-02-15: qty 20

## 2015-02-15 MED ORDER — SUFENTANIL CITRATE 50 MCG/ML IV SOLN
INTRAVENOUS | Status: AC
  Filled 2015-02-15: qty 1

## 2015-02-15 MED ORDER — PROPOFOL 10 MG/ML IV BOLUS
INTRAVENOUS | Status: DC | PRN
  Administered 2015-02-15: 270 mg via INTRAVENOUS

## 2015-02-15 MED ORDER — LACTATED RINGERS IV SOLN
INTRAVENOUS | Status: DC | PRN
  Administered 2015-02-15 (×3): via INTRAVENOUS

## 2015-02-15 MED ORDER — NALOXONE HCL 0.4 MG/ML IJ SOLN: 0.4000 mg | INTRAMUSCULAR | Status: DC | PRN

## 2015-02-15 MED ORDER — OMEPRAZOLE MAGNESIUM 20 MG PO TBEC: 20.0000 mg | DELAYED_RELEASE_TABLET | Freq: Every day | ORAL | Status: DC

## 2015-02-15 MED ORDER — KETAMINE HCL 10 MG/ML IJ SOLN
INTRAMUSCULAR | Status: AC
  Filled 2015-02-15: qty 1

## 2015-02-15 MED ORDER — LIDOCAINE HCL (CARDIAC) 20 MG/ML IV SOLN
INTRAVENOUS | Status: AC
  Filled 2015-02-15: qty 5

## 2015-02-15 MED ORDER — DIPHENHYDRAMINE HCL 12.5 MG/5ML PO ELIX
12.5000 mg | ORAL_SOLUTION | Freq: Four times a day (QID) | ORAL | Status: DC | PRN
  Filled 2015-02-15: qty 5

## 2015-02-15 MED ORDER — CIPROFLOXACIN IN D5W 400 MG/200ML IV SOLN
INTRAVENOUS | Status: AC
  Filled 2015-02-15: qty 200

## 2015-02-15 MED ORDER — PHENYLEPHRINE HCL 10 MG/ML IJ SOLN
INTRAMUSCULAR | Status: AC
  Filled 2015-02-15: qty 1

## 2015-02-15 MED ORDER — GLYCOPYRROLATE 0.2 MG/ML IJ SOLN
INTRAMUSCULAR | Status: DC | PRN
  Administered 2015-02-15: 0.6 mg via INTRAVENOUS

## 2015-02-15 MED ORDER — CEFAZOLIN SODIUM-DEXTROSE 2-3 GM-% IV SOLR
INTRAVENOUS | Status: AC
  Filled 2015-02-15: qty 50

## 2015-02-15 MED ORDER — ONDANSETRON HCL 4 MG/2ML IJ SOLN
INTRAMUSCULAR | Status: DC | PRN
Start: 2015-02-15 — End: 2015-02-15
  Administered 2015-02-15: 4 mg via INTRAVENOUS

## 2015-02-15 MED ORDER — SODIUM CHLORIDE 0.9 % IJ SOLN
INTRAMUSCULAR | Status: AC
  Filled 2015-02-15: qty 10

## 2015-02-15 MED ORDER — OXYCODONE HCL 5 MG PO TABS: 5.0000 mg | ORAL_TABLET | Freq: Once | ORAL | Status: DC | PRN

## 2015-02-15 MED ORDER — HYDROMORPHONE HCL 1 MG/ML IJ SOLN
0.2500 mg | INTRAMUSCULAR | Status: DC | PRN
  Administered 2015-02-15 (×2): 0.5 mg via INTRAVENOUS

## 2015-02-15 MED ORDER — MIDAZOLAM HCL 2 MG/2ML IJ SOLN
INTRAMUSCULAR | Status: AC
  Filled 2015-02-15: qty 2

## 2015-02-15 MED ORDER — ONDANSETRON HCL 4 MG/2ML IJ SOLN: 4.0000 mg | Freq: Four times a day (QID) | INTRAMUSCULAR | Status: DC | PRN

## 2015-02-15 MED ORDER — ROCURONIUM BROMIDE 100 MG/10ML IV SOLN
INTRAVENOUS | Status: AC
  Filled 2015-02-15: qty 1

## 2015-02-15 MED ORDER — KETAMINE HCL 10 MG/ML IJ SOLN
INTRAMUSCULAR | Status: DC | PRN
  Administered 2015-02-15: 20 mg via INTRAVENOUS
  Administered 2015-02-15: 50 mg via INTRAVENOUS
  Administered 2015-02-15: 10 mg via INTRAVENOUS

## 2015-02-15 MED ORDER — 0.9 % SODIUM CHLORIDE (POUR BTL) OPTIME
TOPICAL | Status: DC | PRN
  Administered 2015-02-15: 5000 mL

## 2015-02-15 MED ORDER — CEFAZOLIN SODIUM 1-5 GM-% IV SOLN
1.0000 g | Freq: Three times a day (TID) | INTRAVENOUS | Status: AC
  Administered 2015-02-15 – 2015-02-16 (×2): 1 g via INTRAVENOUS
  Filled 2015-02-15 (×3): qty 50

## 2015-02-15 MED ORDER — PHENYLEPHRINE HCL 10 MG/ML IJ SOLN
10.0000 mg | INTRAVENOUS | Status: DC | PRN
  Administered 2015-02-15: 30 ug/min via INTRAVENOUS

## 2015-02-15 MED ORDER — CEFAZOLIN SODIUM-DEXTROSE 2-3 GM-% IV SOLR
2.0000 g | INTRAVENOUS | Status: AC
Start: 2015-02-15 — End: 2015-02-15
  Administered 2015-02-15: 2 g via INTRAVENOUS

## 2015-02-15 MED ORDER — OXYCODONE HCL 5 MG PO TABS
10.0000 mg | ORAL_TABLET | ORAL | Status: DC | PRN
  Administered 2015-02-15: 10 mg via ORAL
  Administered 2015-02-16 (×2): 15 mg via ORAL
  Administered 2015-02-16: 10 mg via ORAL
  Administered 2015-02-16 – 2015-02-19 (×9): 15 mg via ORAL
  Filled 2015-02-15: qty 3
  Filled 2015-02-15: qty 2
  Filled 2015-02-15 (×10): qty 3
  Filled 2015-02-15: qty 2

## 2015-02-15 MED ORDER — PROMETHAZINE HCL 25 MG/ML IJ SOLN
6.2500 mg | INTRAMUSCULAR | Status: DC | PRN
  Administered 2015-02-15: 12.5 mg via INTRAVENOUS

## 2015-02-15 MED ORDER — MIDAZOLAM HCL 5 MG/5ML IJ SOLN
INTRAMUSCULAR | Status: DC | PRN
  Administered 2015-02-15: 2 mg via INTRAVENOUS
  Administered 2015-02-15 (×2): 1 mg via INTRAVENOUS
  Administered 2015-02-15: 2 mg via INTRAVENOUS

## 2015-02-15 MED ORDER — HYDROMORPHONE HCL 2 MG/ML IJ SOLN
INTRAMUSCULAR | Status: AC
  Filled 2015-02-15: qty 1

## 2015-02-15 MED ORDER — MIDAZOLAM HCL 2 MG/2ML IJ SOLN
INTRAMUSCULAR | Status: AC
Start: 2015-02-15 — End: 2015-02-15
  Filled 2015-02-15: qty 2

## 2015-02-15 MED ORDER — MORPHINE SULFATE 2 MG/ML IV SOLN
INTRAVENOUS | Status: DC
  Administered 2015-02-15: 18:00:00 via INTRAVENOUS
  Administered 2015-02-15: 16 mg via INTRAVENOUS
  Administered 2015-02-16: 8 mg via INTRAVENOUS
  Administered 2015-02-16: 18 mg via INTRAVENOUS
  Administered 2015-02-16: 9 mg via INTRAVENOUS
  Administered 2015-02-16: 2 mg via INTRAVENOUS
  Filled 2015-02-15 (×2): qty 25

## 2015-02-15 MED ORDER — NEOSTIGMINE METHYLSULFATE 10 MG/10ML IV SOLN
INTRAVENOUS | Status: DC | PRN
  Administered 2015-02-15: 4 mg via INTRAVENOUS

## 2015-02-15 MED ORDER — LIDOCAINE HCL (CARDIAC) 20 MG/ML IV SOLN
INTRAVENOUS | Status: DC | PRN
  Administered 2015-02-15: 100 mg via INTRAVENOUS

## 2015-02-15 MED ORDER — PHENYLEPHRINE HCL 10 MG/ML IJ SOLN
INTRAMUSCULAR | Status: DC | PRN
  Administered 2015-02-15: 80 ug via INTRAVENOUS

## 2015-02-15 MED ORDER — PANTOPRAZOLE SODIUM 40 MG PO TBEC
40.0000 mg | DELAYED_RELEASE_TABLET | Freq: Every day | ORAL | Status: DC
  Administered 2015-02-16: 40 mg via ORAL
  Filled 2015-02-15 (×3): qty 1

## 2015-02-15 MED ORDER — BUPIVACAINE LIPOSOME 1.3 % IJ SUSP
20.0000 mL | Freq: Once | INTRAMUSCULAR | Status: AC
  Administered 2015-02-15: 20 mL
  Filled 2015-02-15: qty 20

## 2015-02-15 MED ORDER — ACETAMINOPHEN 10 MG/ML IV SOLN
1000.0000 mg | Freq: Four times a day (QID) | INTRAVENOUS | Status: AC
  Administered 2015-02-15 – 2015-02-17 (×7): 1000 mg via INTRAVENOUS
  Filled 2015-02-15 (×11): qty 100

## 2015-02-15 MED ORDER — SODIUM CHLORIDE 0.9 % IV SOLN
250.0000 mg | INTRAVENOUS | Status: DC | PRN
  Administered 2015-02-15: 3.6 ug/kg/min via INTRAVENOUS

## 2015-02-15 MED ORDER — DEXAMETHASONE SODIUM PHOSPHATE 10 MG/ML IJ SOLN
INTRAMUSCULAR | Status: AC
  Filled 2015-02-15: qty 1

## 2015-02-15 MED ORDER — LABETALOL HCL 5 MG/ML IV SOLN
INTRAVENOUS | Status: AC
  Filled 2015-02-15: qty 4

## 2015-02-15 MED ORDER — PHENYLEPHRINE 40 MCG/ML (10ML) SYRINGE FOR IV PUSH (FOR BLOOD PRESSURE SUPPORT)
PREFILLED_SYRINGE | INTRAVENOUS | Status: AC
  Filled 2015-02-15: qty 10

## 2015-02-15 MED ORDER — SODIUM CHLORIDE 0.9 % IJ SOLN
INTRAMUSCULAR | Status: AC
  Filled 2015-02-15: qty 20

## 2015-02-15 MED ORDER — SODIUM CHLORIDE 0.9 % IV SOLN
10.0000 mL/h | Freq: Once | INTRAVENOUS | Status: AC
  Administered 2015-02-15: 14:00:00 via INTRAVENOUS

## 2015-02-15 MED ORDER — ROCURONIUM BROMIDE 100 MG/10ML IV SOLN
INTRAVENOUS | Status: DC | PRN
  Administered 2015-02-15: 20 mg via INTRAVENOUS
  Administered 2015-02-15: 80 mg via INTRAVENOUS
  Administered 2015-02-15: 20 mg via INTRAVENOUS
  Administered 2015-02-15: 30 mg via INTRAVENOUS

## 2015-02-15 MED ORDER — DEXTROSE IN LACTATED RINGERS 5 % IV SOLN
INTRAVENOUS | Status: DC
  Administered 2015-02-15: 17:00:00 via INTRAVENOUS
  Administered 2015-02-16: 125 mL/h via INTRAVENOUS
  Administered 2015-02-16: 1000 mL via INTRAVENOUS

## 2015-02-15 MED ORDER — LABETALOL HCL 5 MG/ML IV SOLN
INTRAVENOUS | Status: DC | PRN
  Administered 2015-02-15 (×3): 5 mg via INTRAVENOUS

## 2015-02-15 MED ORDER — CIPROFLOXACIN IN D5W 400 MG/200ML IV SOLN
400.0000 mg | INTRAVENOUS | Status: AC
Start: 2015-02-15 — End: 2015-02-15
  Administered 2015-02-15: 400 mg via INTRAVENOUS

## 2015-02-15 MED ORDER — SUFENTANIL CITRATE 50 MCG/ML IV SOLN
INTRAVENOUS | Status: DC | PRN
  Administered 2015-02-15: 15 ug via INTRAVENOUS
  Administered 2015-02-15: 5 ug via INTRAVENOUS
  Administered 2015-02-15 (×2): 10 ug via INTRAVENOUS
  Administered 2015-02-15: 5 ug via INTRAVENOUS
  Administered 2015-02-15 (×3): 10 ug via INTRAVENOUS
  Administered 2015-02-15 (×2): 15 ug via INTRAVENOUS
  Administered 2015-02-15: 5 ug via INTRAVENOUS
  Administered 2015-02-15 (×2): 10 ug via INTRAVENOUS

## 2015-02-15 MED ORDER — SODIUM CHLORIDE 0.9 % IJ SOLN: 9.0000 mL | INTRAMUSCULAR | Status: DC | PRN

## 2015-02-15 MED ORDER — ONDANSETRON HCL 4 MG/2ML IJ SOLN
INTRAMUSCULAR | Status: AC
  Filled 2015-02-15: qty 2

## 2015-02-15 MED ORDER — HYDROMORPHONE HCL 1 MG/ML IJ SOLN
INTRAMUSCULAR | Status: DC | PRN
  Administered 2015-02-15 (×2): 1 mg via INTRAVENOUS

## 2015-02-15 MED ORDER — DIAZEPAM 5 MG/ML IJ SOLN
5.0000 mg | INTRAMUSCULAR | Status: DC | PRN
  Administered 2015-02-15 – 2015-02-16 (×2): 10 mg via INTRAVENOUS
  Administered 2015-02-17: 5 mg via INTRAVENOUS
  Filled 2015-02-15 (×3): qty 2

## 2015-02-15 MED ORDER — ONDANSETRON HCL 4 MG/2ML IJ SOLN: 4.0000 mg | INTRAMUSCULAR | Status: DC | PRN

## 2015-02-15 MED ORDER — PROMETHAZINE HCL 25 MG/ML IJ SOLN
INTRAMUSCULAR | Status: AC
  Filled 2015-02-15: qty 1

## 2015-02-15 MED ORDER — HYDROMORPHONE HCL 1 MG/ML IJ SOLN
INTRAMUSCULAR | Status: AC
  Filled 2015-02-15: qty 1

## 2015-02-15 MED ORDER — SENNOSIDES-DOCUSATE SODIUM 8.6-50 MG PO TABS
2.0000 | ORAL_TABLET | Freq: Every day | ORAL | Status: DC
  Administered 2015-02-15 – 2015-02-18 (×4): 2 via ORAL
  Filled 2015-02-15 (×6): qty 2

## 2015-02-15 SURGICAL SUPPLY — 62 items
BLADE EXTENDED COATED 6.5IN (ELECTRODE) ×4 IMPLANT
BLADE HEX COATED 2.75 (ELECTRODE) ×2 IMPLANT
CHLORAPREP W/TINT 26ML (MISCELLANEOUS) ×2 IMPLANT
CLIP LIGATING HEM O LOK PURPLE (MISCELLANEOUS) ×8 IMPLANT
CLIP LIGATING HEMO O LOK GREEN (MISCELLANEOUS) ×2 IMPLANT
COVER SURGICAL LIGHT HANDLE (MISCELLANEOUS) ×2 IMPLANT
DECANTER SPIKE VIAL GLASS SM (MISCELLANEOUS) IMPLANT
DISSECTOR ROUND CHERRY 3/8 STR (MISCELLANEOUS) ×2 IMPLANT
DRAIN CHANNEL 15F RND FF 3/16 (WOUND CARE) ×2 IMPLANT
DRAIN CHANNEL RND F F (WOUND CARE) ×2 IMPLANT
DRAIN PENROSE 18X1/2 LTX STRL (DRAIN) IMPLANT
DRAPE LAPAROSCOPIC ABDOMINAL (DRAPES) ×2 IMPLANT
DRAPE SLUSH/WARMER DISC (DRAPES) ×2 IMPLANT
DRAPE UTILITY XL STRL (DRAPES) ×2 IMPLANT
DRSG TELFA 4X10 ISLAND STR (GAUZE/BANDAGES/DRESSINGS) ×4 IMPLANT
ELECT PENCIL ROCKER SW 15FT (MISCELLANEOUS) ×2 IMPLANT
ELECT REM PT RETURN 9FT ADLT (ELECTROSURGICAL) ×2
ELECTRODE REM PT RTRN 9FT ADLT (ELECTROSURGICAL) ×1 IMPLANT
EVACUATOR SILICONE 100CC (DRAIN) ×2 IMPLANT
GAUZE SPONGE 4X4 12PLY STRL (GAUZE/BANDAGES/DRESSINGS) IMPLANT
GAUZE SPONGE 4X4 16PLY XRAY LF (GAUZE/BANDAGES/DRESSINGS) IMPLANT
GLOVE BIOGEL M STRL SZ7.5 (GLOVE) ×2 IMPLANT
GOWN STRL REUS W/TWL LRG LVL3 (GOWN DISPOSABLE) ×4 IMPLANT
HEMOSTAT ARISTA ABSORB 3G PWDR (MISCELLANEOUS) ×2 IMPLANT
HEMOSTAT SURGICEL 4X8 (HEMOSTASIS) ×4 IMPLANT
KIT BASIN OR (CUSTOM PROCEDURE TRAY) ×2 IMPLANT
LOOP VESSEL MAXI BLUE (MISCELLANEOUS) ×2 IMPLANT
NEEDLE HYPO 22GX1.5 SAFETY (NEEDLE) ×2 IMPLANT
NS IRRIG 1000ML POUR BTL (IV SOLUTION) ×2 IMPLANT
PACK GENERAL/GYN (CUSTOM PROCEDURE TRAY) IMPLANT
POSITIONER SURGICAL ARM (MISCELLANEOUS) ×4 IMPLANT
SPONGE LAP 18X18 X RAY DECT (DISPOSABLE) ×8 IMPLANT
SPONGE SURGIFOAM ABS GEL 100 (HEMOSTASIS) IMPLANT
STAPLER VISISTAT 35W (STAPLE) ×4 IMPLANT
SURGIFLO W/THROMBIN 8M KIT (HEMOSTASIS) IMPLANT
SUT ETHILON 3 0 PS 1 (SUTURE) ×4 IMPLANT
SUT MON AB 2-0 SH 27 (SUTURE)
SUT MON AB 2-0 SH27 (SUTURE) IMPLANT
SUT PDS AB 1 TP1 96 (SUTURE) ×4 IMPLANT
SUT PROLENE 4 0 RB 1 (SUTURE) ×1
SUT PROLENE 4-0 RB1 .5 CRCL 36 (SUTURE) ×1 IMPLANT
SUT SILK 0 (SUTURE) ×1
SUT SILK 0 30XBRD TIE 6 (SUTURE) ×1 IMPLANT
SUT SILK 2 0 (SUTURE)
SUT SILK 2-0 30XBRD TIE 12 (SUTURE) IMPLANT
SUT VIC AB 0 CT1 36 (SUTURE) ×12 IMPLANT
SUT VIC AB 2-0 CT2 27 (SUTURE) ×4 IMPLANT
SUT VIC AB 2-0 SH 27 (SUTURE) ×1
SUT VIC AB 2-0 SH 27X BRD (SUTURE) ×1 IMPLANT
SUT VIC AB 3-0 SH 27 (SUTURE) ×2
SUT VIC AB 3-0 SH 27XBRD (SUTURE) ×2 IMPLANT
SUT VIC AB 4-0 RB1 27 (SUTURE)
SUT VIC AB 4-0 RB1 27XBRD (SUTURE) IMPLANT
SYR CONTROL 10ML LL (SYRINGE) ×2 IMPLANT
TAPE UMBILICAL COTTON 1/8X30 (MISCELLANEOUS) IMPLANT
TOWEL OR 17X26 10 PK STRL BLUE (TOWEL DISPOSABLE) ×2 IMPLANT
TOWEL OR NON WOVEN STRL DISP B (DISPOSABLE) ×2 IMPLANT
TRAY FOLEY CATH 16FR SILVER (SET/KITS/TRAYS/PACK) ×2 IMPLANT
TRAY FOLEY CATH 16FRSI W/METER (SET/KITS/TRAYS/PACK) ×2 IMPLANT
TUBING CONNECTING 10 (TUBING) ×2 IMPLANT
URINEMETER 200ML W/220 (MISCELLANEOUS) IMPLANT
WATER STERILE IRR 1500ML POUR (IV SOLUTION) IMPLANT

## 2015-02-15 NOTE — Progress Notes (Addendum)
Called to pt. Room by his son who said that the" drain " was leaking- When I arrived to the room and assessed the pt. I saw that there was bright red blood shadowing the dressing and leaking from the bottom of the dressing onto the bed pad  But the JP drain was not leaking- I re-enforced the dressing and will maintain a close watch to see if there is continued bleeding. If so I will place a call to the surgeon.This took place at 2045.

## 2015-02-15 NOTE — Anesthesia Procedure Notes (Signed)
Procedure Name: Intubation Date/Time: 02/15/2015 10:48 AM Performed by: Lissa Morales Pre-anesthesia Checklist: Patient identified, Emergency Drugs available, Suction available and Patient being monitored Patient Re-evaluated:Patient Re-evaluated prior to inductionOxygen Delivery Method: Circle System Utilized Preoxygenation: Pre-oxygenation with 100% oxygen Intubation Type: IV induction Ventilation: Mask ventilation without difficulty Laryngoscope Size: Mac and 4 Grade View: Grade II Tube type: Oral Tube size: 8.0 mm Number of attempts: 1 Airway Equipment and Method: Stylet and Oral airway Placement Confirmation: ETT inserted through vocal cords under direct vision,  positive ETCO2 and breath sounds checked- equal and bilateral Secured at: 22 cm Tube secured with: Tape Dental Injury: Teeth and Oropharynx as per pre-operative assessment

## 2015-02-15 NOTE — H&P (Signed)
Reason For Visit f/u for left renal cyst   History of Present Illness 89M referred by Dr. Risa Grill for further consideration and management of his complex left renal cyst. This was initially evaluated and treated in summer of 2015. He presented with flank pain/ upper abdominal pain with associated epigastric pain. CT scan demonstrated a large left renal cyst was read as a bosniak 2 cyst and subsequent drained. His symptoms improved significantly. He then was seen in follow-up 6 months later and a renal u/s showed recurrence of the cyst with some complex features. He then had a repeat CT scan which was read as a bosniak 4 cyst - with necrotic material centrally with enhancing within the cyst and the borders.   Interval: The patient states that he has a constant sharp pain in his left upper quadrant which is made particularly worse with bending and twisting. He describes it as a wrenching pain. He has associated early satiety. The patient denies any gross hematuria or associated flank pain. He denies any weight loss or new bone pains. The patient does have history of rheumatoid arthritis. He also has a remote history of melanoma 14 years ago.  The patient also describes a recent history of crushing chest pain radiating to his left arm. This subsided after a period of time and taking pain medications. The patient has not had a physical and at least 4 years, he has lost touch with his primary care doctor.   Past Medical History Problems  1. History of heartburn (Z87.898) 2. History of malignant melanoma (Z85.820) 3. History of sleep apnea (Z87.09)  Surgical History Problems  1. History of Destruction Of Malignant Lesion  Current Meds 1. Hydrocodone-Acetaminophen 5-325 MG Oral Tablet; Take 1-2 tablets every 4-6 hours for  pain;  Therapy: [phone] to (Last Rx:02Dec2016) Ordered 2. Omeprazole 20 MG Oral Capsule Delayed Release;  Therapy: (Recorded:23Jun2015) to Recorded 3. TraMADol HCl - 50 MG Oral  Tablet; 1 po q 6 prn;  Therapy: [id number] to (Last Rx:18Nov2016) Ordered 4. TraMADol HCl - 50 MG Oral Tablet; TAKE 1 TABLET 3 TIMES DAILY AS NEEDED;  Therapy: [id number] to (Complete:11Dec2016); Last Rx:01Dec2016 Ordered  Allergies Medication  1. Sulfa Drugs  Family History Problems  1. Family history of COPD (chronic obstructive pulmonary disease) with emphysema :  Father 2. Family history of hypertension (Z82.49) : Brother, Sister 3. Family history of lung cancer (Z80.1) : Mother  Social History Problems  1. Alcohol use (Z78.9)   socially 2. Caffeine use (F15.90)   1 3. Current every day smoker (F17.200) 4. Father deceased   43yrs 5. Married 6. Mother's age   56yrs 7. Occupation   self employed 8. Three children  Vitals Vital Signs [Data Includes: Last 1 Day]  Recorded: [id number] 08:25AM  Height: 6 ft 1 in Weight: 212 lb  BMI Calculated: 27.97 BSA Calculated: 2.21 Blood Pressure: 158 / 100 Temperature: 97.7 F Heart Rate: 102  Physical Exam Constitutional: Well nourished and well developed . No acute distress.  ENT:. The ears and nose are normal in appearance.  Neck: The appearance of the neck is normal and no neck mass is present.  Cardiovascular: Heart rate and rhythm are normal . No peripheral edema.  Abdomen: A 15 cm mass is palpable. Tenderness in the LUQ is present. No CVA tenderness. No hernias are palpable. No hepatosplenomegaly noted.  Lymphatics: The femoral and inguinal nodes are not enlarged or tender.  Skin: Normal skin turgor, no visible rash and no visible skin lesions.  Neuro/Psych:. Mood and affect are appropriate.    Results/Data Urine [Data Includes: Last 1 Day]   [id number]  COLOR YELLOW   APPEARANCE CLEAR   SPECIFIC GRAVITY 1.020   pH 5.0   GLUCOSE NEGATIVE   BILIRUBIN NEGATIVE   KETONE NEGATIVE   BLOOD NEGATIVE   PROTEIN NEGATIVE   NITRITE NEGATIVE   LEUKOCYTE ESTERASE NEGATIVE    Urinalysis is normal   Plan Health  Maintenance  1. UA With REFLEX; [Do Not Release]; Status:Resulted - Requires Verification;   [id number] 08:10AM Left flank pain  2. Follow-up Schedule Surgery Office  Follow-up  Status: Hold For - Appointment   Requested for: [phone] Renal cyst, acquired  3. Renew: Hydrocodone-Acetaminophen 5-325 MG Oral Tablet; Take 1-2 tablets every 4-6  hours for pain 4. Follow-up Month x 6 Office  Follow-up  Status: Canceled 5. RENAL U/S LEFT; Status:Canceled;   Discussion/Summary The patient has a symptomatic left renal mass with some discrepancy in the classification of the cyst. It has been drained once 18 months ago as it was described a Bosniak 2 cyst. The cyst has returned and most recent imaging suggests that it enhances and is rather a Bosniak 4 cyst. We discussed the implications of this, I expressed my concern for malignancy. Further given the size of the mass, I don't think that this is something that is amenable to laparoscopic surgery. We discussed treatment options, including laparoscopic exploration and drainage of the cyst followed by decortication/partial nephrectomy, laparoscopic radical nephrectomy, and an open radical nephrectomy. Our plan is to present this case to the genitourinary tumor board and ultimately come up with a conference treatment plan.  In the meantime, the patient does need to reestablish care with his primary care provider. In particular, I'm concerned that he is having anginal symptoms and may need a cardiac evaluation. Further, the patient is in the process of obtaining insurance. As such, we would likely push off any procedure until next year.

## 2015-02-15 NOTE — Op Note (Signed)
Preoperative diagnosis:  1. Left cystic renal mass   Postoperative diagnosis:  1. Same   Procedure: 1. Open left partial nephrectomy  Surgeon: Ardis Hughs, MD First assistant: Dr. Harvel Ricks, M.D.  Anesthesia: General  Complications: None  Intraoperative findings: Cold ischemic renal artery clamp time was 34 minutes.  Base of kidney tumor was sent for frozen section and was negative for malignancy.  EBL: 1300 mL  Specimens: Left renal mass  Indication: James [name] is a 54 y.o. patient with large left cystic renal mass concerning for malignancy.  After reviewing the management options for treatment, he elected to proceed with the above surgical procedure(s). We have discussed the potential benefits and risks of the procedure, side effects of the proposed treatment, the likelihood of the patient achieving the goals of the procedure, and any potential problems that might occur during the procedure or recuperation. Informed consent has been obtained.  Description of procedure:  The patient was taken to the operating room and general anesthesia was induced.  A small bump was then placed under the patient's left flank. He was then prepped and draped in the routine sterile fashion. A timeout was then held. A subcostal incision was then made with a 10 blade taken down through the various muscle layers and into the peritoneum. The colon was then reflected medially off of the renal mass. The renal mass was then freed from the surrounding attachments so that it could be lifted into the incision. Once it was mobile enough to pull up and out of the incision we then dissected medially down to the renal hilum. At this point while dissecting out the renal vein we did pulse the adrenal vein requiring a 4-0 Prolene figure-of-eight repair to control the bleeding. Once the hilum and been dissected out 2 bulldog clamps were placed on the renal artery. A bowel bag was then placed over the kidney  and cinched around the hilum. The bag was then opened at the top and slushed saline was packed around the kidney. Once the kidney had cooled with began dissecting out the mass from the surrounding structures and off the kidney. The margins were dissected sharply with Metzenbaum scissors. Once the mass had been freed and removed from the specimen we close the parenchymal layer with 3-0 Vicryl in a running fashion. We did take a small area of the base and sent it separately as a frozen section which ultimately returned negative. There was a small hole in the collecting system which we closed separately with 3-0 Vicryl. Once the parenchyma had been reapproximated and hemostasis achieved we closed the capsular layer with 2-0 Vicryl between Weck clips bringing the cut ends of the parenchyma together. We used a small Surgicel bolster. We then reapproximated Gerotas fascia over top of the resected site adding a third layer of support. The clips were then removed with the ischemic time of 34 minutes cold ischemia. We then revisited several areas intraperitoneally where there was some oozing. The spleen appeared to be controlled, we did add a Surgicel piece to this area. We also used Arista along the hilum as well as in the splenic area. Things were noted to be hemostatic at this time and as such we proceeded with closure. Prior to closing we placed a 19 [place] Blake drain in the paracolic gutter around the resection site. The innermost muscle layer was closed with 0 Vicryls and a figure-of-eight fashion. The interfascial layer was closed with 10 looped PDS, the outer fascial  layer was closed in a similar fashion. Long-acting local anesthetic was injected into the musculocutaneous nerves anterior to the innermost layer of muscle. The skin was then reapproximated with surgical staples. The remaining extra was then injected into the incision. A 3-0 nylon suture was used to tie down the drain. An island dressing was then placed  over the suture line.  The patient was subsequently extubated and returned the PACU and excellent condition.    Ardis Hughs, M.D.

## 2015-02-15 NOTE — Anesthesia Preprocedure Evaluation (Addendum)
Anesthesia Evaluation  Patient identified by MRN, date of birth, ID band Patient awake    Reviewed: Allergy & Precautions, H&P , NPO status , Patient's Chart, lab work & pertinent test results  History of Anesthesia Complications Negative for: history of anesthetic complications  Airway Mallampati: III  TM Distance: >3 FB Neck ROM: full    Dental  (+) Poor Dentition, Dental Advisory Given Nicotine stained, non loose:   Pulmonary Current Smoker,    Pulmonary exam normal breath sounds clear to auscultation       Cardiovascular hypertension, negative cardio ROS Normal cardiovascular exam Rhythm:regular Rate:Normal     Neuro/Psych PSYCHIATRIC DISORDERS negative neurological ROS     GI/Hepatic GERD  ,(+)     substance abuse  alcohol use,   Endo/Other  negative endocrine ROS  Renal/GU negative Renal ROS     Musculoskeletal   Abdominal   Peds  Hematology negative hematology ROS (+)   Anesthesia Other Findings Patient has chronic pain related to left renal mass, takes oxycodone 10mg  every 6 hours  Reproductive/Obstetrics negative OB ROS                            Anesthesia Physical Anesthesia Plan  ASA: III  Anesthesia Plan: General   Post-op Pain Management:    Induction: Intravenous  Airway Management Planned: Oral ETT  Additional Equipment:   Intra-op Plan:   Post-operative Plan: Extubation in OR  Informed Consent: I have reviewed the patients History and Physical, chart, labs and discussed the procedure including the risks, benefits and alternatives for the proposed anesthesia with the patient or authorized representative who has indicated his/her understanding and acceptance.   Dental Advisory Given  Plan Discussed with: Anesthesiologist and CRNA  Anesthesia Plan Comments: (2 PIVs please  Consider ketamine adjunct or lidocaine infusion for periop multimodal analgesia)        Anesthesia Quick Evaluation

## 2015-02-15 NOTE — Transfer of Care (Signed)
Immediate Anesthesia Transfer of Care Note  Patient: James [name]  Procedure(s) Performed: Procedure(s): LEFT OPEN PARTIAL NEPHRECTOMY (Left)  Patient Location: PACU  Anesthesia Type:General  Level of Consciousness: awake, alert , oriented and unresponsive  Airway & Oxygen Therapy: Patient Spontanous Breathing and Patient connected to face mask oxygen  Post-op Assessment: Report given to RN, Post -op Vital signs reviewed and stable and Patient moving all extremities X 4  Post vital signs: stable  Last Vitals:  Filed Vitals:   02/15/15 1530 02/15/15 1540  BP: 146/100   Pulse: 87 90  Temp:    Resp: 13 16    Complications: No apparent anesthesia complications

## 2015-02-15 NOTE — Progress Notes (Signed)
Recheck of surgery site reveals no further bleeding but evaluation is ongoing for any changes  and the  charge nurse C.Sharlet Salina RN  also evaluated the site and was notified of earlier finding.

## 2015-02-16 ENCOUNTER — Encounter (HOSPITAL_COMMUNITY): Payer: Self-pay | Admitting: Urology

## 2015-02-16 LAB — BASIC METABOLIC PANEL
Anion gap: 8 (ref 5–15)
BUN: 12 mg/dL (ref 6–20)
CO2: 25 mmol/L (ref 22–32)
Calcium: 7.9 mg/dL — ABNORMAL LOW (ref 8.9–10.3)
Chloride: 106 mmol/L (ref 101–111)
Creatinine, Ser: 1.13 mg/dL (ref 0.61–1.24)
GFR calc Af Amer: >60 mL/min (ref >60)
GFR calc non Af Amer: >60 mL/min (ref >60)
Glucose, Bld: 170 mg/dL — ABNORMAL HIGH (ref 65–99)
Potassium: 4 mmol/L (ref 3.5–5.1)
Sodium: 139 mmol/L (ref 135–145)

## 2015-02-16 LAB — CBC
HCT: 28.9 % — ABNORMAL LOW (ref 39.0–52.0)
Hemoglobin: 10 g/dL — ABNORMAL LOW (ref 13.0–17.0)
MCH: 31.5 pg (ref 26.0–34.0)
MCHC: 34.6 g/dL (ref 30.0–36.0)
MCV: 91.2 fL (ref 78.0–100.0)
Platelets: 138 10*3/uL — ABNORMAL LOW (ref 150–400)
RBC: 3.17 MIL/uL — ABNORMAL LOW (ref 4.22–5.81)
RDW: 16 % — ABNORMAL HIGH (ref 11.5–15.5)
WBC: 6.9 10*3/uL (ref 4.0–10.5)

## 2015-02-16 MED ORDER — DEXTROSE-NACL 5-0.45 % IV SOLN: INTRAVENOUS | Status: DC

## 2015-02-16 MED ORDER — KCL IN DEXTROSE-NACL 20-5-0.45 MEQ/L-%-% IV SOLN
INTRAVENOUS | Status: DC
  Administered 2015-02-16 – 2015-02-18 (×5): via INTRAVENOUS
  Filled 2015-02-16 (×8): qty 1000

## 2015-02-16 MED ORDER — CHLORDIAZEPOXIDE HCL 10 MG PO CAPS
10.0000 mg | ORAL_CAPSULE | Freq: Three times a day (TID) | ORAL | Status: DC
  Administered 2015-02-16 – 2015-02-19 (×10): 10 mg via ORAL
  Filled 2015-02-16 (×10): qty 1

## 2015-02-16 MED ORDER — MORPHINE SULFATE (PF) 4 MG/ML IV SOLN
4.0000 mg | INTRAVENOUS | Status: DC | PRN
  Administered 2015-02-17 (×3): 4 mg via INTRAVENOUS
  Filled 2015-02-16 (×3): qty 1

## 2015-02-16 MED ORDER — KCL IN DEXTROSE-NACL 20-5-0.45 MEQ/L-%-% IV SOLN: INTRAVENOUS | Status: DC

## 2015-02-16 MED ORDER — BISACODYL 10 MG RE SUPP
10.0000 mg | Freq: Once | RECTAL | Status: AC
  Administered 2015-02-16: 10 mg via RECTAL
  Filled 2015-02-16: qty 1

## 2015-02-16 NOTE — Progress Notes (Signed)
1 Day Post-Op Subjective: Patient has been ambulating in the halls and using PO narcotics. Continued issues with pain. HR was up to 117 - he was getting OOB at this time. He voided 100 mL and later 200 mL after foley catheter removal but felt uncomfortable and bladder scan demonstrated 600 mL. He was I&O catheterized for 600 mL this afternoon. Got suppository but still no F/BM. Tolerating full liquids without N/V. Minimal JP output today and decreased drainage around JP drain.  Objective: Vital signs in last 24 hours: Temp:  [97.3 F (36.3 C)-98.6 F (37 C)] 98 F (36.7 C) (12/22 1200) Pulse Rate:  [81-117] 117 (12/22 1126) Resp:  [12-23] 16 (12/22 1448) BP: (129-157)/(71-95) 143/85 mmHg (12/22 1126) SpO2:  [95 %-100 %] 97 % (12/22 1448) Weight:  [94 kg (207 lb 3.7 oz)] 94 kg (207 lb 3.7 oz) (12/22 0703)  Intake/Output from previous day: 12/21 0701 - 12/22 0700 In: 10825 [I.V.:9825; Blood:750; IV Piggyback:250] Out: 7240 [Urine:5000; Drains:440; Blood:1800] Intake/Output this shift: Total I/O In: 1928.5 [I.V.:628.5; Other:1200; IV Piggyback:100] Out: 910 [Urine:900; Drains:10]  Physical Exam:  General:alert, cooperative and appears stated age GI: firm, somewhat distended, appropriately tender to palpation. Dressing is C/D/I. Some leaking of SS output around JP drain with reinforced dressing and JP drain with SS output inside. Male genitalia: foley out  Extremities: extremities normal, atraumatic, no cyanosis or edema  Lab Results:  Recent Labs  02/15/15 1411 02/15/15 1537 02/16/15 0335  HGB 8.8* 11.2* 10.0*  HCT 26.0* 32.6* 28.9*   BMET  Recent Labs  02/15/15 1537 02/16/15 0335  NA 141 139  K 4.1 4.0  CL 110 106  CO2 23 25  GLUCOSE 167* 170*  BUN 10 12  CREATININE 1.01 1.13  CALCIUM 7.8* 7.9*   No results for input(s): LABPT, INR in the last 72 hours. No results for input(s): LABURIN in the last 72 hours. Results for orders placed or performed during the  hospital encounter of 07/14/12  Blood culture (routine x 2)     Status: None   Collection Time: 07/14/12 11:00 AM  Result Value Ref Range Status   Specimen Description BLOOD LEFT ANTECUBITAL  Final   Special Requests BOTTLES DRAWN AEROBIC AND ANAEROBIC 2.5CC  Final   Culture  Setup Time 07/14/2012 14:24  Final   Culture NO GROWTH 5 DAYS  Final   Report Status 07/20/2012 FINAL  Final  Blood culture (routine x 2)     Status: None   Collection Time: 07/14/12 11:23 AM  Result Value Ref Range Status   Specimen Description BLOOD RIGHT ANTECUBITAL  Final   Special Requests BOTTLES DRAWN AEROBIC AND ANAEROBIC 3CC  Final   Culture  Setup Time 07/14/2012 14:24  Final   Culture NO GROWTH 5 DAYS  Final   Report Status 07/20/2012 FINAL  Final    Studies/Results: No results found.  Assessment/Plan: 1 Day Post-Op Procedure(s) (LRB): LEFT OPEN PARTIAL NEPHRECTOMY (Left)   POD#1 open left partial nephrectomy. S/p intra-operative transfusion of 2u pRBC with hemoglobin of 10 today.   Advance to regular diet  [place]  Bladder scan q4-6 hours if no void  D/C PCA  Encourage PO narcotics  PRN IV morphine  Consider lidocaine patches for continued pain  Ambulate, IS  Labs tomorrow  Bisacodyl suppository tomorrow  Scheduled librium  CIWA  SCDs at all times  Possible D/C tomorrow   LOS: 1 day   James [name] 02/16/2015, 4:37 PM

## 2015-02-16 NOTE — Progress Notes (Signed)
1 Day Post-Op Subjective: Patient reports significant pain overnight and inability to sleep. Tolerating clears with no N/V. No F/BMs. Has been on bedrest. Dressing had to be reinforced around JP drain overnight due to leaking around JP drain.  Objective: Vital signs in last 24 hours: Temp:  [97.2 F (36.2 C)-98.6 F (37 C)] 98.4 F (36.9 C) (12/22 0400) Pulse Rate:  [81-100] 90 (12/22 0600) Resp:  [0-20] 16 (12/22 0600) BP: (117-157)/(71-103) 143/84 mmHg (12/22 0600) SpO2:  [93 %-100 %] 99 % (12/22 0600) Arterial Line BP: (140-158)/(69-90) 141/69 mmHg (12/21 1615) Weight:  [92.194 kg (203 lb 4 oz)-94 kg (207 lb 3.7 oz)] 94 kg (207 lb 3.7 oz) (12/22 0703)  Intake/Output from previous day: 12/21 0701 - 12/22 0700 In: 10825 [I.V.:9825; Blood:750; IV Piggyback:250] Out: 7240 [Urine:5000; Drains:440; Blood:1800] Intake/Output this shift:    Physical Exam:  General:alert, cooperative and appears stated age GI: firm, somewhat distended, appropriately tender to palpation. Dressing is C/D/I. Some leaking of SS output around JP drain with reinforced dressing and JP drain with SS output inside. Male genitalia: foley catheter in place with clear, yellow urine Extremities: extremities normal, atraumatic, no cyanosis or edema  Lab Results:  Recent Labs  02/15/15 1411 02/15/15 1537 02/16/15 0335  HGB 8.8* 11.2* 10.0*  HCT 26.0* 32.6* 28.9*   BMET  Recent Labs  02/15/15 1537 02/16/15 0335  NA 141 139  K 4.1 4.0  CL 110 106  CO2 23 25  GLUCOSE 167* 170*  BUN 10 12  CREATININE 1.01 1.13  CALCIUM 7.8* 7.9*   No results for input(s): LABPT, INR in the last 72 hours. No results for input(s): LABURIN in the last 72 hours. Results for orders placed or performed during the hospital encounter of 07/14/12  Blood culture (routine x 2)     Status: None   Collection Time: 07/14/12 11:00 AM  Result Value Ref Range Status   Specimen Description BLOOD LEFT ANTECUBITAL  Final   Special  Requests BOTTLES DRAWN AEROBIC AND ANAEROBIC 2.5CC  Final   Culture  Setup Time 07/14/2012 14:24  Final   Culture NO GROWTH 5 DAYS  Final   Report Status 07/20/2012 FINAL  Final  Blood culture (routine x 2)     Status: None   Collection Time: 07/14/12 11:23 AM  Result Value Ref Range Status   Specimen Description BLOOD RIGHT ANTECUBITAL  Final   Special Requests BOTTLES DRAWN AEROBIC AND ANAEROBIC 3CC  Final   Culture  Setup Time 07/14/2012 14:24  Final   Culture NO GROWTH 5 DAYS  Final   Report Status 07/20/2012 FINAL  Final    Studies/Results: No results found.  Assessment/Plan: 1 Day Post-Op Procedure(s) (LRB): LEFT OPEN PARTIAL NEPHRECTOMY (Left)   POD#1 open left partial nephrectomy. S/p intra-operative transfusion of 2u pRBC with hemoglobin of 10 today.   Advance to full liquids  Decrease mIVF to 75  Ambulate, IS  Wean O2  Labs tomorrow  D/C foley  F/u TOV  Wean PCA  Encourage PO narcotics  Consider lidocaine patches for continued pain  Bisacodyl suppository today  CIWA  SCDs at all times  Possible D/C tomorrow   LOS: 1 day   James [name] 02/16/2015, 7:23 AM

## 2015-02-16 NOTE — Progress Notes (Signed)
0155- Dressing re-enforced for serosanguineous drainage.

## 2015-02-16 NOTE — Anesthesia Postprocedure Evaluation (Signed)
Anesthesia Post Note  Patient: James [name]  Procedure(s) Performed: Procedure(s) (LRB): LEFT OPEN PARTIAL NEPHRECTOMY (Left)  Patient location during evaluation: PACU Anesthesia Type: General Level of consciousness: awake and alert Pain management: pain level controlled Vital Signs Assessment: post-procedure vital signs reviewed and stable Respiratory status: spontaneous breathing, nonlabored ventilation, respiratory function stable and patient connected to nasal cannula oxygen Cardiovascular status: blood pressure returned to baseline and stable Postop Assessment: no signs of nausea or vomiting Anesthetic complications: no    Last Vitals:  Filed Vitals:   02/16/15 0200 02/16/15 0400  BP: 157/86   Pulse: 96   Temp:  36.9 C  Resp: 20     Last Pain:  Filed Vitals:   02/16/15 0456  PainSc: 0-No pain                 DENENNY,BRUCE J

## 2015-02-16 NOTE — Care Management Note (Signed)
Case Management Note  Patient Details  Name: RAYFUS SWEDLUND MRN: [id number] Date of Birth: [birth year]  Subjective/Objective:         Invasive renal surg with post op hemodynamic monitoring           Action/Plan:Date: February 16, 2015 Chart reviewed for concurrent status and case management needs. Will continue to follow patient for changes and needs: Velva Harman, RN, BSN, [place]   [phone]   Expected Discharge Date:                  Expected Discharge Plan:     In-House Referral:     Discharge planning Services     Post Acute Care Choice:    Choice offered to:     DME Arranged:    DME Agency:     HH Arranged:    [place] Agency:     Status of Service:     Medicare Important Message Given:    Date Medicare IM Given:    Medicare IM give by:    Date Additional Medicare IM Given:    Additional Medicare Important Message give by:     If discussed at [place] of Stay Meetings, dates discussed:    Additional Comments:  Leeroy Cha, RN 02/16/2015, 9:53 AM

## 2015-02-17 LAB — BASIC METABOLIC PANEL
Anion gap: 8 (ref 5–15)
BUN: 10 mg/dL (ref 6–20)
CO2: 27 mmol/L (ref 22–32)
Calcium: 8.9 mg/dL (ref 8.9–10.3)
Chloride: 103 mmol/L (ref 101–111)
Creatinine, Ser: 1.22 mg/dL (ref 0.61–1.24)
GFR calc Af Amer: >60 mL/min (ref >60)
GFR calc non Af Amer: >60 mL/min (ref >60)
Glucose, Bld: 154 mg/dL — ABNORMAL HIGH (ref 65–99)
Potassium: 3.5 mmol/L (ref 3.5–5.1)
Sodium: 138 mmol/L (ref 135–145)

## 2015-02-17 LAB — HEMOGLOBIN AND HEMATOCRIT, BLOOD
HCT: 28.9 % — ABNORMAL LOW (ref 39.0–52.0)
Hemoglobin: 9.7 g/dL — ABNORMAL LOW (ref 13.0–17.0)

## 2015-02-17 LAB — CREATININE, FLUID (PLEURAL, PERITONEAL, JP DRAINAGE): Creat, Fluid: 1.2 mg/dL

## 2015-02-17 MED ORDER — FAMOTIDINE IN NACL 20-0.9 MG/50ML-% IV SOLN
20.0000 mg | Freq: Two times a day (BID) | INTRAVENOUS | Status: DC
  Administered 2015-02-17 – 2015-02-19 (×5): 20 mg via INTRAVENOUS
  Filled 2015-02-17 (×5): qty 50

## 2015-02-17 MED ORDER — ALUM & MAG HYDROXIDE-SIMETH 200-200-20 MG/5ML PO SUSP
30.0000 mL | Freq: Once | ORAL | Status: AC
  Administered 2015-02-17: 30 mL via ORAL
  Filled 2015-02-17: qty 30

## 2015-02-17 MED ORDER — DOCUSATE SODIUM 100 MG PO CAPS: 100.0000 mg | ORAL_CAPSULE | Freq: Two times a day (BID) | ORAL | Status: DC

## 2015-02-17 MED ORDER — BISACODYL 10 MG RE SUPP
10.0000 mg | Freq: Once | RECTAL | Status: AC
  Administered 2015-02-17: 10 mg via RECTAL
  Filled 2015-02-17: qty 1

## 2015-02-17 MED ORDER — OXYCODONE HCL 5 MG PO TABS: 5.0000 mg | ORAL_TABLET | ORAL | Status: DC | PRN

## 2015-02-17 MED ORDER — LIDOCAINE 5 % EX PTCH
1.0000 | MEDICATED_PATCH | CUTANEOUS | Status: DC
  Administered 2015-02-17 – 2015-02-19 (×3): 1 via TRANSDERMAL
  Filled 2015-02-17 (×4): qty 1

## 2015-02-17 MED ORDER — HEPARIN SODIUM (PORCINE) 5000 UNIT/ML IJ SOLN
5000.0000 [IU] | Freq: Three times a day (TID) | INTRAMUSCULAR | Status: DC
  Administered 2015-02-17 – 2015-02-19 (×6): 5000 [IU] via SUBCUTANEOUS
  Filled 2015-02-17 (×7): qty 1

## 2015-02-17 MED ORDER — FAMOTIDINE IN NACL 20-0.9 MG/50ML-% IV SOLN: 20.0000 mg | Freq: Two times a day (BID) | INTRAVENOUS | Status: DC

## 2015-02-17 MED ORDER — ONDANSETRON HCL 4 MG/2ML IJ SOLN
4.0000 mg | INTRAMUSCULAR | Status: DC | PRN
  Administered 2015-02-17 (×2): 4 mg via INTRAVENOUS
  Filled 2015-02-17 (×2): qty 2

## 2015-02-17 MED ORDER — OMEPRAZOLE 20 MG PO CPDR
20.0000 mg | DELAYED_RELEASE_CAPSULE | Freq: Two times a day (BID) | ORAL | Status: DC
  Administered 2015-02-17 – 2015-02-19 (×5): 20 mg via ORAL
  Filled 2015-02-17 (×5): qty 1

## 2015-02-17 MED ORDER — METOCLOPRAMIDE HCL 5 MG/ML IJ SOLN
10.0000 mg | Freq: Three times a day (TID) | INTRAMUSCULAR | Status: DC
  Administered 2015-02-17 – 2015-02-19 (×7): 10 mg via INTRAVENOUS
  Filled 2015-02-17 (×10): qty 2

## 2015-02-17 NOTE — Discharge Instructions (Signed)
1.  Activity:  You are encouraged to ambulate frequently (about every hour during waking hours) to help prevent blood clots from forming in your legs or lungs.  However, you should not engage in any heavy lifting (> 10-15 lbs), strenuous activity, or straining. 2. Diet: You should advance your diet as instructed by your physician.  It will be normal to have some bloating, nausea, and abdominal discomfort intermittently. 3. Prescriptions:  You will be provided a prescription for pain medication to take as needed.  If your pain is not severe enough to require the prescription pain medication, you may take extra strength Tylenol instead which will have less side effects.  You should also take a prescribed stool softener to avoid straining with bowel movements as the prescription pain medication may constipate you. 4. Incisions: You may remove your dressing bandages 48 hours after surgery if not removed in the hospital.  You will either have some small staples or special tissue glue at each of the incision sites. Once the bandages are removed (if present), the incisions may stay open to air.  You may start showering (but not soaking or bathing in water) the 2nd day after surgery and the incisions simply need to be patted dry after the shower.  No additional care is needed. 5. What to call us about: You should call the office [phone]) if you develop fever > 101 or develop persistent vomiting.  Avoid any aspirin, ibuprofen, naproxene for at least 7 days following surgery as this can increase bleeding risk.

## 2015-02-17 NOTE — Progress Notes (Signed)
2 Days Post-Op   Subjective: Reflux improved with addition of Reglan and ranitidine. No more N/V. Per nursing he has been refusing to ambulate. Continued issues with pain. Intermittent tachycardia today. He denies difficulty voiding overnight. Denies F/BM. Low JP output and decreased drainage around JP drain.  Objective: Vital signs in last 24 hours: Temp:  [98 F (36.7 C)-98.7 F (37.1 C)] 98.7 F (37.1 C) (12/23 0640) Pulse Rate:  [100-113] 107 (12/23 0640) Resp:  [16-24] 20 (12/23 0640) BP: (143-168)/(90-106) 168/106 mmHg (12/23 0640) SpO2:  [87 %-98 %] 97 % (12/23 0640) Weight:  [93.6 kg (206 lb 5.6 oz)] 93.6 kg (206 lb 5.6 oz) (12/22 1935)  Intake/Output from previous day: 12/22 0701 - 12/23 0700 In: 4016 [P.O.:1020; I.V.:1396; IV Piggyback:400] Out: 2580 [Urine:2500; Drains:80] Intake/Output this shift: Total I/O In: -  Out: [id number]; Drains:55]  Physical Exam:  General:alert, cooperative and appears stated age GI: firm, somewhat distended, appropriately tender to palpation. Incision is C/D/I. Some leaking of SS output around JP drain with reinforced dressing and JP drain with SS output inside. Male genitalia: foley out  Extremities: extremities normal, atraumatic, no cyanosis or edema  Lab Results:  Recent Labs  02/15/15 1537 02/16/15 0335 02/17/15 0435  HGB 11.2* 10.0* 9.7*  HCT 32.6* 28.9* 28.9*   BMET  Recent Labs  02/16/15 0335 02/17/15 0435  NA 139 138  K 4.0 3.5  CL 106 103  CO2 25 27  GLUCOSE 170* 154*  BUN 12 10  CREATININE 1.13 1.22  CALCIUM 7.9* 8.9   No results for input(s): LABPT, INR in the last 72 hours. No results for input(s): LABURIN in the last 72 hours. Results for orders placed or performed during the hospital encounter of 07/14/12  Blood culture (routine x 2)     Status: None   Collection Time: 07/14/12 11:00 AM  Result Value Ref Range Status   Specimen Description BLOOD LEFT ANTECUBITAL  Final   Special Requests  BOTTLES DRAWN AEROBIC AND ANAEROBIC 2.5CC  Final   Culture  Setup Time 07/14/2012 14:24  Final   Culture NO GROWTH 5 DAYS  Final   Report Status 07/20/2012 FINAL  Final  Blood culture (routine x 2)     Status: None   Collection Time: 07/14/12 11:23 AM  Result Value Ref Range Status   Specimen Description BLOOD RIGHT ANTECUBITAL  Final   Special Requests BOTTLES DRAWN AEROBIC AND ANAEROBIC 3CC  Final   Culture  Setup Time 07/14/2012 14:24  Final   Culture NO GROWTH 5 DAYS  Final   Report Status 07/20/2012 FINAL  Final    Studies/Results: No results found.  Assessment/Plan: 2 Days Post-Op Procedure(s) (LRB): LEFT OPEN PARTIAL NEPHRECTOMY (Left)   POD#2 open left partial nephrectomy. S/p intra-operative transfusion of 2u pRBC. Hemoglobin remains stable.   Continue clears, will consider advancing for dinner  mIVF @ 125  Ranitidine IV  Reglan IV  Continue protonix  Ordered ompeprazole as non-formulary - however this may take 72 hours to obtain  Encourage PO narcotics  PRN IV morphine  Lidocaine patches for continued pain  Ambulate, IS  Labs tomorrow  Bisacodyl suppository today  Scheduled librium  CIWA  SCDs at all times   LOS: 2 days   Acie Fredrickson 02/17/2015, 2:33 PM

## 2015-02-17 NOTE — Progress Notes (Signed)
Pt vomited x1, pt requesting something for acid reflux, MD paged, orders given. Will continue to monitor.   0450am- pt nauseous/vomited, meds given per CIWA for withdrawal symptoms. Will continue to monitor.

## 2015-02-17 NOTE — Progress Notes (Signed)
RN and NT attempted to ambulate patient.  Patient refused to ambulate and said " I am too tired and sleepy to walk."  RN encouraged patient and educated him on the importance of ambulating.  Patient verbalizes understanding, but still refused.  Will continue to monitor patient and encourage ambulation.

## 2015-02-17 NOTE — Progress Notes (Signed)
2 Days Post-Op   Subjective: Patient complaining of severe reflux overnight causing nausea and vomiting. He is upset that omeprazole is non-formulary here. He has been ambulating in the halls and using PO narcotics but reports throwing them up. Continued issues with pain. Intermittent tachycardia overnight. He denies difficulty voiding overnight. Denies F/BM. Reports nausea and vomiting with regular diet but again attributes this to reflux. Low JP output and decreased drainage around JP drain.  Objective: Vital signs in last 24 hours: Temp:  [97.3 F (36.3 C)-98.4 F (36.9 C)] 98.4 F (36.9 C) (12/22 1935) Pulse Rate:  [84-117] 101 (12/22 1935) Resp:  [12-24] 20 (12/22 1935) BP: (138-153)/(83-95) 143/90 mmHg (12/22 1935) SpO2:  [87 %-99 %] 98 % (12/22 1935) Weight:  [93.6 kg (206 lb 5.6 oz)-94 kg (207 lb 3.7 oz)] 93.6 kg (206 lb 5.6 oz) (12/22 1935)  Intake/Output from previous day: 12/22 0701 - 12/23 0700 In: Platter [P.O.:1020; I.V.:796; IV Piggyback:200] Out: 2580 [Urine:2500; Drains:80] Intake/Output this shift: Total I/O In: 487.5 [P.O.:220; I.V.:167.5; IV Piggyback:100] Out: 1055 [Urine:1000; Drains:55]  Physical Exam:  General:alert, cooperative and appears stated age GI: firm, somewhat distended, appropriately tender to palpation. Incision is C/D/I. Some leaking of SS output around JP drain with reinforced dressing and JP drain with SS output inside. Male genitalia: foley out  Extremities: extremities normal, atraumatic, no cyanosis or edema  Lab Results:  Recent Labs  02/15/15 1537 02/16/15 0335 02/17/15 0435  HGB 11.2* 10.0* 9.7*  HCT 32.6* 28.9* 28.9*   BMET  Recent Labs  02/16/15 0335 02/17/15 0435  NA 139 138  K 4.0 3.5  CL 106 103  CO2 25 27  GLUCOSE 170* 154*  BUN 12 10  CREATININE 1.13 1.22  CALCIUM 7.9* 8.9   No results for input(s): LABPT, INR in the last 72 hours. No results for input(s): LABURIN in the last 72 hours. Results for orders  placed or performed during the hospital encounter of 07/14/12  Blood culture (routine x 2)     Status: None   Collection Time: 07/14/12 11:00 AM  Result Value Ref Range Status   Specimen Description BLOOD LEFT ANTECUBITAL  Final   Special Requests BOTTLES DRAWN AEROBIC AND ANAEROBIC 2.5CC  Final   Culture  Setup Time 07/14/2012 14:24  Final   Culture NO GROWTH 5 DAYS  Final   Report Status 07/20/2012 FINAL  Final  Blood culture (routine x 2)     Status: None   Collection Time: 07/14/12 11:23 AM  Result Value Ref Range Status   Specimen Description BLOOD RIGHT ANTECUBITAL  Final   Special Requests BOTTLES DRAWN AEROBIC AND ANAEROBIC 3CC  Final   Culture  Setup Time 07/14/2012 14:24  Final   Culture NO GROWTH 5 DAYS  Final   Report Status 07/20/2012 FINAL  Final    Studies/Results: No results found.  Assessment/Plan: 2 Days Post-Op Procedure(s) (LRB): LEFT OPEN PARTIAL NEPHRECTOMY (Left)   POD#2 open left partial nephrectomy. S/p intra-operative transfusion of 2u pRBC. Hemoglobin remains stable.   Regular diet - consider backing down to liquids  mIVF @ 75  Add ranitidine IV  Continue protonix  Ordered ompeprazole as non-formulary - however this may take 72 hours to obtain  Encourage PO narcotics  PRN IV morphine  Lidocaine patches for continued pain  Ambulate, IS  Labs tomorrow  Bisacodyl suppository   Scheduled librium  CIWA  SCDs at all times   LOS: 2 days   Acie Fredrickson 02/17/2015, 6:20 AM

## 2015-02-18 LAB — BASIC METABOLIC PANEL
Anion gap: 11 (ref 5–15)
BUN: 8 mg/dL (ref 6–20)
CO2: 24 mmol/L (ref 22–32)
Calcium: 9.1 mg/dL (ref 8.9–10.3)
Chloride: 100 mmol/L — ABNORMAL LOW (ref 101–111)
Creatinine, Ser: 1.05 mg/dL (ref 0.61–1.24)
GFR calc Af Amer: >60 mL/min (ref >60)
GFR calc non Af Amer: >60 mL/min (ref >60)
Glucose, Bld: 151 mg/dL — ABNORMAL HIGH (ref 65–99)
Potassium: 3.7 mmol/L (ref 3.5–5.1)
Sodium: 135 mmol/L (ref 135–145)

## 2015-02-18 LAB — HEMOGLOBIN AND HEMATOCRIT, BLOOD
HCT: 30.4 % — ABNORMAL LOW (ref 39.0–52.0)
Hemoglobin: 10.3 g/dL — ABNORMAL LOW (ref 13.0–17.0)

## 2015-02-18 NOTE — Progress Notes (Signed)
Urology Progress Note  3 Days Post-Op   Subjective: Pt c/o pain, poor BM, not eating.     No acute urologic events overnight. Ambulation:   positive Flatus:    positive this AM Bowel movement  positive minimal  Pain: continues  Objective:  Blood pressure 158/96, pulse 109, temperature 98.1 F (36.7 C), temperature source Axillary, resp. rate 20, height 6\' 2"  (1.88 m), weight 93.6 kg (206 lb 5.6 oz), SpO2 99 %.  Physical Exam:  General:  No acute distress, awake Extremities: extremities normal, atraumatic, no cyanosis or edema Genitourinary:  Normal blsdder. Incisional pain.  Foley:  out    I/O last 3 completed shifts: In: 4187.5 [P.O.:220; I.V.:3617.5; IV Piggyback:350] Out: [id number] [Urine:3875; Drains:130]  Recent Labs     02/16/15  0335  02/17/15  0435  02/18/15  0525  HGB  10.0*  9.7*  10.3*  WBC  6.9   --    --   PLT  138*   --    --     Recent Labs     02/17/15  0435  02/18/15  0525  NA  138  135  K  3.5  3.7  CL  103  100*  CO2  27  24  BUN  10  8  CREATININE  1.22  1.05  CALCIUM  8.9  9.1  GFRNONAA  >60  >60  GFRAA  >60  >60     No results for input(s): INR, APTT in the last 72 hours.  Invalid input(s): PT   Invalid input(s): ABG  Assessment/Plan:  Continue any current medications.  Pt will walk aggressively. Decrease pain med. Eat. Plan for D/c in AM.

## 2015-02-18 NOTE — Progress Notes (Signed)
MD Tannenbaum aware of pts BPs running 160s/100s. Rosine Beat, RN

## 2015-02-19 LAB — TYPE AND SCREEN
ABO/RH(D): A POS
Antibody Screen: NEGATIVE
Unit division: 0
Unit division: 0
Unit division: 0
Unit division: 0

## 2015-02-19 LAB — BASIC METABOLIC PANEL
Anion gap: 10 (ref 5–15)
BUN: 8 mg/dL (ref 6–20)
CO2: 25 mmol/L (ref 22–32)
Calcium: 8.6 mg/dL — ABNORMAL LOW (ref 8.9–10.3)
Chloride: 99 mmol/L — ABNORMAL LOW (ref 101–111)
Creatinine, Ser: 1.11 mg/dL (ref 0.61–1.24)
GFR calc Af Amer: >60 mL/min (ref >60)
GFR calc non Af Amer: >60 mL/min (ref >60)
Glucose, Bld: 112 mg/dL — ABNORMAL HIGH (ref 65–99)
Potassium: 3.2 mmol/L — ABNORMAL LOW (ref 3.5–5.1)
Sodium: 134 mmol/L — ABNORMAL LOW (ref 135–145)

## 2015-02-19 LAB — HEMOGLOBIN AND HEMATOCRIT, BLOOD
HCT: 29.7 % — ABNORMAL LOW (ref 39.0–52.0)
Hemoglobin: 9.9 g/dL — ABNORMAL LOW (ref 13.0–17.0)

## 2015-02-19 MED ORDER — DOCUSATE SODIUM 100 MG PO CAPS: 100.0000 mg | ORAL_CAPSULE | Freq: Two times a day (BID) | ORAL | Status: DC

## 2015-02-19 MED ORDER — OXYCODONE HCL 10 MG PO TABS: 10.0000 mg | ORAL_TABLET | ORAL | Status: DC | PRN

## 2015-02-19 NOTE — Discharge Summary (Signed)
Physician Discharge Summary  Patient ID: James [name] MRN: [id number] DOB/AGE: June 12, 1960 54 y.o.  Admit date: 02/15/2015 Discharge date: 02/19/2015  Admission Diagnoses: Left renal mass  Discharge Diagnoses:  Active Problems:   Renal mass   Discharged Condition: good  Hospital Course: He was admitted for an elective left nephrectomy which proceeded without complication. His hospital stay was unremarkable with a slight delay in return of bowel function and appetite but he developed an appetite and has been tolerating a regular diet. His bowels have moved. His Incision remains sore but His pain is controlled with pain medication. He has been ambulating but unfortunately twisted his right ankle yesterday. His drains have been removed and at this time his pathology remains pending. He is felt ready for discharge.    Discharge Exam: Blood pressure 143/93, pulse 112, temperature 99.9 F (37.7 C), temperature source Oral, resp. rate 18, height 6\' 2"  (1.88 m), weight 93.6 kg (206 lb 5.6 oz), SpO2 97 %. General appearance: alert, cooperative and no distress  His abdomen is soft, nontender and nondistended. His incision is healing with no evidence of erythema or discharge.  Disposition: 01-Home or Self Care  Discharge Instructions    Call MD for:  difficulty breathing, headache or visual disturbances    Complete by:  As directed      Call MD for:  extreme fatigue    Complete by:  As directed      Call MD for:  hives    Complete by:  As directed      Call MD for:  persistant dizziness or light-headedness    Complete by:  As directed      Call MD for:  persistant nausea and vomiting    Complete by:  As directed      Call MD for:  redness, tenderness, or signs of infection (pain, swelling, redness, odor or green/yellow discharge around incision site)    Complete by:  As directed      Call MD for:  severe uncontrolled pain    Complete by:  As directed      Call MD for:  temperature  >100.4    Complete by:  As directed      Diet general    Complete by:  As directed      Increase activity slowly    Complete by:  As directed             Medication List    STOP taking these medications        HYDROcodone-acetaminophen 5-325 MG tablet  Commonly known as:  NORCO/VICODIN     ibuprofen 200 MG tablet  Commonly known as:  ADVIL,MOTRIN     traMADol 50 MG tablet  Commonly known as:  ULTRAM      TAKE these medications        docusate sodium 100 MG capsule  Commonly known as:  COLACE  Take 1 capsule (100 mg total) by mouth 2 (two) times daily. Hold for diarrhea.     docusate sodium 100 MG capsule  Commonly known as:  COLACE  Take 1 capsule (100 mg total) by mouth 2 (two) times daily.     omeprazole 20 MG tablet  Commonly known as:  PRILOSEC OTC  Take 10 mg by mouth every other day.     oxyCODONE 5 MG immediate release tablet  Commonly known as:  ROXICODONE  Take 1-2 tablets (5-10 mg total) by mouth every 4 (four) hours as needed for moderate  pain.     Oxycodone HCl 10 MG Tabs  Take 1 tablet (10 mg total) by mouth every 4 (four) hours as needed.           Follow-up Information    Call Ardis Hughs, MD.   Specialty:  Urology   Why:  Call to confirm follow up appointment details.   Contact information:   [place]  [address] [phone]       Signed: Claybon Jabs 02/19/2015, 10:56 AM

## 2015-02-19 NOTE — Progress Notes (Signed)
Patient discharged home with wife, discharge instructions given and explained to patient and he verbalized understanding, denies any pain/distress, accompanied home by wife, Transported to the car by staff via wheelchair.Surgical incision clean/dry/intact, no sign of infection noted, staples inplace.

## 2015-02-26 HISTORY — PX: ABDOMINAL SURGERY: SHX537

## 2015-02-28 MED FILL — oxyCODONE HCL 5 MG TABS: 5 | 4 days supply | Qty: 50 | Fill #0

## 2015-03-15 MED FILL — oxyCODONE HCL 5 MG TABS: 5 | 4 days supply | Qty: 50 | Fill #0

## 2015-06-27 ENCOUNTER — Ambulatory Visit (HOSPITAL_COMMUNITY)
Admission: RE | Admit: 2015-06-27 | Discharge: 2015-06-27 | Disposition: A | Discharge status: Home / Self Care | Payer: Self-pay | Source: Ambulatory Visit | Attending: Urology | Admitting: Urology

## 2015-06-27 ENCOUNTER — Other Ambulatory Visit: Payer: Self-pay | Admitting: Urology

## 2015-06-27 DIAGNOSIS — C642 Malignant neoplasm of left kidney, except renal pelvis: Secondary | ICD-10-CM

## 2015-06-27 MED FILL — diazePAM 10 MG TABS: 10 | 3 days supply | Qty: 15 | Fill #0

## 2015-06-29 ENCOUNTER — Encounter: Payer: Self-pay | Admitting: Family Medicine

## 2015-06-29 MED FILL — oxyCODONE HCL 5 MG TABS: 5 | 3 days supply | Qty: 30 | Fill #0

## 2015-06-30 ENCOUNTER — Ambulatory Visit (HOSPITAL_COMMUNITY)
Admission: RE | Admit: 2015-06-30 | Discharge: 2015-06-30 | Disposition: A | Discharge status: Home / Self Care | Payer: Self-pay | Source: Ambulatory Visit | Attending: Urology | Admitting: Urology

## 2015-06-30 DIAGNOSIS — Z905 Acquired absence of kidney: Secondary | ICD-10-CM | POA: Insufficient documentation

## 2015-06-30 DIAGNOSIS — R93422 Abnormal radiologic findings on diagnostic imaging of left kidney: Secondary | ICD-10-CM | POA: Insufficient documentation

## 2015-06-30 DIAGNOSIS — C642 Malignant neoplasm of left kidney, except renal pelvis: Secondary | ICD-10-CM | POA: Insufficient documentation

## 2015-06-30 MED ORDER — GADOBENATE DIMEGLUMINE 529 MG/ML IV SOLN
20.0000 mL | Freq: Once | INTRAVENOUS | Status: AC | PRN
  Administered 2015-06-30: 18 mL via INTRAVENOUS

## 2015-07-10 MED FILL — oxyCODONE HCL 5 MG TABS: 5 | 3 days supply | Qty: 30 | Fill #0

## 2015-07-11 ENCOUNTER — Other Ambulatory Visit: Payer: Self-pay | Admitting: Urology

## 2015-07-13 NOTE — Patient Instructions (Addendum)
James [name]  07/13/2015   Your procedure is scheduled on: 07/19/2015    Report to [facility] Main  Entrance take [facility]  elevators to 3rd floor to  [place] at    1045 AM.  Call this number if you have problems the morning of surgery [phone]   Remember: ONLY 1 PERSON MAY GO WITH YOU TO SHORT STAY TO GET  READY MORNING OF [place].  Do not eat food or drink liquids :After Midnight.     Take these medicines the morning of surgery with A SIP OF WATER:  Omeprazole ( Prilosec)                                 You may not have any metal on your body including hair pins and              piercings  Do not wear jewelry, , lotions, powders or perfumes, deodorant                      Men may shave face and neck.   Do not bring valuables to the hospital. [place].  Contacts, dentures or bridgework may not be worn into surgery.      Patients discharged the day of surgery will not be allowed to drive home.  Name and phone number of your driver: unknown at this time.               Please read over the following fact sheets you were given: _____________________________________________________________________             [facility] - Preparing for Surgery Before surgery, you can play an important role.  Because skin is not sterile, your skin needs to be as free of germs as possible.  You can reduce the number of germs on your skin by washing with CHG (chlorahexidine gluconate) soap before surgery.  CHG is an antiseptic cleaner which kills germs and bonds with the skin to continue killing germs even after washing. Please DO NOT use if you have an allergy to CHG or antibacterial soaps.  If your skin becomes reddened/irritated stop using the CHG and inform your nurse when you arrive at Short Stay. Do not shave (including legs and underarms) for at least 48 hours prior to the first CHG shower.  You may  shave your face/neck. Please follow these instructions carefully:  1.  Shower with CHG Soap the night before surgery and the  morning of Surgery.  2.  If you choose to wash your hair, wash your hair first as usual with your  normal  shampoo.  3.  After you shampoo, rinse your hair and body thoroughly to remove the  shampoo.                           4.  Use CHG as you would any other liquid soap.  You can apply chg directly  to the skin and wash                       Gently with a scrungie or clean washcloth.  5.  Apply the  CHG Soap to your body ONLY FROM THE NECK DOWN.   Do not use on face/ open                           Wound or open sores. Avoid contact with eyes, ears mouth and genitals (private parts).                       Wash face,  Genitals (private parts) with your normal soap.             6.  Wash thoroughly, paying special attention to the area where your surgery  will be performed.  7.  Thoroughly rinse your body with warm water from the neck down.  8.  DO NOT shower/wash with your normal soap after using and rinsing off  the CHG Soap.                9.  Pat yourself dry with a clean towel.            10.  Wear clean pajamas.            11.  Place clean sheets on your bed the night of your first shower and do not  sleep with pets. Day of Surgery : Do not apply any lotions/deodorants the morning of surgery.  Please wear clean clothes to the hospital/surgery center.  FAILURE TO FOLLOW THESE INSTRUCTIONS MAY RESULT IN THE CANCELLATION OF YOUR SURGERY PATIENT SIGNATURE_________________________________  NURSE SIGNATURE__________________________________  ________________________________________________________________________

## 2015-07-14 ENCOUNTER — Encounter (HOSPITAL_COMMUNITY)
Admission: RE | Admit: 2015-07-14 | Discharge: 2015-07-14 | Disposition: A | Discharge status: Home / Self Care | Payer: Self-pay | Source: Ambulatory Visit | Attending: Urology | Admitting: Urology

## 2015-07-14 ENCOUNTER — Encounter (HOSPITAL_COMMUNITY): Payer: Self-pay

## 2015-07-14 DIAGNOSIS — Z01812 Encounter for preprocedural laboratory examination: Secondary | ICD-10-CM | POA: Insufficient documentation

## 2015-07-14 DIAGNOSIS — N1330 Unspecified hydronephrosis: Secondary | ICD-10-CM | POA: Insufficient documentation

## 2015-07-14 HISTORY — DX: Chronic kidney disease, unspecified: N18.9

## 2015-07-14 LAB — BASIC METABOLIC PANEL
Anion gap: 11 (ref 5–15)
BUN: 9 mg/dL (ref 6–20)
CO2: 21 mmol/L — ABNORMAL LOW (ref 22–32)
Calcium: 9.3 mg/dL (ref 8.9–10.3)
Chloride: 106 mmol/L (ref 101–111)
Creatinine, Ser: 1.02 mg/dL (ref 0.61–1.24)
GFR calc Af Amer: >60 mL/min (ref >60)
GFR calc non Af Amer: >60 mL/min (ref >60)
Glucose, Bld: 101 mg/dL — ABNORMAL HIGH (ref 65–99)
Potassium: 3.9 mmol/L (ref 3.5–5.1)
Sodium: 138 mmol/L (ref 135–145)

## 2015-07-14 LAB — CBC
HCT: 37 % — ABNORMAL LOW (ref 39.0–52.0)
Hemoglobin: 11.8 g/dL — ABNORMAL LOW (ref 13.0–17.0)
MCH: 26.6 pg (ref 26.0–34.0)
MCHC: 31.9 g/dL (ref 30.0–36.0)
MCV: 83.3 fL (ref 78.0–100.0)
Platelets: 179 10*3/uL (ref 150–400)
RBC: 4.44 MIL/uL (ref 4.22–5.81)
RDW: 17.6 % — ABNORMAL HIGH (ref 11.5–15.5)
WBC: 3.4 10*3/uL — ABNORMAL LOW (ref 4.0–10.5)

## 2015-07-14 NOTE — Pre-Procedure Instructions (Signed)
EKG 02-07-15 epic CXR 06-27-15 epic Left open partial nephrectomy 02-15-15

## 2015-07-19 ENCOUNTER — Encounter (HOSPITAL_COMMUNITY)
Admission: RE | Disposition: A | Discharge status: Home / Self Care | Payer: Self-pay | Source: Ambulatory Visit | Attending: Urology

## 2015-07-19 ENCOUNTER — Encounter (HOSPITAL_COMMUNITY): Payer: Self-pay | Admitting: *Deleted

## 2015-07-19 ENCOUNTER — Ambulatory Visit (HOSPITAL_COMMUNITY): Payer: Self-pay | Admitting: Certified Registered"

## 2015-07-19 ENCOUNTER — Ambulatory Visit (HOSPITAL_COMMUNITY)
Admission: RE | Admit: 2015-07-19 | Discharge: 2015-07-19 | Disposition: A | Discharge status: Home / Self Care | Payer: Self-pay | Source: Ambulatory Visit | Attending: Urology | Admitting: Urology

## 2015-07-19 DIAGNOSIS — K219 Gastro-esophageal reflux disease without esophagitis: Secondary | ICD-10-CM | POA: Insufficient documentation

## 2015-07-19 DIAGNOSIS — R109 Unspecified abdominal pain: Secondary | ICD-10-CM | POA: Insufficient documentation

## 2015-07-19 DIAGNOSIS — Z905 Acquired absence of kidney: Secondary | ICD-10-CM | POA: Insufficient documentation

## 2015-07-19 DIAGNOSIS — Z85528 Personal history of other malignant neoplasm of kidney: Secondary | ICD-10-CM | POA: Insufficient documentation

## 2015-07-19 DIAGNOSIS — N2889 Other specified disorders of kidney and ureter: Secondary | ICD-10-CM | POA: Insufficient documentation

## 2015-07-19 DIAGNOSIS — G4730 Sleep apnea, unspecified: Secondary | ICD-10-CM | POA: Insufficient documentation

## 2015-07-19 DIAGNOSIS — I10 Essential (primary) hypertension: Secondary | ICD-10-CM | POA: Insufficient documentation

## 2015-07-19 DIAGNOSIS — C642 Malignant neoplasm of left kidney, except renal pelvis: Secondary | ICD-10-CM

## 2015-07-19 DIAGNOSIS — Z85820 Personal history of malignant melanoma of skin: Secondary | ICD-10-CM | POA: Insufficient documentation

## 2015-07-19 DIAGNOSIS — Z79899 Other long term (current) drug therapy: Secondary | ICD-10-CM | POA: Insufficient documentation

## 2015-07-19 DIAGNOSIS — F17200 Nicotine dependence, unspecified, uncomplicated: Secondary | ICD-10-CM | POA: Insufficient documentation

## 2015-07-19 DIAGNOSIS — R9341 Abnormal radiologic findings on diagnostic imaging of renal pelvis, ureter, or bladder: Secondary | ICD-10-CM

## 2015-07-19 HISTORY — PX: CYSTOSCOPY WITH RETROGRADE PYELOGRAM, URETEROSCOPY AND STENT PLACEMENT: SHX5789

## 2015-07-19 SURGERY — CYSTOURETEROSCOPY, WITH RETROGRADE PYELOGRAM AND STENT INSERTION
Anesthesia: General | Laterality: Left

## 2015-07-19 MED ORDER — LIDOCAINE HCL (CARDIAC) 20 MG/ML IV SOLN
INTRAVENOUS | Status: DC | PRN
  Administered 2015-07-19: 60 mg via INTRAVENOUS

## 2015-07-19 MED ORDER — LIDOCAINE HCL 2 % EX GEL
CUTANEOUS | Status: AC
  Filled 2015-07-19: qty 5

## 2015-07-19 MED ORDER — MIDAZOLAM HCL 2 MG/2ML IJ SOLN
INTRAMUSCULAR | Status: DC | PRN
  Administered 2015-07-19 (×2): 1 mg via INTRAVENOUS

## 2015-07-19 MED ORDER — BELLADONNA ALKALOIDS-OPIUM 16.2-60 MG RE SUPP
RECTAL | Status: AC
  Filled 2015-07-19: qty 1

## 2015-07-19 MED ORDER — SODIUM CHLORIDE 0.9 % IJ SOLN
INTRAMUSCULAR | Status: AC
  Filled 2015-07-19: qty 20

## 2015-07-19 MED ORDER — DIATRIZOATE MEGLUMINE 30 % UR SOLN
URETHRAL | Status: AC
  Filled 2015-07-19: qty 100

## 2015-07-19 MED ORDER — SODIUM CHLORIDE 0.9 % IR SOLN
Status: DC | PRN
  Administered 2015-07-19: 3000 mL

## 2015-07-19 MED ORDER — CIPROFLOXACIN IN D5W 400 MG/200ML IV SOLN
400.0000 mg | INTRAVENOUS | Status: AC
  Administered 2015-07-19: 400 mg via INTRAVENOUS

## 2015-07-19 MED ORDER — KETOROLAC TROMETHAMINE 30 MG/ML IJ SOLN
30.0000 mg | Freq: Once | INTRAMUSCULAR | Status: AC
  Administered 2015-07-19: 30 mg via INTRAVENOUS
  Filled 2015-07-19: qty 1

## 2015-07-19 MED ORDER — PROPOFOL 10 MG/ML IV BOLUS
INTRAVENOUS | Status: AC
  Filled 2015-07-19: qty 20

## 2015-07-19 MED ORDER — LIDOCAINE HCL (CARDIAC) 20 MG/ML IV SOLN
INTRAVENOUS | Status: AC
  Filled 2015-07-19: qty 5

## 2015-07-19 MED ORDER — ONDANSETRON HCL 4 MG/2ML IJ SOLN
INTRAMUSCULAR | Status: DC | PRN
  Administered 2015-07-19: 4 mg via INTRAVENOUS

## 2015-07-19 MED ORDER — FENTANYL CITRATE (PF) 250 MCG/5ML IJ SOLN
INTRAMUSCULAR | Status: AC
  Filled 2015-07-19: qty 5

## 2015-07-19 MED ORDER — PROPOFOL 10 MG/ML IV BOLUS
INTRAVENOUS | Status: DC | PRN
  Administered 2015-07-19: 50 mg via INTRAVENOUS
  Administered 2015-07-19: 150 mg via INTRAVENOUS

## 2015-07-19 MED ORDER — LIDOCAINE HCL (CARDIAC) 20 MG/ML IV SOLN
INTRAVENOUS | Status: AC
  Filled 2015-07-19: qty 10

## 2015-07-19 MED ORDER — BELLADONNA ALKALOIDS-OPIUM 16.2-60 MG RE SUPP
RECTAL | Status: DC | PRN
  Administered 2015-07-19: 1 via RECTAL

## 2015-07-19 MED ORDER — OXYCODONE HCL 5 MG/5ML PO SOLN
5.0000 mg | Freq: Once | ORAL | Status: AC | PRN
  Filled 2015-07-19: qty 5

## 2015-07-19 MED ORDER — ONDANSETRON HCL 4 MG/2ML IJ SOLN
INTRAMUSCULAR | Status: AC
  Filled 2015-07-19: qty 2

## 2015-07-19 MED ORDER — ONDANSETRON HCL 4 MG/2ML IJ SOLN: 4.0000 mg | Freq: Once | INTRAMUSCULAR | Status: DC | PRN

## 2015-07-19 MED ORDER — DIATRIZOATE MEGLUMINE 30 % UR SOLN
URETHRAL | Status: DC | PRN
  Administered 2015-07-19: 100 mL

## 2015-07-19 MED ORDER — CIPROFLOXACIN IN D5W 400 MG/200ML IV SOLN
INTRAVENOUS | Status: AC
  Filled 2015-07-19: qty 200

## 2015-07-19 MED ORDER — ROCURONIUM BROMIDE 100 MG/10ML IV SOLN
INTRAVENOUS | Status: AC
  Filled 2015-07-19: qty 1

## 2015-07-19 MED ORDER — PHENYLEPHRINE 40 MCG/ML (10ML) SYRINGE FOR IV PUSH (FOR BLOOD PRESSURE SUPPORT)
PREFILLED_SYRINGE | INTRAVENOUS | Status: AC
  Filled 2015-07-19: qty 20

## 2015-07-19 MED ORDER — OXYCODONE HCL 5 MG PO TABS: 5.0000 mg | ORAL_TABLET | ORAL | Status: DC | PRN

## 2015-07-19 MED ORDER — LACTATED RINGERS IV SOLN: INTRAVENOUS | Status: DC

## 2015-07-19 MED ORDER — OXYCODONE HCL 5 MG PO TABS
5.0000 mg | ORAL_TABLET | Freq: Once | ORAL | Status: AC | PRN
  Administered 2015-07-19: 5 mg via ORAL
  Filled 2015-07-19: qty 1

## 2015-07-19 MED ORDER — LIDOCAINE HCL 2 % EX GEL
CUTANEOUS | Status: DC | PRN
  Administered 2015-07-19: 1

## 2015-07-19 MED ORDER — FENTANYL CITRATE (PF) 100 MCG/2ML IJ SOLN: 25.0000 ug | INTRAMUSCULAR | Status: DC | PRN

## 2015-07-19 MED ORDER — MIDAZOLAM HCL 2 MG/2ML IJ SOLN
INTRAMUSCULAR | Status: AC
  Filled 2015-07-19: qty 2

## 2015-07-19 MED ORDER — FENTANYL CITRATE (PF) 250 MCG/5ML IJ SOLN
INTRAMUSCULAR | Status: DC | PRN
  Administered 2015-07-19 (×4): 50 ug via INTRAVENOUS

## 2015-07-19 MED ORDER — HYDROMORPHONE HCL 1 MG/ML IJ SOLN
0.5000 mg | INTRAMUSCULAR | Status: AC
  Administered 2015-07-19: 0.5 mg via INTRAVENOUS
  Filled 2015-07-19: qty 1

## 2015-07-19 MED ORDER — LACTATED RINGERS IV SOLN
INTRAVENOUS | Status: DC | PRN
  Administered 2015-07-19 (×2): via INTRAVENOUS

## 2015-07-19 MED FILL — oxyCODONE HCL 5 MG TABS: 5 | 3 days supply | Qty: 30 | Fill #0

## 2015-07-19 SURGICAL SUPPLY — 17 items
BAG URO CATCHER STRL LF (MISCELLANEOUS) ×2 IMPLANT
BASKET DAKOTA 1.9FR 11X120 (BASKET) IMPLANT
BASKET ZERO TIP NITINOL 2.4FR (BASKET) IMPLANT
CATH URET 5FR 28IN OPEN ENDED (CATHETERS) ×2 IMPLANT
CLOTH BEACON ORANGE TIMEOUT ST (SAFETY) ×2 IMPLANT
GLOVE BIOGEL M STRL SZ7.5 (GLOVE) ×2 IMPLANT
GOWN STRL REUS W/TWL XL LVL3 (GOWN DISPOSABLE) ×2 IMPLANT
GUIDEWIRE ANG ZIPWIRE 038X150 (WIRE) IMPLANT
GUIDEWIRE STR DUAL SENSOR (WIRE) ×2 IMPLANT
MANIFOLD NEPTUNE II (INSTRUMENTS) ×2 IMPLANT
PACK CYSTO (CUSTOM PROCEDURE TRAY) ×2 IMPLANT
SHEATH ACCESS URETERAL 24CM (SHEATH) IMPLANT
SHEATH ACCESS URETERAL 38CM (SHEATH) IMPLANT
SHEATH ACCESS URETERAL 54CM (SHEATH) IMPLANT
STENT CONTOUR 6FRX28X.038 (STENTS) ×2 IMPLANT
TUBING CONNECTING 10 (TUBING) ×2 IMPLANT
WIRE COONS/BENSON .038X145CM (WIRE) ×2 IMPLANT

## 2015-07-19 NOTE — Transfer of Care (Signed)
Immediate Anesthesia Transfer of Care Note  Patient: James [name]  Procedure(s) Performed: Procedure(s): LEFT RETROGRADE PYELOGRAM, LEFT URETEROSCOPY AND LEFT URETERAL STENT PLACEMENT (Left)  Patient Location: PACU  Anesthesia Type:General  Level of Consciousness: awake and alert   Airway & Oxygen Therapy: Patient Spontanous Breathing  Post-op Assessment: Report given to RN and Post -op Vital signs reviewed and stable  Post vital signs: Reviewed and stable  Last Vitals:  Filed Vitals:   07/19/15 1052  BP: 139/86  Pulse: 92  Temp: 37.2 C  Resp: 16    Last Pain:  Filed Vitals:   07/19/15 1130  PainSc: 7       Patients Stated Pain Goal: 2 ([id number])  Complications: No apparent anesthesia complications

## 2015-07-19 NOTE — Anesthesia Postprocedure Evaluation (Signed)
Anesthesia Post Note  Patient: ARIC ATER  Procedure(s) Performed: Procedure(s) (LRB): LEFT RETROGRADE PYELOGRAM, LEFT URETEROSCOPY AND LEFT URETERAL STENT PLACEMENT (Left)  Patient location during evaluation: PACU Anesthesia Type: General Level of consciousness: awake and alert Pain management: pain level controlled Vital Signs Assessment: post-procedure vital signs reviewed and stable Respiratory status: spontaneous breathing, nonlabored ventilation, respiratory function stable and patient connected to nasal cannula oxygen Cardiovascular status: blood pressure returned to baseline and stable Postop Assessment: no signs of nausea or vomiting Anesthetic complications: no    Last Vitals:  Filed Vitals:   07/19/15 1415 07/19/15 1427  BP: 150/97 146/97  Pulse: 78 72  Temp: 37.1 C 36.9 C  Resp: 19 12    Last Pain:  Filed Vitals:   07/19/15 1547  PainSc: 8                  Zenaida Deed

## 2015-07-19 NOTE — Anesthesia Preprocedure Evaluation (Addendum)
Anesthesia Evaluation  Patient identified by MRN, date of birth, ID band Patient awake    Reviewed: Allergy & Precautions, H&P , NPO status , Patient's Chart, lab work & pertinent test results  History of Anesthesia Complications Negative for: history of anesthetic complications  Airway Mallampati: III  TM Distance: >3 FB Neck ROM: full    Dental  (+) Poor Dentition, Dental Advisory Given Nicotine stained, non loose:   Pulmonary Current Smoker, former smoker,    Pulmonary exam normal breath sounds clear to auscultation       Cardiovascular hypertension, Normal cardiovascular exam Rhythm:regular Rate:Normal     Neuro/Psych PSYCHIATRIC DISORDERS negative neurological ROS     GI/Hepatic GERD  ,(+)     substance abuse  alcohol use,   Endo/Other  negative endocrine ROS  Renal/GU Renal disease     Musculoskeletal   Abdominal   Peds  Hematology negative hematology ROS (+)   Anesthesia Other Findings   Reproductive/Obstetrics negative OB ROS                            Anesthesia Physical  Anesthesia Plan  ASA: III  Anesthesia Plan: General   Post-op Pain Management:    Induction: Intravenous  Airway Management Planned: LMA  Additional Equipment:   Intra-op Plan:   Post-operative Plan: Extubation in OR  Informed Consent: I have reviewed the patients History and Physical, chart, labs and discussed the procedure including the risks, benefits and alternatives for the proposed anesthesia with the patient or authorized representative who has indicated his/her understanding and acceptance.   Dental Advisory Given  Plan Discussed with: Anesthesiologist and CRNA  Anesthesia Plan Comments:         Anesthesia Quick Evaluation

## 2015-07-19 NOTE — H&P (Signed)
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 Negative Margins.  Hospital course largely unremarkable.  Intv: The patient underwent an MRI since he was last seen to better delineate any obvious explanation for her symptoms. His MRI demonstrated a fluid collection in the area of the resection of his left renal mass. There is question as to whether this communicated with the collecting system and was a communicating urinoma. The patient's symptoms have progressed somewhat. The patient's symptoms are worse at night and seemed to affect his sleep. They do improve over the course of the day. He has been taking pain medication to health issues.     The patient has not had any labs drawn or any imaging since his surgery.  He no showed his appointment in April.   Past Medical History Problems  1. History of heartburn (Z87.898) 2. History of malignant melanoma (Z85.820) 3. History of sleep apnea (Z86.69)  Surgical History Problems  1. History of Destruction Of Malignant Lesion 2. History of Partial Nephrectomy  Current Meds 1. Hydrocodone-Acetaminophen 5-325 MG Oral Tablet; Take 1-2 tablets every 4-6 hours for  pain;  Therapy: [phone] to (Last Rx:08Dec2016) Ordered 2. Omeprazole 20 MG Oral Capsule Delayed Release;  Therapy: (Recorded:23Jun2015) to Recorded 3. OxyCODONE HCl - 5 MG Oral Tablet; TAKE 1 TO 2 TABLETS EVERY 4 HOURS AS  NEEDED FOR PAIN;  Therapy: [id number] to (Evaluate:15May2017); Last Rx:11May2017 Ordered 4. TraMADol HCl - 50 MG Oral Tablet; 1 po q 6 prn;  Therapy: [id number] to (Last Rx:18Nov2016) Ordered  Allergies Medication  1. Sulfa Drugs  Family History Problems  1. Family history of COPD (chronic obstructive pulmonary disease) with emphysema :  Father 2. Family history of hypertension (Z82.49) : Brother,  Sister 3. Family history of lung cancer (Z80.1) : Mother  Social History Problems  1. Alcohol use (Z78.9)   socially 2. Caffeine use (F15.90)   1 3. Current every day smoker (F17.200) 4. Father deceased   45yrs 5. Married 6. Mother's age   75yrs 7. Occupation   self employed 8. Three children  Vitals Vital Signs [Data Includes: Last 1 Day]  Recorded: [id number] 04:12PM  Blood Pressure: 148 / 85 Temperature: 98.4 F Heart Rate: 98  Physical Exam The patient has severe left sided tenderness to palpation - the pain is superficial, no rashes, no bulge along the incision, CVA tenderness     Results/Data Urine [Data Includes: Last 1 Day]   [id number]  COLOR YELLOW   APPEARANCE CLEAR   SPECIFIC GRAVITY <1.005   pH 6.0   GLUCOSE NEGATIVE   BILIRUBIN NEGATIVE   KETONE NEGATIVE   BLOOD NEGATIVE   PROTEIN NEGATIVE   NITRITE NEGATIVE   LEUKOCYTE ESTERASE NEGATIVE    UA - normal   Assessment Assessed  1. Renal cell carcinoma of left kidney (C64.2)  T2b renal cell carcinoma, clear cell type, negative margins.   The patient's pain may well be from a urinoma although it is difficult to truly ascertain why he is having so much pain.   Plan Health Maintenance  1. UA With REFLEX; [Do Not Release]; Status:Resulted - Requires Verification;   DoneGX:[id number] 04:01PM  Discussion/Summary Plan at this point is to proceed to the operating room for a diagnostic retrograde ureterogram and ureteroscopy. If indeed the area is communicating then we would place a  stent and even consider fulgurating the neck of the lesion to try and obliterate it. I discussed this operation patient in detail including the risks and the benefits. We'll try to get this scheduled for the near future.

## 2015-07-19 NOTE — Anesthesia Procedure Notes (Signed)
Procedure Name: LMA Insertion Date/Time: 07/19/2015 12:30 PM Performed by: Cynda Familia Pre-anesthesia Checklist: Patient identified, Emergency Drugs available, Suction available and Patient being monitored Patient Re-evaluated:Patient Re-evaluated prior to inductionOxygen Delivery Method: Circle System Utilized Preoxygenation: Pre-oxygenation with 100% oxygen Intubation Type: IV induction Ventilation: Mask ventilation without difficulty LMA: LMA inserted LMA Size: 4.0 Tube type: Oral (20 cc air) Number of attempts: 1 Placement Confirmation: positive ETCO2 Tube secured with: Tape Dental Injury: Teeth and Oropharynx as per pre-operative assessment  Comments: Smooth IV induction--- Jillyn Hidden present- LMA - atraumatic insertion- bilat BS Judd-- teeth and mouth as preop

## 2015-07-19 NOTE — Progress Notes (Signed)
Patient back from PACU and pain 8/10. Oxy IR 5mg  given. No relief in 45 minutes. MD called and pain meds ordered. Patient has been up to restroom with RN x2 voided well. Will administer Dilaudid and Toradol per order.

## 2015-07-19 NOTE — Discharge Instructions (Signed)
DISCHARGE INSTRUCTIONS FOR KIDNEY STONE/URETERAL STENT   MEDICATIONS:  1.  Resume all your other meds from home - except do not take any extra narcotic pain meds that you may have at home.    ACTIVITY:  1. No strenuous activity x 1week  2. No driving while on narcotic pain medications  3. Drink plenty of water  4. Continue to walk at home - you can still get blood clots when you are at home, so keep active, but don't over do it.  5. May return to work/school tomorrow or when you feel ready   BATHING:  1. You can shower and we recommend daily showers   SIGNS/SYMPTOMS TO CALL:  Please call us if you have a fever greater than 101.5, uncontrolled nausea/vomiting, uncontrolled pain, dizziness, unable to urinate, bloody urine, chest pain, shortness of breath, leg swelling, leg pain, redness around wound, drainage from wound, or any other concerns or questions.   You can reach [place] at [phone].   FOLLOW-UP:  1. You have an appointment in 2 weeks with Dr. Louis Meckel for follow-up of recent surgery.

## 2015-07-19 NOTE — Op Note (Signed)
Preoperative diagnosis:  1. Right upper pole diverticulum/urinary fistula   Postoperative diagnosis:  1. Same   Procedure: 1. Cystoscopy, left retrograde pyelogram with interpretation 2. Left ureteroscopy, diagnostic 3. Left ureteral stent placement  Surgeon: Ardis Hughs, MD  Anesthesia: General  Complications: None  Intraoperative findings: The patient's retrograde pyelogram was performed using proximal A 25 mL of Cystografin. The ureter was normal with a normal collecting system save the upper pole. There was a wisp of contrast and then pooling in the upper pole laterally consistent with the patient's diverticulum. There is no hydronephrosis. The remainder of the collecting system was normal in appearance. Perform ureteroscopy and noted no significant abnormality in the upper pole and no clear connection to the fistulous or diverticular collection. I was unable to find this despite probing the area under fluoroscopy with the wire. I did leave a stent, 28 cm times 6 French double-J.  EBL: Minimal  Specimens: None  Indication: James [name] is a 55 y.o. patient with left-sided flank pain. He underwent an open partial nephrectomy approximately 5 months ago. He had recovered well until proximally one month ago when he developed severe onset left-sided flank pain. The pain was quite severe and limiting his activity. An MRI was performed which demonstrated a collecting system diverticulum/fistula. With these findings we opted to proceed to the operating room for diagnostic ureteroscopy and stent placement.  After reviewing the management options for treatment, he elected to proceed with the above surgical procedure(s). We have discussed the potential benefits and risks of the procedure, side effects of the proposed treatment, the likelihood of the patient achieving the goals of the procedure, and any potential problems that might occur during the procedure or recuperation. Informed  consent has been obtained.  Description of procedure:  The patient was taken to the operating room and general anesthesia was induced.  The patient was placed in the dorsal lithotomy position, prepped and draped in the usual sterile fashion, and preoperative antibiotics were administered. A preoperative time-out was performed.   I then placed a 30 21 French cystoscope to the patient's urethra and into the bladder. The bladder was then emptied and slowly refilled all simultaneously performing a 360 cystoscopic evaluation. The bladder was noted to be normal. The ureters were orthotopic. There were no mucosal abnormalities. I then performed of left-sided retrograde pyelogram, with the above findings. I then advanced a 0.38 sensor wire through the open-ended ureteral catheter and into the left renal pelvis. I backed the scope out over the wire. I then advanced a flexible ureteroscope through the urethra and into the left distal ureter. This was done with the assistance of a second wire through the ureteroscope. Once into the proximal ureter was able to remove the scope and performed pyeloscopy. I noted in the upper pole there was some scar tissue but no clear fistula or narrow infundibulum. The remainder of the pyeloscopy was normal. I then attempted again using a wire to poke through into the collection unsuccessfully. I then injected more Cystografin through the scope attempting to identify narrow infundibulum into the diverticulum unsuccessfully. This point and just backed out the ureteroscope and then backloaded the cystoscope over the safety wire before advancing a 6 [place] times 28 cm double-J stent over the wire and into the right collecting system. I was able to place the stent in the upper pole adjacent to the fistula or diverticulum. The stent tether was left off of the string. A B and O suppository was  then placed in the patient's rectum. Urethral lidocaine jelly was also inserted.  The patient was  subsequently extubated and returned to PACU in stable condition.  Ardis Hughs, M.D.

## 2015-08-03 MED FILL — oxyCODONE HCL 5 MG TABS: 5 | 5 days supply | Qty: 30 | Fill #0

## 2015-09-06 MED FILL — oxyCODONE HCL 5 MG TABS: 5 | 5 days supply | Qty: 30 | Fill #0

## 2015-11-23 ENCOUNTER — Emergency Department (HOSPITAL_COMMUNITY): Payer: Self-pay

## 2015-11-23 ENCOUNTER — Encounter (HOSPITAL_COMMUNITY): Payer: Self-pay | Admitting: Emergency Medicine

## 2015-11-23 ENCOUNTER — Inpatient Hospital Stay (HOSPITAL_COMMUNITY)
Admission: EM | Admit: 2015-11-23 | Discharge: 2015-11-30 | DRG: 439 | Disposition: A | Discharge status: Home / Self Care | Payer: Self-pay | Attending: Internal Medicine | Admitting: Internal Medicine

## 2015-11-23 DIAGNOSIS — Z905 Acquired absence of kidney: Secondary | ICD-10-CM

## 2015-11-23 DIAGNOSIS — K219 Gastro-esophageal reflux disease without esophagitis: Secondary | ICD-10-CM | POA: Diagnosis present

## 2015-11-23 DIAGNOSIS — Z79899 Other long term (current) drug therapy: Secondary | ICD-10-CM

## 2015-11-23 DIAGNOSIS — Z85820 Personal history of malignant melanoma of skin: Secondary | ICD-10-CM

## 2015-11-23 DIAGNOSIS — N189 Chronic kidney disease, unspecified: Secondary | ICD-10-CM | POA: Diagnosis present

## 2015-11-23 DIAGNOSIS — I82812 Embolism and thrombosis of superficial veins of left lower extremities: Secondary | ICD-10-CM

## 2015-11-23 DIAGNOSIS — K56609 Unspecified intestinal obstruction, unspecified as to partial versus complete obstruction: Secondary | ICD-10-CM

## 2015-11-23 DIAGNOSIS — K852 Alcohol induced acute pancreatitis without necrosis or infection: Principal | ICD-10-CM | POA: Diagnosis present

## 2015-11-23 DIAGNOSIS — Z87891 Personal history of nicotine dependence: Secondary | ICD-10-CM

## 2015-11-23 DIAGNOSIS — R748 Abnormal levels of other serum enzymes: Secondary | ICD-10-CM | POA: Diagnosis present

## 2015-11-23 DIAGNOSIS — C649 Malignant neoplasm of unspecified kidney, except renal pelvis: Secondary | ICD-10-CM | POA: Diagnosis present

## 2015-11-23 DIAGNOSIS — I129 Hypertensive chronic kidney disease with stage 1 through stage 4 chronic kidney disease, or unspecified chronic kidney disease: Secondary | ICD-10-CM | POA: Diagnosis present

## 2015-11-23 DIAGNOSIS — F1020 Alcohol dependence, uncomplicated: Secondary | ICD-10-CM | POA: Diagnosis present

## 2015-11-23 DIAGNOSIS — R7401 Elevation of levels of liver transaminase levels: Secondary | ICD-10-CM | POA: Diagnosis present

## 2015-11-23 DIAGNOSIS — K2980 Duodenitis without bleeding: Secondary | ICD-10-CM | POA: Diagnosis present

## 2015-11-23 DIAGNOSIS — Z85528 Personal history of other malignant neoplasm of kidney: Secondary | ICD-10-CM

## 2015-11-23 DIAGNOSIS — C642 Malignant neoplasm of left kidney, except renal pelvis: Secondary | ICD-10-CM

## 2015-11-23 DIAGNOSIS — Z801 Family history of malignant neoplasm of trachea, bronchus and lung: Secondary | ICD-10-CM

## 2015-11-23 DIAGNOSIS — F10288 Alcohol dependence with other alcohol-induced disorder: Secondary | ICD-10-CM | POA: Diagnosis present

## 2015-11-23 DIAGNOSIS — K859 Acute pancreatitis without necrosis or infection, unspecified: Secondary | ICD-10-CM | POA: Diagnosis present

## 2015-11-23 HISTORY — DX: Malignant neoplasm of unspecified kidney, except renal pelvis: C64.9

## 2015-11-23 LAB — CBC WITH DIFFERENTIAL/PLATELET
Basophils Absolute: 0 10*3/uL (ref 0.0–0.1)
Basophils Relative: 0 %
Eosinophils Absolute: 0.1 10*3/uL (ref 0.0–0.7)
Eosinophils Relative: 1 %
HCT: 37.8 % — ABNORMAL LOW (ref 39.0–52.0)
Hemoglobin: 11.8 g/dL — ABNORMAL LOW (ref 13.0–17.0)
Lymphocytes Relative: 14 %
Lymphs Abs: 0.9 10*3/uL (ref 0.7–4.0)
MCH: 25.1 pg — ABNORMAL LOW (ref 26.0–34.0)
MCHC: 31.2 g/dL (ref 30.0–36.0)
MCV: 80.4 fL (ref 78.0–100.0)
Monocytes Absolute: 0.6 10*3/uL (ref 0.1–1.0)
Monocytes Relative: 9 %
Neutro Abs: 5 10*3/uL (ref 1.7–7.7)
Neutrophils Relative %: 76 %
Platelets: 203 10*3/uL (ref 150–400)
RBC: 4.7 MIL/uL (ref 4.22–5.81)
RDW: 16.8 % — ABNORMAL HIGH (ref 11.5–15.5)
WBC: 6.6 10*3/uL (ref 4.0–10.5)

## 2015-11-23 LAB — URINALYSIS, ROUTINE W REFLEX MICROSCOPIC
Bilirubin Urine: NEGATIVE
Glucose, UA: NEGATIVE mg/dL
Hgb urine dipstick: NEGATIVE
Ketones, ur: NEGATIVE mg/dL
Leukocytes, UA: NEGATIVE
Nitrite: NEGATIVE
Protein, ur: NEGATIVE mg/dL
Specific Gravity, Urine: 1.019 (ref 1.005–1.030)
pH: 6 (ref 5.0–8.0)

## 2015-11-23 LAB — COMPREHENSIVE METABOLIC PANEL
ALT: 48 U/L (ref 17–63)
AST: 88 U/L — ABNORMAL HIGH (ref 15–41)
Albumin: 4.1 g/dL (ref 3.5–5.0)
Alkaline Phosphatase: 63 U/L (ref 38–126)
Anion gap: 10 (ref 5–15)
BUN: 8 mg/dL (ref 6–20)
CO2: 20 mmol/L — ABNORMAL LOW (ref 22–32)
Calcium: 9 mg/dL (ref 8.9–10.3)
Chloride: 109 mmol/L (ref 101–111)
Creatinine, Ser: 0.87 mg/dL (ref 0.61–1.24)
GFR calc Af Amer: >60 mL/min (ref >60)
GFR calc non Af Amer: >60 mL/min (ref >60)
Glucose, Bld: 159 mg/dL — ABNORMAL HIGH (ref 65–99)
Potassium: 3.6 mmol/L (ref 3.5–5.1)
Sodium: 139 mmol/L (ref 135–145)
Total Bilirubin: 1.2 mg/dL (ref 0.3–1.2)
Total Protein: 8.8 g/dL — ABNORMAL HIGH (ref 6.5–8.1)

## 2015-11-23 LAB — LIPASE, BLOOD: Lipase: 4071 U/L — ABNORMAL HIGH (ref 11–51)

## 2015-11-23 MED ORDER — THIAMINE HCL 100 MG/ML IJ SOLN: 100.0000 mg | Freq: Every day | INTRAMUSCULAR | Status: DC

## 2015-11-23 MED ORDER — ONDANSETRON HCL 4 MG/2ML IJ SOLN
4.0000 mg | Freq: Once | INTRAMUSCULAR | Status: AC
  Administered 2015-11-23: 4 mg via INTRAVENOUS
  Filled 2015-11-23: qty 2

## 2015-11-23 MED ORDER — IOPAMIDOL (ISOVUE-300) INJECTION 61%
100.0000 mL | Freq: Once | INTRAVENOUS | Status: AC | PRN
  Administered 2015-11-23: 100 mL via INTRAVENOUS

## 2015-11-23 MED ORDER — PANTOPRAZOLE SODIUM 40 MG PO TBEC
40.0000 mg | DELAYED_RELEASE_TABLET | Freq: Two times a day (BID) | ORAL | Status: DC
  Administered 2015-11-23 – 2015-11-30 (×14): 40 mg via ORAL
  Filled 2015-11-23 (×14): qty 1

## 2015-11-23 MED ORDER — MORPHINE SULFATE (PF) 4 MG/ML IV SOLN
4.0000 mg | Freq: Once | INTRAVENOUS | Status: AC
Start: 2015-11-23 — End: 2015-11-23
  Administered 2015-11-23: 4 mg via INTRAVENOUS
  Filled 2015-11-23: qty 1

## 2015-11-23 MED ORDER — HYDROMORPHONE HCL 1 MG/ML IJ SOLN
INTRAMUSCULAR | Status: AC
  Filled 2015-11-23: qty 1

## 2015-11-23 MED ORDER — ONDANSETRON HCL 4 MG/2ML IJ SOLN
4.0000 mg | Freq: Four times a day (QID) | INTRAMUSCULAR | Status: DC | PRN
  Administered 2015-11-25 – 2015-11-27 (×2): 4 mg via INTRAVENOUS
  Filled 2015-11-23 (×2): qty 2

## 2015-11-23 MED ORDER — LORAZEPAM 1 MG PO TABS
1.0000 mg | ORAL_TABLET | Freq: Four times a day (QID) | ORAL | Status: AC | PRN
  Administered 2015-11-25 (×2): 1 mg via ORAL
  Filled 2015-11-23 (×2): qty 1

## 2015-11-23 MED ORDER — LORAZEPAM 2 MG/ML IJ SOLN: 1.0000 mg | Freq: Four times a day (QID) | INTRAMUSCULAR | Status: AC | PRN

## 2015-11-23 MED ORDER — SODIUM CHLORIDE 0.9 % IV BOLUS (SEPSIS)
1000.0000 mL | Freq: Once | INTRAVENOUS | Status: AC
  Administered 2015-11-23: 1000 mL via INTRAVENOUS

## 2015-11-23 MED ORDER — MORPHINE SULFATE (PF) 2 MG/ML IV SOLN
2.0000 mg | INTRAVENOUS | Status: DC | PRN
  Administered 2015-11-23 – 2015-11-29 (×40): 2 mg via INTRAVENOUS
  Filled 2015-11-23 (×42): qty 1

## 2015-11-23 MED ORDER — ENOXAPARIN SODIUM 40 MG/0.4ML ~~LOC~~ SOLN
40.0000 mg | SUBCUTANEOUS | Status: DC
  Administered 2015-11-23 – 2015-11-29 (×7): 40 mg via SUBCUTANEOUS
  Filled 2015-11-23 (×7): qty 0.4

## 2015-11-23 MED ORDER — SODIUM CHLORIDE 0.9 % IV SOLN
INTRAVENOUS | Status: AC
  Administered 2015-11-23 – 2015-11-24 (×4): via INTRAVENOUS

## 2015-11-23 MED ORDER — FOLIC ACID 1 MG PO TABS
1.0000 mg | ORAL_TABLET | Freq: Every day | ORAL | Status: DC
  Administered 2015-11-23 – 2015-11-30 (×8): 1 mg via ORAL
  Filled 2015-11-23 (×8): qty 1

## 2015-11-23 MED ORDER — SODIUM CHLORIDE 0.9 % IV SOLN: INTRAVENOUS | Status: DC

## 2015-11-23 MED ORDER — MORPHINE SULFATE (PF) 4 MG/ML IV SOLN
4.0000 mg | Freq: Once | INTRAVENOUS | Status: AC
  Administered 2015-11-23: 4 mg via INTRAVENOUS
  Filled 2015-11-23: qty 1

## 2015-11-23 MED ORDER — HYDROMORPHONE HCL 1 MG/ML IJ SOLN
1.0000 mg | INTRAMUSCULAR | Status: DC | PRN
  Administered 2015-11-23: 1 mg via INTRAVENOUS
  Filled 2015-11-23: qty 1

## 2015-11-23 MED ORDER — ADULT MULTIVITAMIN W/MINERALS CH
1.0000 | ORAL_TABLET | Freq: Every day | ORAL | Status: DC
  Administered 2015-11-23 – 2015-11-30 (×8): 1 via ORAL
  Filled 2015-11-23 (×13): qty 1

## 2015-11-23 MED ORDER — ONDANSETRON HCL 4 MG PO TABS: 4.0000 mg | ORAL_TABLET | Freq: Four times a day (QID) | ORAL | Status: DC | PRN

## 2015-11-23 MED ORDER — VITAMIN B-1 100 MG PO TABS
100.0000 mg | ORAL_TABLET | Freq: Every day | ORAL | Status: DC
  Administered 2015-11-23 – 2015-11-30 (×8): 100 mg via ORAL
  Filled 2015-11-23 (×8): qty 1

## 2015-11-23 MED ORDER — HYDROMORPHONE HCL 1 MG/ML IJ SOLN
1.0000 mg | Freq: Once | INTRAMUSCULAR | Status: AC
  Administered 2015-11-23: 1 mg via INTRAVENOUS

## 2015-11-23 NOTE — ED Triage Notes (Signed)
Patient has pain all over in the abdomen, radiating to the back and flank.

## 2015-11-23 NOTE — ED Provider Notes (Signed)
[facility] DEPT Provider Note   CSN: [id number] Arrival date & time: 11/23/15  0753     History   Chief Complaint Chief Complaint  Patient presents with  . Abdominal Pain    HPI James [name] is a 55 y.o. male hx of CKD, GERD, HTN, L renal mass s/p resection Here presenting with abdominal pain. Patient had a left renal mass as was resected in May of this year. Patient states that over the last month or so he has progressive swelling and pain in the left side of his abdomen where the incision was. This morning, he woke up with severe left-sided abdominal pain associated with one episode of vomiting. States that his abdomen is more distended than usual but he is still passing gas. Also has normal bowel movement recently. No history of small bowel obstruction.    The history is provided by the patient.    Past Medical History:  Diagnosis Date  . Arthritis    right knee right wrist   . Chronic kidney disease   . GERD (gastroesophageal reflux disease)   . Hypertension    pt states was treated with medication back in 2000; currently on no medication   . Left renal mass   . Melanoma ([place]) dx'd 23yrs ago age 70   rt lat chest wall--surg only  . Renal cancer ([place]) dx'd 01/2015   left nephrectomy  . Wears glasses     Patient Active Problem List   Diagnosis Date Noted  . Alcohol induced acute pancreatitis 11/23/2015  . Elevated lipase 11/23/2015  . Renal mass 02/15/2015  . Renal cell carcinoma ([place]) 02/15/2015  . Cellulitis and abscess of leg, left 07/14/2012  . GERD (gastroesophageal reflux disease) 07/14/2012  . Alcohol dependence ([place]) 05/25/2012    Past Surgical History:  Procedure Laterality Date  . ABDOMINAL SURGERY     remove tumor cyst   . CYSTOSCOPY WITH RETROGRADE PYELOGRAM, URETEROSCOPY AND STENT PLACEMENT Left 07/19/2015   Procedure: LEFT RETROGRADE PYELOGRAM, LEFT URETEROSCOPY AND LEFT URETERAL STENT PLACEMENT;  Surgeon: Ardis Hughs, MD;  Location:  [facility];  Service: Urology;  Laterality: Left;  . drained cyst      left kidney 10 months ago   . lymph nodes removed      right armpit secondary to melanoma   . NEPHRECTOMY Left 02/15/2015   Procedure: LEFT OPEN PARTIAL NEPHRECTOMY;  Surgeon: Ardis Hughs, MD;  Location: [facility];  Service: Urology;  Laterality: Left;  . right armpit surgery     for melanoma       Home Medications    Prior to Admission medications   Medication Sig Start Date End Date Taking? Authorizing Provider  ibuprofen (ADVIL,MOTRIN) 200 MG tablet Take 800 mg by mouth every 6 (six) hours as needed for moderate pain.   Yes Historical Provider, MD  Multiple Vitamin (MULTIVITAMIN WITH MINERALS) TABS tablet Take 1 tablet by mouth daily.   Yes Historical Provider, MD  omeprazole (PRILOSEC) 20 MG capsule Take 20 mg by mouth every other day.   Yes Historical Provider, MD  oxyCODONE (OXY IR/ROXICODONE) 5 MG immediate release tablet Take 1-2 tablets (5-10 mg total) by mouth every 4 (four) hours as needed for severe pain (For pain.). Patient not taking: Reported on 11/23/2015 07/19/15   Ardis Hughs, MD    Family History Family History  Problem Relation Age of Onset  . Lung cancer Mother     Social History Social History  Substance Use Topics  .  Smoking status: Former Smoker    Packs/day: 0.25    Years: 20.00    Types: Cigarettes    Quit date: 04/16/2015  . Smokeless tobacco: Never Used  . Alcohol use Yes     Comment: 1-2 a day     Allergies   Sulfa antibiotics   Review of Systems Review of Systems  Gastrointestinal: Positive for abdominal pain and vomiting.  All other systems reviewed and are negative.    Physical Exam Updated Vital Signs BP 143/86 (BP Location: Right Arm)   Pulse 81   Temp 97.6 F (36.4 C) (Oral)   Resp 16   Ht 6\' 2"  (1.88 m)   Wt 212 lb (96.2 kg)   SpO2 99%   BMI 27.22 kg/m   Physical Exam  Constitutional: He is oriented to person, place, and time.    Uncomfortable   HENT:  Head: Normocephalic.  Eyes: Pupils are equal, round, and reactive to light.  Neck: Normal range of motion. Neck supple.  Cardiovascular: Normal rate, regular rhythm and normal heart sounds.   Pulmonary/Chest: Effort normal and breath sounds normal. No respiratory distress. He has no wheezes. He has no rales.  Abdominal:  Distended, large LUQ surgical scar. Mild diffuse tenderness worse in LUQ.   Musculoskeletal: Normal range of motion.  Neurological: He is alert and oriented to person, place, and time.  Skin: Skin is warm.  Psychiatric: He has a normal mood and affect.  Nursing note and vitals reviewed.    ED Treatments / Results  Labs (all labs ordered are listed, but only abnormal results are displayed) Labs Reviewed  CBC WITH DIFFERENTIAL/PLATELET - Abnormal; Notable for the following:       Result Value   Hemoglobin 11.8 (*)    HCT 37.8 (*)    MCH 25.1 (*)    RDW 16.8 (*)    All other components within normal limits  COMPREHENSIVE METABOLIC PANEL - Abnormal; Notable for the following:    CO2 20 (*)    Glucose, Bld 159 (*)    Total Protein 8.8 (*)    AST 88 (*)    All other components within normal limits  URINALYSIS, ROUTINE W REFLEX MICROSCOPIC (NOT AT [facility]) - Abnormal; Notable for the following:    Color, Urine AMBER (*)    All other components within normal limits  LIPASE, BLOOD - Abnormal; Notable for the following:    Lipase 4,071 (*)    All other components within normal limits    EKG  EKG Interpretation None       Radiology Ct Abdomen Pelvis W Contrast  Result Date: 11/23/2015 CLINICAL DATA:  Low abdominal pain since December. Pain intermittently worse over the last month. Low-grade fever. History of melanoma. History of renal carcinoma. EXAM: CT ABDOMEN AND PELVIS WITH CONTRAST TECHNIQUE: Multidetector CT imaging of the abdomen and pelvis was performed using the standard protocol following bolus administration of intravenous  contrast. CONTRAST:  121mL ISOVUE-300 IOPAMIDOL (ISOVUE-300) INJECTION 61% COMPARISON:  MRI, 06/30/2015.  CT, 01/13/2015. FINDINGS: Lower chest: No acute abnormality.  No lung base nodules. Hepatobiliary: No focal liver abnormality is seen. No gallstones, gallbladder wall thickening, or biliary dilatation. Pancreas: There is hazy opacity and fluid attenuation tracking along the pancreas. There are no pancreatic masses. No duct dilation. There is uniform enhancement of the pancreas. Spleen: Mildly enlarged measuring 14.1 x 5.5 x 14.2 cm, previously, 11.8 x 5.1 x 10.1 cm. No splenic mass or focal lesion. Adrenals/Urinary Tract: No adrenal masses.  There is scarring from the mid to upper pole the left kidney reflecting a partial nephrectomy for excision of the previously seen large renal mass. No residual or new left renal mass. No stones. No hydronephrosis. Normal appearance of the right kidney. Normal ureters. Bladder is unremarkable. Stomach/Bowel: Stomach is unremarkable. The duodenum shows wall thickening and adjacent fluid attenuation and inflammatory stranding. This extends from the duodenal bulb to the third portion of the duodenum, centered on the second portion of the duodenum. The small bowel is unremarkable. Unremarkable colon. Normal appendix. Vascular/Lymphatic: Atherosclerotic calcifications are noted along a mildly ectatic abdominal aorta. Prominent to mildly enlarged lymph nodes noted along the gastrohepatic ligament, largest measuring 17 mm in short axis. There are prominent, but not pathologically enlarged, peri celiac lymph nodes. Reproductive: Prostate is unremarkable. Other: Trace ascites is seen adjacent to the liver. No abdominal wall hernia. Musculoskeletal: No acute or significant osseous findings. IMPRESSION: 1. Inflammatory changes are seen extending along the duodenum centered on the second portion. There are also inflammatory changes extending along the margins of the pancreas. This is most  likely duodenitis. Pancreatitis is possible. 2. Prominent periceliac and prominent to mildly enlarged gastrohepatic ligament lymph nodes, likely reactive. 3. Trace ascites. 4. Mild splenomegaly which has developed since the prior CT. 5. Status post partial nephrectomy on the left for resection of the left renal cell carcinoma. No evidence of a new or residual renal cell carcinoma. 6. Aortic atherosclerosis. Electronically Signed   By: Lajean Manes M.D.   On: 11/23/2015 10:57    Procedures Procedures (including critical care time)  CRITICAL CARE Performed by: Wandra Arthurs   Total critical care time: 30 minutes  Critical care time was exclusive of separately billable procedures and treating other patients.  Critical care was necessary to treat or prevent imminent or life-threatening deterioration.  Critical care was time spent personally by me on the following activities: development of treatment plan with patient and/or surrogate as well as nursing, discussions with consultants, evaluation of patient's response to treatment, examination of patient, obtaining history from patient or surrogate, ordering and performing treatments and interventions, ordering and review of laboratory studies, ordering and review of radiographic studies, pulse oximetry and re-evaluation of patient's condition.   Medications Ordered in ED Medications  morphine 4 MG/ML injection 4 mg (4 mg Intravenous Given 11/23/15 0917)  ondansetron (ZOFRAN) injection 4 mg (4 mg Intravenous Given 11/23/15 0915)  sodium chloride 0.9 % bolus 1,000 mL (0 mLs Intravenous Stopped 11/23/15 1101)  iopamidol (ISOVUE-300) 61 % injection 100 mL (100 mLs Intravenous Contrast Given 11/23/15 1032)  morphine 4 MG/ML injection 4 mg (4 mg Intravenous Given 11/23/15 1104)  sodium chloride 0.9 % bolus 1,000 mL (1,000 mLs Intravenous New Bag/Given 11/23/15 1131)     Initial Impression / Assessment and Plan / ED Course  I have reviewed the triage  vital signs and the nursing notes.  Pertinent labs & imaging results that were available during my care of the patient were reviewed by me and considered in my medical decision making (see chart for details).  Clinical Course   James [name] is a 55 y.o. male here with abdominal pain, distention. Consider recurrent cancer vs early SBO. Will get labs, UA, CT ab/pel.   12:01 PM CT showed pancreatitis. Lipase 4000. He admits to drinking alcohol daily. Likely alcohol pancreatitis. Given second NS bolus, more pain meds. Will admit    Final Clinical Impressions(s) / ED Diagnoses   Final diagnoses:  None  New Prescriptions New Prescriptions   No medications on file     Drenda Freeze, MD 11/23/15 1202

## 2015-11-23 NOTE — ED Triage Notes (Signed)
Pt complains of generalized abdominal pain, along with shortness of breath; pt states had surgery in December 2016 to remove tumor cyst from kidney.

## 2015-11-23 NOTE — H&P (Signed)
History and Physical  James [name] DOB: 1960-08-17 DOA: 11/23/2015  Referring physician: Darl Householder, MD PCP: No PCP Per Patient   Chief Complaint: Abdominal pain  HPI: James [name] is a 55 y.o. alcoholic male with known history of CKD, GERD, HTN, L renal mass s/p resection who presented to ED with abdominal pain. Patient had a left renal mass as was resected in May of this year. Patient states that over the last month or so he has progressive swelling and pain in the left side of his abdomen where the incision was. This morning, he woke up with severe left-sided abdominal pain associated with one episode of vomiting. States that his abdomen is more distended than usual but he is still passing gas. Also has normal bowel movement recently. No history of small bowel obstruction.  He has had some nausea and vomiting.   He was seen in the emergency department and noted to have a lipase level greater than 4000. He also had a CT of the abdomen that was consistent with pancreatitis but no recurrence of cancer seen. He is being admitted for acute pancreatitis treatment. He has been made nothing by mouth and hospitalization was requested.  Review of Systems: All systems reviewed and apart from history of presenting illness, are negative.  Past Medical History:  Diagnosis Date  . Arthritis    right knee right wrist   . Chronic kidney disease   . GERD (gastroesophageal reflux disease)   . Hypertension    pt states was treated with medication back in 2000; currently on no medication   . Left renal mass   . Melanoma ([place]) dx'd 72yrs ago age 72   rt lat chest wall--surg only  . Renal cancer ([place]) dx'd 01/2015   left nephrectomy  . Wears glasses    Past Surgical History:  Procedure Laterality Date  . ABDOMINAL SURGERY     remove tumor cyst   . CYSTOSCOPY WITH RETROGRADE PYELOGRAM, URETEROSCOPY AND STENT PLACEMENT Left 07/19/2015   Procedure: LEFT RETROGRADE PYELOGRAM, LEFT  URETEROSCOPY AND LEFT URETERAL STENT PLACEMENT;  Surgeon: Ardis Hughs, MD;  Location: [facility];  Service: Urology;  Laterality: Left;  . drained cyst      left kidney 10 months ago   . lymph nodes removed      right armpit secondary to melanoma   . NEPHRECTOMY Left 02/15/2015   Procedure: LEFT OPEN PARTIAL NEPHRECTOMY;  Surgeon: Ardis Hughs, MD;  Location: [facility];  Service: Urology;  Laterality: Left;  . right armpit surgery     for melanoma   Social History:  reports that he quit smoking about 7 months ago. His smoking use included Cigarettes. He has a 5.00 pack-year smoking history. He has never used smokeless tobacco. He reports that he drinks alcohol. He reports that he does not use drugs.   Allergies  Allergen Reactions  . Sulfa Antibiotics Swelling    Family History  Problem Relation Age of Onset  . Lung cancer Mother     Prior to Admission medications   Medication Sig Start Date End Date Taking? Authorizing Provider  ibuprofen (ADVIL,MOTRIN) 200 MG tablet Take 800 mg by mouth every 6 (six) hours as needed for moderate pain.   Yes Historical Provider, MD  Multiple Vitamin (MULTIVITAMIN WITH MINERALS) TABS tablet Take 1 tablet by mouth daily.   Yes Historical Provider, MD  omeprazole (PRILOSEC) 20 MG capsule Take 20 mg by mouth every other day.  Yes Historical Provider, MD  oxyCODONE (OXY IR/ROXICODONE) 5 MG immediate release tablet Take 1-2 tablets (5-10 mg total) by mouth every 4 (four) hours as needed for severe pain (For pain.). Patient not taking: Reported on 11/23/2015 07/19/15   Ardis Hughs, MD   Physical Exam: Vitals:   11/23/15 0819 11/23/15 0820 11/23/15 1101  BP: 154/97  143/86  Pulse: 85  81  Resp: 16  16  Temp: 97.6 F (36.4 C)    TempSrc: Oral    SpO2: 99%  99%  Weight:  96.2 kg (212 lb)   Height:  6\' 2"  (1.88 m)    Constitutional: He is oriented to person, place, and time. Appears uncomfortable   HENT: Head: Normocephalic.  Eyes:  Pupils are equal, round, and reactive to light. No scleral icterus. Neck: Normal range of motion. Neck supple.  Cardiovascular: Normal rate, regular rhythm and normal heart sounds.   Pulmonary/Chest: Effort normal and breath sounds normal. No respiratory distress. He has no wheezes. He has no rales.  Abdominal: Distended, large LUQ surgical scar.  Mild diffuse tenderness worse in LUQ.   Musculoskeletal: Normal range of motion.  Neurological: He is alert and oriented to person, place, and time.  Skin: Skin is warm.  Psychiatric: He has a normal mood and affect.   Labs on Admission:  Basic Metabolic Panel:  Recent Labs Lab 11/23/15 0911  NA 139  K 3.6  CL 109  CO2 20*  GLUCOSE 159*  BUN 8  CREATININE 0.87  CALCIUM 9.0   Liver Function Tests:  Recent Labs Lab 11/23/15 0911  AST 88*  ALT 48  ALKPHOS 63  BILITOT 1.2  PROT 8.8*  ALBUMIN 4.1    Recent Labs Lab 11/23/15 0911  LIPASE 4,071*   No results for input(s): AMMONIA in the last 168 hours. CBC:  Recent Labs Lab 11/23/15 0911  WBC 6.6  NEUTROABS 5.0  HGB 11.8*  HCT 37.8*  MCV 80.4  PLT 203   Cardiac Enzymes: No results for input(s): CKTOTAL, CKMB, CKMBINDEX, TROPONINI in the last 168 hours.  BNP (last 3 results) No results for input(s): PROBNP in the last 8760 hours. CBG: No results for input(s): GLUCAP in the last 168 hours.  Radiological Exams on Admission: Ct Abdomen Pelvis W Contrast  Result Date: 11/23/2015 CLINICAL DATA:  Low abdominal pain since December. Pain intermittently worse over the last month. Low-grade fever. History of melanoma. History of renal carcinoma. EXAM: CT ABDOMEN AND PELVIS WITH CONTRAST TECHNIQUE: Multidetector CT imaging of the abdomen and pelvis was performed using the standard protocol following bolus administration of intravenous contrast. CONTRAST:  122mL ISOVUE-300 IOPAMIDOL (ISOVUE-300) INJECTION 61% COMPARISON:  MRI, 06/30/2015.  CT, 01/13/2015. FINDINGS: Lower  chest: No acute abnormality.  No lung base nodules. Hepatobiliary: No focal liver abnormality is seen. No gallstones, gallbladder wall thickening, or biliary dilatation. Pancreas: There is hazy opacity and fluid attenuation tracking along the pancreas. There are no pancreatic masses. No duct dilation. There is uniform enhancement of the pancreas. Spleen: Mildly enlarged measuring 14.1 x 5.5 x 14.2 cm, previously, 11.8 x 5.1 x 10.1 cm. No splenic mass or focal lesion. Adrenals/Urinary Tract: No adrenal masses. There is scarring from the mid to upper pole the left kidney reflecting a partial nephrectomy for excision of the previously seen large renal mass. No residual or new left renal mass. No stones. No hydronephrosis. Normal appearance of the right kidney. Normal ureters. Bladder is unremarkable. Stomach/Bowel: Stomach is unremarkable. The duodenum shows wall  thickening and adjacent fluid attenuation and inflammatory stranding. This extends from the duodenal bulb to the third portion of the duodenum, centered on the second portion of the duodenum. The small bowel is unremarkable. Unremarkable colon. Normal appendix. Vascular/Lymphatic: Atherosclerotic calcifications are noted along a mildly ectatic abdominal aorta. Prominent to mildly enlarged lymph nodes noted along the gastrohepatic ligament, largest measuring 17 mm in short axis. There are prominent, but not pathologically enlarged, peri celiac lymph nodes. Reproductive: Prostate is unremarkable. Other: Trace ascites is seen adjacent to the liver. No abdominal wall hernia. Musculoskeletal: No acute or significant osseous findings. IMPRESSION: 1. Inflammatory changes are seen extending along the duodenum centered on the second portion. There are also inflammatory changes extending along the margins of the pancreas. This is most likely duodenitis. Pancreatitis is possible. 2. Prominent periceliac and prominent to mildly enlarged gastrohepatic ligament lymph nodes,  likely reactive. 3. Trace ascites. 4. Mild splenomegaly which has developed since the prior CT. 5. Status post partial nephrectomy on the left for resection of the left renal cell carcinoma. No evidence of a new or residual renal cell carcinoma. 6. Aortic atherosclerosis. Electronically Signed   By: Lajean Manes M.D.   On: 11/23/2015 10:57   EKG: Independently reviewed.   Assessment/Plan Principal Problem:   Alcohol induced acute pancreatitis Active Problems:   Alcohol dependence (HCC)   Elevated lipase   GERD (gastroesophageal reflux disease)   Renal cell carcinoma ([facility])   Pancreatitis   Acute pancreatitis  1. Acute Pancreatitis - highly suspect that this is alcohol induced. Checking a lipid panel in the morning. Keep patient nothing by mouth. IV fluid hydration ordered. IV pain medications ordered. Repeat and trend lipase level. IV nausea medications ordered as needed. 2. Alcohol dependence-concerned about possible alcohol withdrawal, instituted CIWA protocol with lorazepam. Vitamins ordered. 3. GERD-Protonix ordered for GI protection. 4. History of renal cell carcinoma status post recent partial left nephrectomy-CT abdomen reviewed and no recurrence of cancer noted.   DVT Prophylaxis: lovenox Code Status: Full  Family Communication: none at bedside  Disposition Plan: Home when medically stabilized   Irwin Brakeman, MD [facility] Pager [phone]  If 7PM-7AM, please contact night-coverage [email or web address] Password [facility] 11/23/2015, 12:08 PM

## 2015-11-23 NOTE — ED Triage Notes (Signed)
Patient denies any nausea, vomiting, or blood in urine and stool.

## 2015-11-23 NOTE — Progress Notes (Signed)
RN received report from ED, Pt arrived unit, alert and oriented, able to communicate needs. MD notified of Pt's location. Will continue with current plan of care. 

## 2015-11-23 NOTE — ED Notes (Signed)
Attempted to call report and was asked to call back in 15 minutes.

## 2015-11-24 LAB — COMPREHENSIVE METABOLIC PANEL
ALT: 32 U/L (ref 17–63)
AST: 59 U/L — ABNORMAL HIGH (ref 15–41)
Albumin: 3.3 g/dL — ABNORMAL LOW (ref 3.5–5.0)
Alkaline Phosphatase: 48 U/L (ref 38–126)
Anion gap: 8 (ref 5–15)
BUN: 6 mg/dL (ref 6–20)
CO2: 20 mmol/L — ABNORMAL LOW (ref 22–32)
Calcium: 7.9 mg/dL — ABNORMAL LOW (ref 8.9–10.3)
Chloride: 108 mmol/L (ref 101–111)
Creatinine, Ser: 0.71 mg/dL (ref 0.61–1.24)
GFR calc Af Amer: >60 mL/min (ref >60)
GFR calc non Af Amer: >60 mL/min (ref >60)
Glucose, Bld: 94 mg/dL (ref 65–99)
Potassium: 3.4 mmol/L — ABNORMAL LOW (ref 3.5–5.1)
Sodium: 136 mmol/L (ref 135–145)
Total Bilirubin: 1.1 mg/dL (ref 0.3–1.2)
Total Protein: 7.1 g/dL (ref 6.5–8.1)

## 2015-11-24 LAB — CBC
HCT: 35.1 % — ABNORMAL LOW (ref 39.0–52.0)
Hemoglobin: 11 g/dL — ABNORMAL LOW (ref 13.0–17.0)
MCH: 25.3 pg — ABNORMAL LOW (ref 26.0–34.0)
MCHC: 31.3 g/dL (ref 30.0–36.0)
MCV: 80.7 fL (ref 78.0–100.0)
Platelets: 167 10*3/uL (ref 150–400)
RBC: 4.35 MIL/uL (ref 4.22–5.81)
RDW: 17 % — ABNORMAL HIGH (ref 11.5–15.5)
WBC: 5.4 10*3/uL (ref 4.0–10.5)

## 2015-11-24 LAB — LIPID PANEL
Cholesterol: 140 mg/dL (ref 0–200)
HDL: 19 mg/dL — ABNORMAL LOW (ref >40)
LDL Cholesterol: 98 mg/dL (ref 0–99)
Total CHOL/HDL Ratio: 7.4 RATIO
Triglycerides: 117 mg/dL (ref <150)
VLDL: 23 mg/dL (ref 0–40)

## 2015-11-24 LAB — LIPASE, BLOOD: Lipase: 973 U/L — ABNORMAL HIGH (ref 11–51)

## 2015-11-24 MED ORDER — POTASSIUM CHLORIDE CRYS ER 20 MEQ PO TBCR
40.0000 meq | EXTENDED_RELEASE_TABLET | Freq: Once | ORAL | Status: AC
  Administered 2015-11-24: 40 meq via ORAL
  Filled 2015-11-24: qty 2

## 2015-11-24 NOTE — Progress Notes (Signed)
PROGRESS NOTE    James [name]  [id number] DOB: Jul 17, 1960 DOA: 11/23/2015 PCP: No PCP Per Patient    Brief Narrative:  [id number] with hx of ETOH abuse who presents with abd pain. Patient found to have acute pancreatitis.  Assessment & Plan:   Principal Problem:   Alcohol induced acute pancreatitis Active Problems:   Alcohol dependence (HCC)   GERD (gastroesophageal reflux disease)   Renal cell carcinoma (HCC)   Elevated lipase   Pancreatitis   Acute pancreatitis    1. Acute Pancreatitis 1. Likely etoh related given history 2. Presenting lipase over 4k, now down to 970 3. Patient continues with abd pain 4. Continue NPO and bowel rest with IVF and analgesics 5. Will recheck cmp in am and repeat lipase 2. Alcohol dependence 1. On CIWA protocol 2. Last ETOH intake was on the day prior to admission 3. Stable at present although pt does appear somewhat diaphoretic 3. GERD 1. Protonix continued for GI protection. 4. History of renal cell carcinoma status post recent partial left nephrectomy 1. Stable 2. No evidence of recurrence on CT  DVT prophylaxis: Lovenox Code Status: Full Family Communication: Pt in room, family not at bedside Disposition Plan: uncertain at this time  Consultants:     Procedures:     Antimicrobials: Anti-infectives    None       Subjective: Still complains of abd pain  Objective: Vitals:   11/23/15 1510 11/23/15 2151 11/24/15 0748 11/24/15 1335  BP: (!) 151/95 (!) 146/90 (!) 150/97 (!) 153/85  Pulse: 78 76 84 90  Resp: 17 18 20 20   Temp: 98.2 F (36.8 C) 97.8 F (36.6 C) 97.9 F (36.6 C) 98.2 F (36.8 C)  TempSrc: Oral Oral Oral Oral  SpO2: 99% 96% 97% 97%  Weight:      Height:        Intake/Output Summary (Last 24 hours) at 11/24/15 1738 Last data filed at 11/24/15 [id number]  Gross per 24 hour  Intake           2007.5 ml  Output             1200 ml  Net            807.5 ml   Filed Weights   11/23/15 0820  Weight:  96.2 kg (212 lb)    Examination:  General exam: Appears calm and somewhat uncomfortable Respiratory system: Clear to auscultation. Respiratory effort normal. Cardiovascular system: S1 & S2 heard, RRR. No JVD, murmurs, rubs, gallops or clicks. No pedal edema. Gastrointestinal system: Abdomen tender, no masses Central nervous system: Alert and oriented. No focal neurological deficits. Extremities: Symmetric 5 x 5 power. Skin: No rashes, lesions or ulcers Psychiatry: Judgement and insight appear normal. Mood & affect appropriate.   Data Reviewed: I have personally reviewed following labs and imaging studies  CBC:  Recent Labs Lab 11/23/15 0911 11/24/15 0552  WBC 6.6 5.4  NEUTROABS 5.0  --   HGB 11.8* 11.0*  HCT 37.8* 35.1*  MCV 80.4 80.7  PLT 203 [id number]   Basic Metabolic Panel:  Recent Labs Lab 11/23/15 0911 11/24/15 0552  NA 139 136  K 3.6 3.4*  CL 109 108  CO2 20* 20*  GLUCOSE 159* 94  BUN 8 6  CREATININE 0.87 0.71  CALCIUM 9.0 7.9*   GFR: Estimated Creatinine Clearance: 121.3 mL/min (by C-G formula based on SCr of 0.71 mg/dL). Liver Function Tests:  Recent Labs Lab 11/23/15 0911 11/24/15 0552  AST 88* 59*  ALT 48 32  ALKPHOS 63 48  BILITOT 1.2 1.1  PROT 8.8* 7.1  ALBUMIN 4.1 3.3*    Recent Labs Lab 11/23/15 0911 11/24/15 0552  LIPASE 4,071* 973*   No results for input(s): AMMONIA in the last 168 hours. Coagulation Profile: No results for input(s): INR, PROTIME in the last 168 hours. Cardiac Enzymes: No results for input(s): CKTOTAL, CKMB, CKMBINDEX, TROPONINI in the last 168 hours. BNP (last 3 results) No results for input(s): PROBNP in the last 8760 hours. HbA1C: No results for input(s): HGBA1C in the last 72 hours. CBG: No results for input(s): GLUCAP in the last 168 hours. Lipid Profile:  Recent Labs  11/24/15 0552  CHOL 140  HDL 19*  LDLCALC 98  TRIG 117  CHOLHDL 7.4   Thyroid Function Tests: No results for input(s): TSH,  T4TOTAL, FREET4, T3FREE, THYROIDAB in the last 72 hours. Anemia Panel: No results for input(s): VITAMINB12, FOLATE, FERRITIN, TIBC, IRON, RETICCTPCT in the last 72 hours. Sepsis Labs: No results for input(s): PROCALCITON, LATICACIDVEN in the last 168 hours.  No results found for this or any previous visit (from the past 240 hour(s)).   Radiology Studies: Ct Abdomen Pelvis W Contrast  Result Date: 11/23/2015 CLINICAL DATA:  Low abdominal pain since December. Pain intermittently worse over the last month. Low-grade fever. History of melanoma. History of renal carcinoma. EXAM: CT ABDOMEN AND PELVIS WITH CONTRAST TECHNIQUE: Multidetector CT imaging of the abdomen and pelvis was performed using the standard protocol following bolus administration of intravenous contrast. CONTRAST:  169mL ISOVUE-300 IOPAMIDOL (ISOVUE-300) INJECTION 61% COMPARISON:  MRI, 06/30/2015.  CT, 01/13/2015. FINDINGS: Lower chest: No acute abnormality.  No lung base nodules. Hepatobiliary: No focal liver abnormality is seen. No gallstones, gallbladder wall thickening, or biliary dilatation. Pancreas: There is hazy opacity and fluid attenuation tracking along the pancreas. There are no pancreatic masses. No duct dilation. There is uniform enhancement of the pancreas. Spleen: Mildly enlarged measuring 14.1 x 5.5 x 14.2 cm, previously, 11.8 x 5.1 x 10.1 cm. No splenic mass or focal lesion. Adrenals/Urinary Tract: No adrenal masses. There is scarring from the mid to upper pole the left kidney reflecting a partial nephrectomy for excision of the previously seen large renal mass. No residual or new left renal mass. No stones. No hydronephrosis. Normal appearance of the right kidney. Normal ureters. Bladder is unremarkable. Stomach/Bowel: Stomach is unremarkable. The duodenum shows wall thickening and adjacent fluid attenuation and inflammatory stranding. This extends from the duodenal bulb to the third portion of the duodenum, centered on the  second portion of the duodenum. The small bowel is unremarkable. Unremarkable colon. Normal appendix. Vascular/Lymphatic: Atherosclerotic calcifications are noted along a mildly ectatic abdominal aorta. Prominent to mildly enlarged lymph nodes noted along the gastrohepatic ligament, largest measuring 17 mm in short axis. There are prominent, but not pathologically enlarged, peri celiac lymph nodes. Reproductive: Prostate is unremarkable. Other: Trace ascites is seen adjacent to the liver. No abdominal wall hernia. Musculoskeletal: No acute or significant osseous findings. IMPRESSION: 1. Inflammatory changes are seen extending along the duodenum centered on the second portion. There are also inflammatory changes extending along the margins of the pancreas. This is most likely duodenitis. Pancreatitis is possible. 2. Prominent periceliac and prominent to mildly enlarged gastrohepatic ligament lymph nodes, likely reactive. 3. Trace ascites. 4. Mild splenomegaly which has developed since the prior CT. 5. Status post partial nephrectomy on the left for resection of the left renal cell carcinoma. No evidence of a new or  residual renal cell carcinoma. 6. Aortic atherosclerosis. Electronically Signed   By: Lajean Manes M.D.   On: 11/23/2015 10:57    Scheduled Meds: . enoxaparin (LOVENOX) injection  40 mg Subcutaneous Q24H  . folic acid  1 mg Oral Daily  . multivitamin with minerals  1 tablet Oral Daily  . pantoprazole  40 mg Oral BID  . thiamine  100 mg Oral Daily   Or  . thiamine  100 mg Intravenous Daily   Continuous Infusions: . sodium chloride 150 mL/hr at 11/24/15 1358     LOS: 1 day   CHIU, Orpah Melter, MD [facility] Pager [phone]  If 7PM-7AM, please contact night-coverage [email or web address] Password [email or web address] 11/24/2015, 5:38 PM

## 2015-11-25 ENCOUNTER — Inpatient Hospital Stay (HOSPITAL_COMMUNITY): Payer: Self-pay

## 2015-11-25 DIAGNOSIS — K858 Other acute pancreatitis without necrosis or infection: Secondary | ICD-10-CM

## 2015-11-25 LAB — LIPASE, BLOOD
Lipase: 296 U/L — ABNORMAL HIGH (ref 11–51)
Lipase: 372 U/L — ABNORMAL HIGH (ref 11–51)

## 2015-11-25 LAB — COMPREHENSIVE METABOLIC PANEL
ALT: 27 U/L (ref 17–63)
AST: 43 U/L — ABNORMAL HIGH (ref 15–41)
Albumin: 3.2 g/dL — ABNORMAL LOW (ref 3.5–5.0)
Alkaline Phosphatase: 52 U/L (ref 38–126)
Anion gap: 7 (ref 5–15)
BUN: 7 mg/dL (ref 6–20)
CO2: 22 mmol/L (ref 22–32)
Calcium: 7.7 mg/dL — ABNORMAL LOW (ref 8.9–10.3)
Chloride: 106 mmol/L (ref 101–111)
Creatinine, Ser: 0.83 mg/dL (ref 0.61–1.24)
GFR calc Af Amer: >60 mL/min (ref >60)
GFR calc non Af Amer: >60 mL/min (ref >60)
Glucose, Bld: 91 mg/dL (ref 65–99)
Potassium: 3.5 mmol/L (ref 3.5–5.1)
Sodium: 135 mmol/L (ref 135–145)
Total Bilirubin: 1.4 mg/dL — ABNORMAL HIGH (ref 0.3–1.2)
Total Protein: 7.2 g/dL (ref 6.5–8.1)

## 2015-11-25 LAB — GLUCOSE, CAPILLARY: Glucose-Capillary: 107 mg/dL — ABNORMAL HIGH (ref 65–99)

## 2015-11-25 LAB — TROPONIN I: Troponin I: <0.03 ng/mL (ref <0.03)

## 2015-11-25 MED ORDER — SODIUM CHLORIDE 0.9 % IV SOLN
INTRAVENOUS | Status: DC
  Administered 2015-11-26 – 2015-11-28 (×5): via INTRAVENOUS

## 2015-11-25 MED ORDER — POLYETHYLENE GLYCOL 3350 17 G PO PACK
17.0000 g | PACK | Freq: Every day | ORAL | Status: DC
  Administered 2015-11-25 – 2015-11-29 (×5): 17 g via ORAL
  Filled 2015-11-25 (×5): qty 1

## 2015-11-25 MED ORDER — DOCUSATE SODIUM 100 MG PO CAPS
100.0000 mg | ORAL_CAPSULE | Freq: Two times a day (BID) | ORAL | Status: DC
  Administered 2015-11-25 – 2015-11-29 (×9): 100 mg via ORAL
  Filled 2015-11-25 (×9): qty 1

## 2015-11-25 MED ORDER — IPRATROPIUM-ALBUTEROL 0.5-2.5 (3) MG/3ML IN SOLN
3.0000 mL | RESPIRATORY_TRACT | Status: DC | PRN
  Administered 2015-11-25: 3 mL via RESPIRATORY_TRACT

## 2015-11-25 MED ORDER — IPRATROPIUM-ALBUTEROL 0.5-2.5 (3) MG/3ML IN SOLN
3.0000 mL | Freq: Four times a day (QID) | RESPIRATORY_TRACT | Status: DC
  Administered 2015-11-25: 3 mL via RESPIRATORY_TRACT
  Filled 2015-11-25 (×2): qty 3

## 2015-11-25 NOTE — Progress Notes (Signed)
PROGRESS NOTE    James [name]  [id number] DOB: 14-Jul-1960 DOA: 11/23/2015 PCP: No PCP Per Patient    Brief Narrative:  [id number] with hx of ETOH abuse who presents with abd pain. Patient found to have acute pancreatitis.  Assessment & Plan:   Principal Problem:   Alcohol induced acute pancreatitis Active Problems:   Alcohol dependence (HCC)   GERD (gastroesophageal reflux disease)   Renal cell carcinoma (HCC)   Elevated lipase   Pancreatitis   Acute pancreatitis    1. Acute Pancreatitis 1. Likely etoh related given history of chronic alcohol abuse 2. Patient's presenting lipase over 4k, lipase trended down to 296 3. Patient continues to report abdominal pains 4. Given trial of clear liquid diet. Repeat lipase up to 372 5. Will order follow-up lipase in morning 2. Alcohol dependence 1. Continued on CIWA protocol 2. Patient reports last ETOH intake was on the day prior to admission 3. Patient stable thus far. 3. GERD 1. Protonix 's continued for GI protection. 4. History of renal cell carcinoma status post recent partial left nephrectomy 1. Stable 2. No evidence of recurrence of disease on CT 2. Abdominal pain 1. Patient is abdomen appeared more distended. X-ray was ordered and reviewed by myself. 2. Evidence of stool throughout the colon was noted. No evidence for bowel obstruction. 3. Continue to promote bowel movement motility as tolerated  DVT prophylaxis: Lovenox Code Status: Full Family Communication: Pt in room, family at bedside Disposition Plan: uncertain at this time  Consultants:     Procedures:     Antimicrobials: Anti-infectives    None      Subjective: Patient reports abdominal pain, yet is wanting clear liquid diet  Objective: Vitals:   11/25/15 0635 11/25/15 1108 11/25/15 1137 11/25/15 1440  BP: 119/79 (!) 159/98  (!) 150/93  Pulse: 79 82  86  Resp: 20 (!) 26  (!) 22  Temp: 98.4 F (36.9 C)   97.5 F (36.4 C)  TempSrc: Oral    Oral  SpO2: 98% 98% 96% 98%  Weight:      Height:        Intake/Output Summary (Last 24 hours) at 11/25/15 1858 Last data filed at 11/25/15 1800  Gross per 24 hour  Intake             2160 ml  Output             2360 ml  Net             -200 ml   Filed Weights   11/23/15 0820 11/25/15 0500  Weight: 96.2 kg (212 lb) 97.4 kg (214 lb 11.7 oz)    Examination:  General exam: Lying in bed, no acute distress Respiratory system: No audible wheezing, good chest rise. Cardiovascular system: Regular rate, S1-S2 Gastrointestinal system: Distended, generally tender Central nervous system: Cranial nerves II through XII grossly intact, no tremors. Extremities: Perfused, no clubbing. Skin: Normal skin turgor, no pallor Psychiatry: Normal mood, no auditory hallucinations.   Data Reviewed: I have personally reviewed following labs and imaging studies  CBC:  Recent Labs Lab 11/23/15 0911 11/24/15 0552  WBC 6.6 5.4  NEUTROABS 5.0  --   HGB 11.8* 11.0*  HCT 37.8* 35.1*  MCV 80.4 80.7  PLT 203 [id number]   Basic Metabolic Panel:  Recent Labs Lab 11/23/15 0911 11/24/15 0552 11/25/15 0532  NA 139 136 135  K 3.6 3.4* 3.5  CL 109 108 106  CO2 20* 20* 22  GLUCOSE  159* 94 91  BUN 8 6 7   CREATININE 0.87 0.71 0.83  CALCIUM 9.0 7.9* 7.7*   GFR: Estimated Creatinine Clearance: 116.9 mL/min (by C-G formula based on SCr of 0.83 mg/dL). Liver Function Tests:  Recent Labs Lab 11/23/15 0911 11/24/15 0552 11/25/15 0532  AST 88* 59* 43*  ALT 48 32 27  ALKPHOS 63 48 52  BILITOT 1.2 1.1 1.4*  PROT 8.8* 7.1 7.2  ALBUMIN 4.1 3.3* 3.2*    Recent Labs Lab 11/23/15 0911 11/24/15 0552 11/25/15 0532 11/25/15 1605  LIPASE 4,071* 973* 296* 372*   No results for input(s): AMMONIA in the last 168 hours. Coagulation Profile: No results for input(s): INR, PROTIME in the last 168 hours. Cardiac Enzymes:  Recent Labs Lab 11/25/15 1605  TROPONINI <0.03   BNP (last 3 results) No  results for input(s): PROBNP in the last 8760 hours. HbA1C: No results for input(s): HGBA1C in the last 72 hours. CBG: No results for input(s): GLUCAP in the last 168 hours. Lipid Profile:  Recent Labs  11/24/15 0552  CHOL 140  HDL 19*  LDLCALC 98  TRIG 117  CHOLHDL 7.4   Thyroid Function Tests: No results for input(s): TSH, T4TOTAL, FREET4, T3FREE, THYROIDAB in the last 72 hours. Anemia Panel: No results for input(s): VITAMINB12, FOLATE, FERRITIN, TIBC, IRON, RETICCTPCT in the last 72 hours. Sepsis Labs: No results for input(s): PROCALCITON, LATICACIDVEN in the last 168 hours.  No results found for this or any previous visit (from the past 240 hour(s)).   Radiology Studies: Dg Abd Portable 1v  Result Date: 11/25/2015 CLINICAL DATA:  55 year old male with history of small-bowel obstruction. Pain in the mid abdominal area. EXAM: PORTABLE ABDOMEN - 1 VIEW COMPARISON:  CT the abdomen and pelvis 11/15/2015. FINDINGS: Gas and stool are seen scattered throughout the colon extending to the level of the distal rectum. No pathologic distension of small bowel is noted. No gross evidence of pneumoperitoneum. IMPRESSION: 1. Nonobstructive bowel gas pattern. 2. No pneumoperitoneum. Electronically Signed   By: Vinnie Langton M.D.   On: 11/25/2015 13:45    Scheduled Meds: . docusate sodium  100 mg Oral BID  . enoxaparin (LOVENOX) injection  40 mg Subcutaneous Q24H  . folic acid  1 mg Oral Daily  . ipratropium-albuterol  3 mL Nebulization Q6H  . multivitamin with minerals  1 tablet Oral Daily  . pantoprazole  40 mg Oral BID  . polyethylene glycol  17 g Oral Daily  . thiamine  100 mg Oral Daily   Or  . thiamine  100 mg Intravenous Daily   Continuous Infusions: . sodium chloride 125 mL/hr at 11/25/15 1100     LOS: 2 days   CHIU, Orpah Melter, MD [facility] Pager [phone]  If 7PM-7AM, please contact night-coverage [email or web address] Password [facility] 11/25/2015, 6:58 PM

## 2015-11-25 NOTE — Progress Notes (Signed)
During assessment, patient has expiratory wheeze throughout, VSS, reports exertional shortness of breath, states it is an ongoing issue since admission but he has never been diagnosed with Asthma/COPD. Pt is a former smoker. No PRN orders available - Dr. Wyline Copas notified. Skin warm, dry, pt is alert, oriented, and appropriate. Will monitor.

## 2015-11-26 LAB — COMPREHENSIVE METABOLIC PANEL
ALT: 26 U/L (ref 17–63)
AST: 54 U/L — ABNORMAL HIGH (ref 15–41)
Albumin: 3.1 g/dL — ABNORMAL LOW (ref 3.5–5.0)
Alkaline Phosphatase: 50 U/L (ref 38–126)
Anion gap: 8 (ref 5–15)
BUN: 6 mg/dL (ref 6–20)
CO2: 21 mmol/L — ABNORMAL LOW (ref 22–32)
Calcium: 8.1 mg/dL — ABNORMAL LOW (ref 8.9–10.3)
Chloride: 106 mmol/L (ref 101–111)
Creatinine, Ser: 0.79 mg/dL (ref 0.61–1.24)
GFR calc Af Amer: >60 mL/min (ref >60)
GFR calc non Af Amer: >60 mL/min (ref >60)
Glucose, Bld: 152 mg/dL — ABNORMAL HIGH (ref 65–99)
Potassium: 3.4 mmol/L — ABNORMAL LOW (ref 3.5–5.1)
Sodium: 135 mmol/L (ref 135–145)
Total Bilirubin: 1.6 mg/dL — ABNORMAL HIGH (ref 0.3–1.2)
Total Protein: 7 g/dL (ref 6.5–8.1)

## 2015-11-26 LAB — LIPASE, BLOOD: Lipase: 120 U/L — ABNORMAL HIGH (ref 11–51)

## 2015-11-26 LAB — CBC
HCT: 32.4 % — ABNORMAL LOW (ref 39.0–52.0)
Hemoglobin: 10.2 g/dL — ABNORMAL LOW (ref 13.0–17.0)
MCH: 25.4 pg — ABNORMAL LOW (ref 26.0–34.0)
MCHC: 31.5 g/dL (ref 30.0–36.0)
MCV: 80.6 fL (ref 78.0–100.0)
Platelets: 170 10*3/uL (ref 150–400)
RBC: 4.02 MIL/uL — ABNORMAL LOW (ref 4.22–5.81)
RDW: 16.8 % — ABNORMAL HIGH (ref 11.5–15.5)
WBC: 6.2 10*3/uL (ref 4.0–10.5)

## 2015-11-26 MED ORDER — MENTHOL 3 MG MT LOZG
1.0000 | LOZENGE | OROMUCOSAL | Status: DC | PRN
  Filled 2015-11-26: qty 9

## 2015-11-26 MED ORDER — IPRATROPIUM-ALBUTEROL 0.5-2.5 (3) MG/3ML IN SOLN
3.0000 mL | Freq: Two times a day (BID) | RESPIRATORY_TRACT | Status: DC
  Administered 2015-11-26 (×2): 3 mL via RESPIRATORY_TRACT
  Filled 2015-11-26 (×4): qty 3

## 2015-11-26 MED ORDER — SORBITOL 70 % SOLN
960.0000 mL | TOPICAL_OIL | Freq: Once | ORAL | Status: AC
  Administered 2015-11-26: 960 mL via RECTAL
  Filled 2015-11-26: qty 240

## 2015-11-26 NOTE — Progress Notes (Signed)
PROGRESS NOTE    BARY FERRAIOLO  [id number] DOB: 1960-10-25 DOA: 11/23/2015 PCP: No PCP Per Patient    Brief Narrative:  [id number] with hx of ETOH abuse who presents with abd pain. Patient found to have acute pancreatitis.  Assessment & Plan:   Principal Problem:   Alcohol induced acute pancreatitis Active Problems:   Alcohol dependence (HCC)   GERD (gastroesophageal reflux disease)   Renal cell carcinoma (HCC)   Elevated lipase   Pancreatitis   Acute pancreatitis  1. Acute Pancreatitis 1. Likely etoh related given history of chronic alcohol abuse 2. Patient's presenting lipase over 4k, lipase trended down to 296 3. Patient continues to report abdominal pains this AM 4. Lipase has improved to just over 100 5. We'll recheck lipase in morning 2. Alcohol dependence 1. Continued on CIWA protocol 2. Patient reports last ETOH intake was on the day prior to admission 3. Patient has remained stable so far. No evidence of withdrawals 3. GERD 1. Protonix 's has been continued for GI protection. 4. History of renal cell carcinoma status post recent partial left nephrectomy 1. Stable 2. There is no evidence of recurrence of disease on CT 2. Abdominal pain possibly secondary to constipation 1. Patient's abdomen remains distended and generally tender 2. Patient reports small hard pieces of stool during recent bowel movement 3. Positive bowel sounds on exam. Patient is tolerating by mouth intake 4. We'll give trial of smog enema  DVT prophylaxis: Lovenox Code Status: Full Family Communication: Pt in room, family at bedside Disposition Plan: uncertain at this time  Consultants:     Procedures:     Antimicrobials: Anti-infectives    None      Subjective: Complaining of continued generalized abdominal pain and bloating  Objective: Vitals:   11/25/15 2133 11/26/15 0523 11/26/15 0826 11/26/15 1329  BP: 123/73 133/84  (!) 145/79  Pulse: 94 86  87  Resp: 20 18  16     Temp: 97.4 F (36.3 C) 98.2 F (36.8 C)  98.3 F (36.8 C)  TempSrc: Oral Oral  Oral  SpO2: 97% 97% 98% 99%  Weight:      Height:        Intake/Output Summary (Last 24 hours) at 11/26/15 1601 Last data filed at 11/26/15 1329  Gross per 24 hour  Intake           2352.5 ml  Output             2775 ml  Net           -422.5 ml   Filed Weights   11/23/15 0820 11/25/15 0500  Weight: 96.2 kg (212 lb) 97.4 kg (214 lb 11.7 oz)    Examination:  General exam: Awake, conversant Respiratory system: Normal respiratory effort, no crackles. Cardiovascular system: Regular rhythm, no murmurs Gastrointestinal system: Obese, positive bowel sounds Central nervous system: No seizures, strength intact throughout. Extremities: No cyanosis, no joint deformities Skin: No notable skin lesions seen, no rashes Psychiatry: Normal affect, no visual hallucinations  Data Reviewed: I have personally reviewed following labs and imaging studies  CBC:  Recent Labs Lab 11/23/15 0911 11/24/15 0552 11/26/15 0542  WBC 6.6 5.4 6.2  NEUTROABS 5.0  --   --   HGB 11.8* 11.0* 10.2*  HCT 37.8* 35.1* 32.4*  MCV 80.4 80.7 80.6  PLT 203 167 [id number]   Basic Metabolic Panel:  Recent Labs Lab 11/23/15 0911 11/24/15 0552 11/25/15 0532 11/26/15 0542  NA 139 136 135 135  K  3.6 3.4* 3.5 3.4*  CL 109 108 106 106  CO2 20* 20* 22 21*  GLUCOSE 159* 94 91 152*  BUN 8 6 7 6   CREATININE 0.87 0.71 0.83 0.79  CALCIUM 9.0 7.9* 7.7* 8.1*   GFR: Estimated Creatinine Clearance: 121.3 mL/min (by C-G formula based on SCr of 0.79 mg/dL). Liver Function Tests:  Recent Labs Lab 11/23/15 0911 11/24/15 0552 11/25/15 0532 11/26/15 0542  AST 88* 59* 43* 54*  ALT 48 32 27 26  ALKPHOS 63 48 52 50  BILITOT 1.2 1.1 1.4* 1.6*  PROT 8.8* 7.1 7.2 7.0  ALBUMIN 4.1 3.3* 3.2* 3.1*    Recent Labs Lab 11/23/15 0911 11/24/15 0552 11/25/15 0532 11/25/15 1605 11/26/15 0542  LIPASE 4,071* 973* 296* 372* 120*   No results  for input(s): AMMONIA in the last 168 hours. Coagulation Profile: No results for input(s): INR, PROTIME in the last 168 hours. Cardiac Enzymes:  Recent Labs Lab 11/25/15 1605  TROPONINI <0.03   BNP (last 3 results) No results for input(s): PROBNP in the last 8760 hours. HbA1C: No results for input(s): HGBA1C in the last 72 hours. CBG:  Recent Labs Lab 11/25/15 2136  GLUCAP 107*   Lipid Profile:  Recent Labs  11/24/15 0552  CHOL 140  HDL 19*  LDLCALC 98  TRIG 117  CHOLHDL 7.4   Thyroid Function Tests: No results for input(s): TSH, T4TOTAL, FREET4, T3FREE, THYROIDAB in the last 72 hours. Anemia Panel: No results for input(s): VITAMINB12, FOLATE, FERRITIN, TIBC, IRON, RETICCTPCT in the last 72 hours. Sepsis Labs: No results for input(s): PROCALCITON, LATICACIDVEN in the last 168 hours.  No results found for this or any previous visit (from the past 240 hour(s)).   Radiology Studies: Dg Abd Portable 1v  Result Date: 11/25/2015 CLINICAL DATA:  55 year old male with history of small-bowel obstruction. Pain in the mid abdominal area. EXAM: PORTABLE ABDOMEN - 1 VIEW COMPARISON:  CT the abdomen and pelvis 11/15/2015. FINDINGS: Gas and stool are seen scattered throughout the colon extending to the level of the distal rectum. No pathologic distension of small bowel is noted. No gross evidence of pneumoperitoneum. IMPRESSION: 1. Nonobstructive bowel gas pattern. 2. No pneumoperitoneum. Electronically Signed   By: Vinnie Langton M.D.   On: 11/25/2015 13:45    Scheduled Meds: . docusate sodium  100 mg Oral BID  . enoxaparin (LOVENOX) injection  40 mg Subcutaneous Q24H  . folic acid  1 mg Oral Daily  . ipratropium-albuterol  3 mL Nebulization BID  . multivitamin with minerals  1 tablet Oral Daily  . pantoprazole  40 mg Oral BID  . polyethylene glycol  17 g Oral Daily  . sorbitol, milk of mag, mineral oil, glycerin (SMOG) enema  960 mL Rectal Once  . thiamine  100 mg Oral  Daily   Or  . thiamine  100 mg Intravenous Daily   Continuous Infusions: . sodium chloride Stopped (11/26/15 1340)     LOS: 3 days   CHIU, Orpah Melter, MD [facility] Pager [phone]  If 7PM-7AM, please contact night-coverage [email or web address] Password [email or web address] 11/26/2015, 4:01 PM

## 2015-11-27 LAB — LIPASE, BLOOD: Lipase: 99 U/L — ABNORMAL HIGH (ref 11–51)

## 2015-11-27 LAB — COMPREHENSIVE METABOLIC PANEL
ALT: 26 U/L (ref 17–63)
AST: 47 U/L — ABNORMAL HIGH (ref 15–41)
Albumin: 3.1 g/dL — ABNORMAL LOW (ref 3.5–5.0)
Alkaline Phosphatase: 56 U/L (ref 38–126)
Anion gap: 8 (ref 5–15)
BUN: 5 mg/dL — ABNORMAL LOW (ref 6–20)
CO2: 21 mmol/L — ABNORMAL LOW (ref 22–32)
Calcium: 8.5 mg/dL — ABNORMAL LOW (ref 8.9–10.3)
Chloride: 108 mmol/L (ref 101–111)
Creatinine, Ser: 0.75 mg/dL (ref 0.61–1.24)
GFR calc Af Amer: >60 mL/min (ref >60)
GFR calc non Af Amer: >60 mL/min (ref >60)
Glucose, Bld: 111 mg/dL — ABNORMAL HIGH (ref 65–99)
Potassium: 3.6 mmol/L (ref 3.5–5.1)
Sodium: 137 mmol/L (ref 135–145)
Total Bilirubin: 0.7 mg/dL (ref 0.3–1.2)
Total Protein: 7.3 g/dL (ref 6.5–8.1)

## 2015-11-27 MED ORDER — MORPHINE SULFATE (PF) 2 MG/ML IV SOLN
1.0000 mg | Freq: Once | INTRAVENOUS | Status: AC
  Administered 2015-11-27: 1 mg via INTRAVENOUS

## 2015-11-27 MED ORDER — METRONIDAZOLE IN NACL 5-0.79 MG/ML-% IV SOLN
500.0000 mg | Freq: Three times a day (TID) | INTRAVENOUS | Status: DC
  Administered 2015-11-27 – 2015-11-30 (×9): 500 mg via INTRAVENOUS
  Filled 2015-11-27 (×9): qty 100

## 2015-11-27 MED ORDER — SACCHAROMYCES BOULARDII 250 MG PO CAPS
250.0000 mg | ORAL_CAPSULE | Freq: Two times a day (BID) | ORAL | Status: DC
  Administered 2015-11-27 – 2015-11-30 (×7): 250 mg via ORAL
  Filled 2015-11-27 (×7): qty 1

## 2015-11-27 MED ORDER — LACTULOSE 10 GM/15ML PO SOLN
30.0000 g | Freq: Once | ORAL | Status: DC
Start: 2015-11-27 — End: 2015-11-27

## 2015-11-27 MED ORDER — CIPROFLOXACIN IN D5W 400 MG/200ML IV SOLN
400.0000 mg | Freq: Two times a day (BID) | INTRAVENOUS | Status: DC
  Administered 2015-11-27 – 2015-11-30 (×6): 400 mg via INTRAVENOUS
  Filled 2015-11-27 (×6): qty 200

## 2015-11-27 NOTE — Progress Notes (Signed)
PROGRESS NOTE    James [name]  [id number] DOB: April 26, 1960 DOA: 11/23/2015 PCP: No PCP Per Patient    Brief Narrative:  [id number] with hx of ETOH abuse who presents with abd pain. Patient found to have acute pancreatitis.  Assessment & Plan:   Principal Problem:   Alcohol induced acute pancreatitis Active Problems:   Alcohol dependence (HCC)   GERD (gastroesophageal reflux disease)   Renal cell carcinoma (HCC)   Elevated lipase   Pancreatitis   Acute pancreatitis  1. Acute Pancreatitis 1. Likely etoh related given history of chronic alcohol abuse 2. Patient was noted have a presenting lipase of over 4k 3. Lipase has trended down to 99 this morning 4. Patient continues to report abdominal pains this AM that is largely unchanged (see below) 5. We'll repeat hypertensive metabolic panel and lipase in morning 2. Alcohol dependence 1. Patient has been continued on CIWA protocol 2. Patient reports last ETOH intake was on the day prior to admission 3. Patient remains without evidence of withdrawals 3. GERD 1. Will continue protonic's for GI protection. 4. History of renal cell carcinoma status post recent partial left nephrectomy 1. Remains 2. No signs of recurrence of disease on CT 2. Abdominal pain possibly secondary to constipation 1. Patient's abdomen remains distended and generally tender this morning despite moving bowels 2. Positive bowel sounds on exam. Patient is tolerating by mouth intake 3. CT scan reviewed. Patient had evidence of duodenitis in addition to pancreatitis. 4. Will start course of empiric ciprofloxacin and Flagyl with probiotic  DVT prophylaxis: Lovenox Code Status: Full Family Communication: Pt in room, family at bedside Disposition Plan: uncertain at this time  Consultants:     Procedures:     Antimicrobials: Anti-infectives    Start     Dose/Rate Route Frequency Ordered Stop   11/27/15 1500  ciprofloxacin (CIPRO) IVPB 400 mg     400  mg 200 mL/hr over 60 Minutes Intravenous Every 12 hours 11/27/15 1429     11/27/15 1400  metroNIDAZOLE (FLAGYL) IVPB 500 mg     500 mg 100 mL/hr over 60 Minutes Intravenous Every 8 hours 11/27/15 1253        Subjective: Still complaining of abdominal pain with bloating, largely unchanged  Objective: Vitals:   11/26/15 2106 11/27/15 0450 11/27/15 0606 11/27/15 1442  BP: (!) 141/90 (!) 143/94  (!) 146/88  Pulse: 92 87  80  Resp: 16 17  17   Temp: 98.3 F (36.8 C) 98.2 F (36.8 C)  99.5 F (37.5 C)  TempSrc: Oral Oral  Oral  SpO2: 96% 97%  97%  Weight:   98.2 kg (216 lb 7.9 oz)   Height:        Intake/Output Summary (Last 24 hours) at 11/27/15 1847 Last data filed at 11/27/15 1840  Gross per 24 hour  Intake             2595 ml  Output             3125 ml  Net             -530 ml   Filed Weights   11/23/15 0820 11/25/15 0500 11/27/15 0606  Weight: 96.2 kg (212 lb) 97.4 kg (214 lb 11.7 oz) 98.2 kg (216 lb 7.9 oz)    Examination:  General exam: Lying in bed, no acute distress Respiratory system: Normal chest rise, no wheezing Cardiovascular system: Regular rate, S1-S2 Gastrointestinal system: Tenderness primarily over the left and right upper quadrants, positive  bowel sounds Central nervous system: Cranial nerves II-12 grossly intact, sensation intact throughout Extremities: No clubbing, perfused Skin: No pallor, normal skin turgor Psychiatry: Normal mood, no auditory hallucinations  Data Reviewed: I have personally reviewed following labs and imaging studies  CBC:  Recent Labs Lab 11/23/15 0911 11/24/15 0552 11/26/15 0542  WBC 6.6 5.4 6.2  NEUTROABS 5.0  --   --   HGB 11.8* 11.0* 10.2*  HCT 37.8* 35.1* 32.4*  MCV 80.4 80.7 80.6  PLT 203 167 [id number]   Basic Metabolic Panel:  Recent Labs Lab 11/23/15 0911 11/24/15 0552 11/25/15 0532 11/26/15 0542 11/27/15 0433  NA 139 136 135 135 137  K 3.6 3.4* 3.5 3.4* 3.6  CL 109 108 106 106 108  CO2 20* 20* 22 21*  21*  GLUCOSE 159* 94 91 152* 111*  BUN 8 6 7 6  5*  CREATININE 0.87 0.71 0.83 0.79 0.75  CALCIUM 9.0 7.9* 7.7* 8.1* 8.5*   GFR: Estimated Creatinine Clearance: 121.3 mL/min (by C-G formula based on SCr of 0.75 mg/dL). Liver Function Tests:  Recent Labs Lab 11/23/15 0911 11/24/15 0552 11/25/15 0532 11/26/15 0542 11/27/15 0433  AST 88* 59* 43* 54* 47*  ALT 48 32 27 26 26   ALKPHOS 63 48 52 50 56  BILITOT 1.2 1.1 1.4* 1.6* 0.7  PROT 8.8* 7.1 7.2 7.0 7.3  ALBUMIN 4.1 3.3* 3.2* 3.1* 3.1*    Recent Labs Lab 11/24/15 0552 11/25/15 0532 11/25/15 1605 11/26/15 0542 11/27/15 0433  LIPASE 973* 296* 372* 120* 99*   No results for input(s): AMMONIA in the last 168 hours. Coagulation Profile: No results for input(s): INR, PROTIME in the last 168 hours. Cardiac Enzymes:  Recent Labs Lab 11/25/15 1605  TROPONINI <0.03   BNP (last 3 results) No results for input(s): PROBNP in the last 8760 hours. HbA1C: No results for input(s): HGBA1C in the last 72 hours. CBG:  Recent Labs Lab 11/25/15 2136  GLUCAP 107*   Lipid Profile: No results for input(s): CHOL, HDL, LDLCALC, TRIG, CHOLHDL, LDLDIRECT in the last 72 hours. Thyroid Function Tests: No results for input(s): TSH, T4TOTAL, FREET4, T3FREE, THYROIDAB in the last 72 hours. Anemia Panel: No results for input(s): VITAMINB12, FOLATE, FERRITIN, TIBC, IRON, RETICCTPCT in the last 72 hours. Sepsis Labs: No results for input(s): PROCALCITON, LATICACIDVEN in the last 168 hours.  No results found for this or any previous visit (from the past 240 hour(s)).   Radiology Studies: No results found.  Scheduled Meds: . ciprofloxacin  400 mg Intravenous Q12H  . docusate sodium  100 mg Oral BID  . enoxaparin (LOVENOX) injection  40 mg Subcutaneous Q24H  . folic acid  1 mg Oral Daily  . ipratropium-albuterol  3 mL Nebulization BID  . metronidazole  500 mg Intravenous Q8H  . multivitamin with minerals  1 tablet Oral Daily  .  pantoprazole  40 mg Oral BID  . polyethylene glycol  17 g Oral Daily  . saccharomyces boulardii  250 mg Oral BID  . thiamine  100 mg Oral Daily   Continuous Infusions: . sodium chloride 75 mL/hr at 11/27/15 1752     LOS: 4 days   CHIU, Orpah Melter, MD [facility] Pager [phone]  If 7PM-7AM, please contact night-coverage [email or web address] Password [facility] 11/27/2015, 6:47 PM

## 2015-11-27 NOTE — Progress Notes (Signed)
Pharmacy Antibiotic Note  James [name] is a 55 y.o. male admitted on 11/23/2015 with pancreatitis.  Pharmacy has been consulted for cipro dosing.  Plan:  Cipro 400mg  IV q12h - no further adjustments needed, pharmacy will sign off  Continue Flagyl per MD  Height: 6\' 2"  (188 cm) Weight: 216 lb 7.9 oz (98.2 kg) IBW/kg (Calculated) : 82.2  Temp (24hrs), Avg:98.3 F (36.8 C), Min:98.2 F (36.8 C), Max:98.3 F (36.8 C)   Recent Labs Lab 11/23/15 0911 11/24/15 0552 11/25/15 0532 11/26/15 0542 11/27/15 0433  WBC 6.6 5.4  --  6.2  --   CREATININE 0.87 0.71 0.83 0.79 0.75    Estimated Creatinine Clearance: 121.3 mL/min (by C-G formula based on SCr of 0.75 mg/dL).    Allergies  Allergen Reactions  . Sulfa Antibiotics Swelling    Antimicrobials this admission: 10/2 Cipro >> 10/2 Flagyl >>  Dose adjustments this admission: ---  Microbiology results: none  Thank you for allowing pharmacy to be a part of this patient's care.  Peggyann Juba, PharmD, BCPS Pager: [phone] 11/27/2015 2:30 PM

## 2015-11-28 ENCOUNTER — Inpatient Hospital Stay (HOSPITAL_COMMUNITY): Payer: Self-pay

## 2015-11-28 DIAGNOSIS — I82812 Embolism and thrombosis of superficial veins of left lower extremities: Secondary | ICD-10-CM

## 2015-11-28 DIAGNOSIS — M79609 Pain in unspecified limb: Secondary | ICD-10-CM

## 2015-11-28 LAB — COMPREHENSIVE METABOLIC PANEL
ALT: 31 U/L (ref 17–63)
AST: 60 U/L — ABNORMAL HIGH (ref 15–41)
Albumin: 3.3 g/dL — ABNORMAL LOW (ref 3.5–5.0)
Alkaline Phosphatase: 57 U/L (ref 38–126)
Anion gap: 7 (ref 5–15)
BUN: 7 mg/dL (ref 6–20)
CO2: 22 mmol/L (ref 22–32)
Calcium: 8.9 mg/dL (ref 8.9–10.3)
Chloride: 106 mmol/L (ref 101–111)
Creatinine, Ser: 0.81 mg/dL (ref 0.61–1.24)
GFR calc Af Amer: >60 mL/min (ref >60)
GFR calc non Af Amer: >60 mL/min (ref >60)
Glucose, Bld: 114 mg/dL — ABNORMAL HIGH (ref 65–99)
Potassium: 3.6 mmol/L (ref 3.5–5.1)
Sodium: 135 mmol/L (ref 135–145)
Total Bilirubin: 0.9 mg/dL (ref 0.3–1.2)
Total Protein: 7.6 g/dL (ref 6.5–8.1)

## 2015-11-28 LAB — CBC
HCT: 33.5 % — ABNORMAL LOW (ref 39.0–52.0)
Hemoglobin: 10.6 g/dL — ABNORMAL LOW (ref 13.0–17.0)
MCH: 25.3 pg — ABNORMAL LOW (ref 26.0–34.0)
MCHC: 31.6 g/dL (ref 30.0–36.0)
MCV: 80 fL (ref 78.0–100.0)
Platelets: 171 10*3/uL (ref 150–400)
RBC: 4.19 MIL/uL — ABNORMAL LOW (ref 4.22–5.81)
RDW: 17 % — ABNORMAL HIGH (ref 11.5–15.5)
WBC: 4.8 10*3/uL (ref 4.0–10.5)

## 2015-11-28 LAB — LIPASE, BLOOD: Lipase: 118 U/L — ABNORMAL HIGH (ref 11–51)

## 2015-11-28 MED ORDER — ASPIRIN 325 MG PO TABS
325.0000 mg | ORAL_TABLET | Freq: Every day | ORAL | Status: DC
  Administered 2015-11-28 – 2015-11-30 (×3): 325 mg via ORAL
  Filled 2015-11-28 (×3): qty 1

## 2015-11-28 MED ORDER — KETOROLAC TROMETHAMINE 15 MG/ML IJ SOLN
15.0000 mg | Freq: Four times a day (QID) | INTRAMUSCULAR | Status: DC | PRN
  Administered 2015-11-28 – 2015-11-30 (×7): 15 mg via INTRAVENOUS
  Filled 2015-11-28 (×7): qty 1

## 2015-11-28 NOTE — Progress Notes (Signed)
**  Preliminary report by tech**  Left lower extremity venous duplex completed. There is evidence of acute superficial vein thrombosis involving the lesser saphenous vein of the left lower extremity.  There is no evidence of deep vein thrombosis involving the left lower extremity.  There is no evidence of a Baker's cyst on the left. Results were given to Dr. Wyline Copas.   11/28/15 11:16 AM Carlos Levering RVT

## 2015-11-28 NOTE — Progress Notes (Addendum)
PROGRESS NOTE    James [name]  [id number] DOB: 05/02/1960 DOA: 11/23/2015 PCP: No PCP Per Patient    Brief Narrative:  [id number] with hx of ETOH abuse who presents with abd pain. Patient found to have acute pancreatitis.  Assessment & Plan:   Principal Problem:   Alcohol induced acute pancreatitis Active Problems:   Alcohol dependence (HCC)   GERD (gastroesophageal reflux disease)   Renal cell carcinoma (HCC)   Elevated lipase   Pancreatitis   Acute pancreatitis   Thrombosis of saphenous vein, left  1. Acute Pancreatitis 1. Suspect etoh related given history of chronic alcohol abuse 2. Presenting lipase noted to be over 4k 3. Lipase had trended to 99, but increased to 118 this AM. Patient admitted to eating [employer] overnight. Will ensure low fat diet. 4. Repeat lipase in AM 2. Alcohol dependence 1. Patient continues with CIWA protocol 2. Patient had reported last ETOH intake was on the day prior to admission 3. Patient remains without withdrawals 3. GERD 1. Continue PPI. 4. History of renal cell carcinoma status post recent partial left nephrectomy 1. Remains stable 2. There were no signs of recurrence of disease on CT 2. Abdominal pain possibly secondary to constipation 1. Patient's abdomen had recently remained distended and generally tender despite moving bowels 2. Positive bowel sounds noted on exam. Patient is tolerating PO intake 3. CT scan was reviewed. Patient had evidence of duodenitis in addition to pancreatitis. 4. Started course of empiric ciprofloxacin and Flagyl with probiotic 5. This AM, patient has reported improvement in abd pain 3. Acute Left superficial saphenous vein thrombus 1. Patient noted to have new L LE pain and difficulty bending at the knee 2. This AM, ordered LE dopplers. Results reviewed. Findings confirmed acute left superficial saphenous vein thrombus 3. Discussed case with Hematology on call (Dr. Jana Hakim), who states recently  increased inflammatory state likely resulted in hypercoagulability, thus hypercoag work up not necessary at this time. 4. Hematology recommended treatment with compression hose and full dose aspirin (325mg ).  DVT prophylaxis: Lovenox Code Status: Full Family Communication: Pt in room, family at bedside Disposition Plan: uncertain at this time  Consultants:     Procedures:     Antimicrobials: Anti-infectives    Start     Dose/Rate Route Frequency Ordered Stop   11/27/15 1500  ciprofloxacin (CIPRO) IVPB 400 mg     400 mg 200 mL/hr over 60 Minutes Intravenous Every 12 hours 11/27/15 1429     11/27/15 1400  metroNIDAZOLE (FLAGYL) IVPB 500 mg     500 mg 100 mL/hr over 60 Minutes Intravenous Every 8 hours 11/27/15 1253        Subjective: States abd pain has improved. Complains now of left leg pain and difficulty bending at knee.  Objective: Vitals:   11/27/15 1442 11/27/15 2142 11/28/15 0406 11/28/15 1419  BP: (!) 146/88 (!) 163/88 (!) 150/98 (!) 154/85  Pulse: 80 77 79 87  Resp: 17 18 20 20   Temp: 99.5 F (37.5 C) 98.9 F (37.2 C) 98.1 F (36.7 C) 99.2 F (37.3 C)  TempSrc: Oral Oral Oral Oral  SpO2: 97% 98% 96% 98%  Weight:   95.6 kg (210 lb 12.2 oz)   Height:        Intake/Output Summary (Last 24 hours) at 11/28/15 1621 Last data filed at 11/28/15 1300  Gross per 24 hour  Intake              600 ml  Output  3050 ml  Net            -2450 ml   Filed Weights   11/25/15 0500 11/27/15 0606 11/28/15 0406  Weight: 97.4 kg (214 lb 11.7 oz) 98.2 kg (216 lb 7.9 oz) 95.6 kg (210 lb 12.2 oz)    Examination:  General exam: Awake, in nad Respiratory system: Normal respiratory effort, no crackles Cardiovascular system: Regular rhythm, no murmurs Gastrointestinal system: Pos bowel sounds, nondistended Central nervous system: No tremors, no seizures Extremities: No cyanosis, L LE more swollen with tenderness over posterior of knee Skin: No abnormal skin  lesions seen, no notable skin lesions seen Psychiatry: Normal affect, no visual hallucinations  Data Reviewed: I have personally reviewed following labs and imaging studies  CBC:  Recent Labs Lab 11/23/15 0911 11/24/15 0552 11/26/15 0542 11/28/15 0534  WBC 6.6 5.4 6.2 4.8  NEUTROABS 5.0  --   --   --   HGB 11.8* 11.0* 10.2* 10.6*  HCT 37.8* 35.1* 32.4* 33.5*  MCV 80.4 80.7 80.6 80.0  PLT 203 167 170 [id number]   Basic Metabolic Panel:  Recent Labs Lab 11/24/15 0552 11/25/15 0532 11/26/15 0542 11/27/15 0433 11/28/15 0534  NA 136 135 135 137 135  K 3.4* 3.5 3.4* 3.6 3.6  CL 108 106 106 108 106  CO2 20* 22 21* 21* 22  GLUCOSE 94 91 152* 111* 114*  BUN 6 7 6  5* 7  CREATININE 0.71 0.83 0.79 0.75 0.81  CALCIUM 7.9* 7.7* 8.1* 8.5* 8.9   GFR: Estimated Creatinine Clearance: 119.8 mL/min (by C-G formula based on SCr of 0.81 mg/dL). Liver Function Tests:  Recent Labs Lab 11/24/15 0552 11/25/15 0532 11/26/15 0542 11/27/15 0433 11/28/15 0534  AST 59* 43* 54* 47* 60*  ALT 32 27 26 26 31   ALKPHOS 48 52 50 56 57  BILITOT 1.1 1.4* 1.6* 0.7 0.9  PROT 7.1 7.2 7.0 7.3 7.6  ALBUMIN 3.3* 3.2* 3.1* 3.1* 3.3*    Recent Labs Lab 11/25/15 0532 11/25/15 1605 11/26/15 0542 11/27/15 0433 11/28/15 0534  LIPASE 296* 372* 120* 99* 118*   No results for input(s): AMMONIA in the last 168 hours. Coagulation Profile: No results for input(s): INR, PROTIME in the last 168 hours. Cardiac Enzymes:  Recent Labs Lab 11/25/15 1605  TROPONINI <0.03   BNP (last 3 results) No results for input(s): PROBNP in the last 8760 hours. HbA1C: No results for input(s): HGBA1C in the last 72 hours. CBG:  Recent Labs Lab 11/25/15 2136  GLUCAP 107*   Lipid Profile: No results for input(s): CHOL, HDL, LDLCALC, TRIG, CHOLHDL, LDLDIRECT in the last 72 hours. Thyroid Function Tests: No results for input(s): TSH, T4TOTAL, FREET4, T3FREE, THYROIDAB in the last 72 hours. Anemia Panel: No results  for input(s): VITAMINB12, FOLATE, FERRITIN, TIBC, IRON, RETICCTPCT in the last 72 hours. Sepsis Labs: No results for input(s): PROCALCITON, LATICACIDVEN in the last 168 hours.  No results found for this or any previous visit (from the past 240 hour(s)).   Radiology Studies: No results found.  Scheduled Meds: . aspirin  325 mg Oral Daily  . ciprofloxacin  400 mg Intravenous Q12H  . docusate sodium  100 mg Oral BID  . enoxaparin (LOVENOX) injection  40 mg Subcutaneous Q24H  . folic acid  1 mg Oral Daily  . metronidazole  500 mg Intravenous Q8H  . multivitamin with minerals  1 tablet Oral Daily  . pantoprazole  40 mg Oral BID  . polyethylene glycol  17 g Oral  Daily  . saccharomyces boulardii  250 mg Oral BID  . thiamine  100 mg Oral Daily   Continuous Infusions: . sodium chloride 75 mL/hr at 11/28/15 1012     LOS: 5 days   CHIU, Orpah Melter, MD [facility] Pager [phone]  If 7PM-7AM, please contact night-coverage [email or web address] Password [email or web address] 11/28/2015, 4:21 PM

## 2015-11-29 ENCOUNTER — Inpatient Hospital Stay (HOSPITAL_COMMUNITY): Payer: Self-pay

## 2015-11-29 DIAGNOSIS — K2980 Duodenitis without bleeding: Secondary | ICD-10-CM

## 2015-11-29 LAB — COMPREHENSIVE METABOLIC PANEL
ALT: 37 U/L (ref 17–63)
AST: 74 U/L — ABNORMAL HIGH (ref 15–41)
Albumin: 3.3 g/dL — ABNORMAL LOW (ref 3.5–5.0)
Alkaline Phosphatase: 59 U/L (ref 38–126)
Anion gap: 8 (ref 5–15)
BUN: 9 mg/dL (ref 6–20)
CO2: 23 mmol/L (ref 22–32)
Calcium: 8.9 mg/dL (ref 8.9–10.3)
Chloride: 106 mmol/L (ref 101–111)
Creatinine, Ser: 0.86 mg/dL (ref 0.61–1.24)
GFR calc Af Amer: >60 mL/min (ref >60)
GFR calc non Af Amer: >60 mL/min (ref >60)
Glucose, Bld: 119 mg/dL — ABNORMAL HIGH (ref 65–99)
Potassium: 3.6 mmol/L (ref 3.5–5.1)
Sodium: 137 mmol/L (ref 135–145)
Total Bilirubin: 1 mg/dL (ref 0.3–1.2)
Total Protein: 7.5 g/dL (ref 6.5–8.1)

## 2015-11-29 LAB — LIPASE, BLOOD: Lipase: 135 U/L — ABNORMAL HIGH (ref 11–51)

## 2015-11-29 MED ORDER — SENNOSIDES-DOCUSATE SODIUM 8.6-50 MG PO TABS
1.0000 | ORAL_TABLET | Freq: Two times a day (BID) | ORAL | Status: DC
  Administered 2015-11-29 (×2): 1 via ORAL
  Filled 2015-11-29 (×2): qty 1

## 2015-11-29 MED ORDER — MORPHINE SULFATE (PF) 2 MG/ML IV SOLN
1.0000 mg | INTRAVENOUS | Status: DC | PRN
  Administered 2015-11-29 – 2015-11-30 (×4): 1 mg via INTRAVENOUS
  Filled 2015-11-29 (×4): qty 1

## 2015-11-29 MED ORDER — OXYCODONE HCL 5 MG PO TABS
5.0000 mg | ORAL_TABLET | Freq: Four times a day (QID) | ORAL | Status: DC | PRN
  Administered 2015-11-29 – 2015-11-30 (×3): 5 mg via ORAL
  Filled 2015-11-29 (×3): qty 1

## 2015-11-29 MED ORDER — IOPAMIDOL (ISOVUE-300) INJECTION 61%
100.0000 mL | Freq: Once | INTRAVENOUS | Status: AC | PRN
  Administered 2015-11-29: 100 mL via INTRAVENOUS

## 2015-11-29 NOTE — Progress Notes (Signed)
PROGRESS NOTE    James [name]  [id number] DOB: [birth year] DOA: 11/23/2015 PCP: No PCP Per Patient    Brief Narrative:  [id number] with hx of ETOH abuse who presents with abd pain. Patient found to have acute pancreatitis.  Assessment & Plan:   Principal Problem:   Alcohol induced acute pancreatitis Active Problems:   Alcohol dependence (HCC)   GERD (gastroesophageal reflux disease)   Renal cell carcinoma (HCC)   Elevated lipase   Pancreatitis   Acute pancreatitis   Thrombosis of saphenous vein, left  1. Acute Pancreatitis 1. Suspect etoh related given history of chronic alcohol abuse, triglyceride 117, ct ab no mention of gall stone 2. Presenting lipase noted to be over 4k 3. Lipase had trended to 99, but increased to 118 this AM. Patient admitted to eating [employer] overnight. Will ensure low fat diet. 4. Repeat lipase continue to increase to 139, change diet to soft diet, may need to repeat ab imaging if lipase continue to increase   2. Duodenitis: on ppi, abx, avoid alcohol 3.        Acute Left superficial saphenous vein thrombus 1. Patient noted to have new L LE pain and difficulty bending at the knee 2. LE dopplers confirmed acute superficial left superficial saphenous vein thrombus 3. Discussed case with Hematology on call (Dr. Jana Hakim), who states recently increased inflammatory state likely resulted in hypercoagulability, thus hypercoag work up not necessary at this time. 4. Hematology recommended treatment with compression hose and full dose aspirin (325mg ). 4.  Alcohol dependence 1. Patient continues with CIWA protocol 2. Patient had reported last ETOH intake was on the day prior to admission 3. Patient remains without withdrawals 5. GERD 1. Continue PPI. 6. History of renal cell carcinoma status post recent partial left nephrectomy 1. Remains stable 2. There were no signs of recurrence of disease on CT   DVT prophylaxis while in the hospital:  Lovenox Code Status: Full Family Communication: Pt in room,  Disposition Plan: d/c home if lipase trending down  Consultants:   hematology  Procedures:   none  Antimicrobials: Anti-infectives    Start     Dose/Rate Route Frequency Ordered Stop   11/27/15 1500  ciprofloxacin (CIPRO) IVPB 400 mg     400 mg 200 mL/hr over 60 Minutes Intravenous Every 12 hours 11/27/15 1429     11/27/15 1400  metroNIDAZOLE (FLAGYL) IVPB 500 mg     500 mg 100 mL/hr over 60 Minutes Intravenous Every 8 hours 11/27/15 1253        Subjective: still has some abdominal discomfort, left leg pain but over all improving.    Objective: Vitals:   11/28/15 0406 11/28/15 1419 11/28/15 2136 11/29/15 0530  BP: (!) 150/98 (!) 154/85 (!) 131/91 122/81  Pulse: 79 87 70 74  Resp: 20 20 20 19   Temp: 98.1 F (36.7 C) 99.2 F (37.3 C) 98.7 F (37.1 C) 98.1 F (36.7 C)  TempSrc: Oral Oral Oral Oral  SpO2: 96% 98% 97% 100%  Weight: 95.6 kg (210 lb 12.2 oz)   94.3 kg (207 lb 14.4 oz)  Height:        Intake/Output Summary (Last 24 hours) at 11/29/15 1201 Last data filed at 11/29/15 0900  Gross per 24 hour  Intake             3525 ml  Output                0 ml  Net  3525 ml   Filed Weights   11/27/15 0606 11/28/15 0406 11/29/15 0530  Weight: 98.2 kg (216 lb 7.9 oz) 95.6 kg (210 lb 12.2 oz) 94.3 kg (207 lb 14.4 oz)    Examination:  General exam: Awake, in nad Respiratory system: Normal respiratory effort, no crackles Cardiovascular system: Regular rhythm, no murmurs Gastrointestinal system: Pos bowel sounds, nondistended, non tender , well healed scar from prior nephrectomy Central nervous system: No tremors, no seizures Extremities: No cyanosis, L LE more swollen, tenderness over posterior of knee has resolved, but he does has pain upon bending his knee Skin: No abnormal skin lesions seen, no notable skin lesions seen Psychiatry: Normal affect, no visual hallucinations  Data Reviewed:  I have personally reviewed following labs and imaging studies  CBC:  Recent Labs Lab 11/23/15 0911 11/24/15 0552 11/26/15 0542 11/28/15 0534  WBC 6.6 5.4 6.2 4.8  NEUTROABS 5.0  --   --   --   HGB 11.8* 11.0* 10.2* 10.6*  HCT 37.8* 35.1* 32.4* 33.5*  MCV 80.4 80.7 80.6 80.0  PLT 203 167 170 [id number]   Basic Metabolic Panel:  Recent Labs Lab 11/25/15 0532 11/26/15 0542 11/27/15 0433 11/28/15 0534 11/29/15 0602  NA 135 135 137 135 137  K 3.5 3.4* 3.6 3.6 3.6  CL 106 106 108 106 106  CO2 22 21* 21* 22 23  GLUCOSE 91 152* 111* 114* 119*  BUN 7 6 5* 7 9  CREATININE 0.83 0.79 0.75 0.81 0.86  CALCIUM 7.7* 8.1* 8.5* 8.9 8.9   GFR: Estimated Creatinine Clearance: 112.8 mL/min (by C-G formula based on SCr of 0.86 mg/dL). Liver Function Tests:  Recent Labs Lab 11/25/15 0532 11/26/15 0542 11/27/15 0433 11/28/15 0534 11/29/15 0602  AST 43* 54* 47* 60* 74*  ALT 27 26 26 31  37  ALKPHOS 52 50 56 57 59  BILITOT 1.4* 1.6* 0.7 0.9 1.0  PROT 7.2 7.0 7.3 7.6 7.5  ALBUMIN 3.2* 3.1* 3.1* 3.3* 3.3*    Recent Labs Lab 11/25/15 1605 11/26/15 0542 11/27/15 0433 11/28/15 0534 11/29/15 0602  LIPASE 372* 120* 99* 118* 135*   No results for input(s): AMMONIA in the last 168 hours. Coagulation Profile: No results for input(s): INR, PROTIME in the last 168 hours. Cardiac Enzymes:  Recent Labs Lab 11/25/15 1605  TROPONINI <0.03   BNP (last 3 results) No results for input(s): PROBNP in the last 8760 hours. HbA1C: No results for input(s): HGBA1C in the last 72 hours. CBG:  Recent Labs Lab 11/25/15 2136  GLUCAP 107*   Lipid Profile: No results for input(s): CHOL, HDL, LDLCALC, TRIG, CHOLHDL, LDLDIRECT in the last 72 hours. Thyroid Function Tests: No results for input(s): TSH, T4TOTAL, FREET4, T3FREE, THYROIDAB in the last 72 hours. Anemia Panel: No results for input(s): VITAMINB12, FOLATE, FERRITIN, TIBC, IRON, RETICCTPCT in the last 72 hours. Sepsis Labs: No results  for input(s): PROCALCITON, LATICACIDVEN in the last 168 hours.  No results found for this or any previous visit (from the past 240 hour(s)).   Radiology Studies: No results found.  Scheduled Meds: . aspirin  325 mg Oral Daily  . ciprofloxacin  400 mg Intravenous Q12H  . enoxaparin (LOVENOX) injection  40 mg Subcutaneous Q24H  . folic acid  1 mg Oral Daily  . metronidazole  500 mg Intravenous Q8H  . multivitamin with minerals  1 tablet Oral Daily  . pantoprazole  40 mg Oral BID  . polyethylene glycol  17 g Oral Daily  . saccharomyces boulardii  250 mg Oral BID  . senna-docusate  1 tablet Oral BID  . thiamine  100 mg Oral Daily   Continuous Infusions:     LOS: 6 days   Chasin Findling, MD PhD [facility] Pager [phone]  If 7PM-7AM, please contact night-coverage [email or web address] Password [email or web address] 11/29/2015, 12:01 PM

## 2015-11-30 LAB — COMPREHENSIVE METABOLIC PANEL
ALT: 40 U/L (ref 17–63)
AST: 81 U/L — ABNORMAL HIGH (ref 15–41)
Albumin: 3.3 g/dL — ABNORMAL LOW (ref 3.5–5.0)
Alkaline Phosphatase: 53 U/L (ref 38–126)
Anion gap: 7 (ref 5–15)
BUN: 8 mg/dL (ref 6–20)
CO2: 22 mmol/L (ref 22–32)
Calcium: 8.6 mg/dL — ABNORMAL LOW (ref 8.9–10.3)
Chloride: 108 mmol/L (ref 101–111)
Creatinine, Ser: 0.88 mg/dL (ref 0.61–1.24)
GFR calc Af Amer: >60 mL/min (ref >60)
GFR calc non Af Amer: >60 mL/min (ref >60)
Glucose, Bld: 112 mg/dL — ABNORMAL HIGH (ref 65–99)
Potassium: 3.7 mmol/L (ref 3.5–5.1)
Sodium: 137 mmol/L (ref 135–145)
Total Bilirubin: 0.7 mg/dL (ref 0.3–1.2)
Total Protein: 7.4 g/dL (ref 6.5–8.1)

## 2015-11-30 LAB — CBC
HCT: 32.7 % — ABNORMAL LOW (ref 39.0–52.0)
Hemoglobin: 10.2 g/dL — ABNORMAL LOW (ref 13.0–17.0)
MCH: 24.9 pg — ABNORMAL LOW (ref 26.0–34.0)
MCHC: 31.2 g/dL (ref 30.0–36.0)
MCV: 80 fL (ref 78.0–100.0)
Platelets: 205 10*3/uL (ref 150–400)
RBC: 4.09 MIL/uL — ABNORMAL LOW (ref 4.22–5.81)
RDW: 16.9 % — ABNORMAL HIGH (ref 11.5–15.5)
WBC: 4.5 10*3/uL (ref 4.0–10.5)

## 2015-11-30 LAB — MAGNESIUM: Magnesium: 1.8 mg/dL (ref 1.7–2.4)

## 2015-11-30 LAB — LIPASE, BLOOD: Lipase: 136 U/L — ABNORMAL HIGH (ref 11–51)

## 2015-11-30 MED ORDER — OXYCODONE-ACETAMINOPHEN 5-325 MG PO TABS: 1.0000 | ORAL_TABLET | Freq: Three times a day (TID) | ORAL | 0 refills | Status: DC | PRN

## 2015-11-30 MED ORDER — METRONIDAZOLE 500 MG PO TABS: 500.0000 mg | ORAL_TABLET | Freq: Three times a day (TID) | ORAL | 0 refills | Status: AC

## 2015-11-30 MED ORDER — ASPIRIN 325 MG PO TABS: 325.0000 mg | ORAL_TABLET | Freq: Every day | ORAL | 0 refills | Status: DC

## 2015-11-30 MED ORDER — METRONIDAZOLE 500 MG PO TABS: 500.0000 mg | ORAL_TABLET | Freq: Three times a day (TID) | ORAL | 0 refills | Status: DC

## 2015-11-30 MED ORDER — OMEPRAZOLE 20 MG PO CPDR: 20.0000 mg | DELAYED_RELEASE_CAPSULE | Freq: Two times a day (BID) | ORAL | 0 refills | Status: DC

## 2015-11-30 MED ORDER — CIPROFLOXACIN HCL 500 MG PO TABS: 500.0000 mg | ORAL_TABLET | Freq: Two times a day (BID) | ORAL | 0 refills | Status: AC

## 2015-11-30 MED ORDER — SACCHAROMYCES BOULARDII 250 MG PO CAPS: 250.0000 mg | ORAL_CAPSULE | Freq: Two times a day (BID) | ORAL | 0 refills | Status: DC

## 2015-11-30 MED ORDER — ASPIRIN 325 MG PO TABS: 325.0000 mg | ORAL_TABLET | Freq: Every day | ORAL | 0 refills | Status: AC

## 2015-11-30 MED ORDER — CIPROFLOXACIN HCL 500 MG PO TABS: 500.0000 mg | ORAL_TABLET | Freq: Two times a day (BID) | ORAL | 0 refills | Status: DC

## 2015-11-30 MED FILL — CIPROFLOXACIN HCL 500 MG TA: 500 | 10 days supply | Qty: 20 | Fill #0

## 2015-11-30 MED FILL — metroNIDAZOLE 500 MG TABS: 500 | 10 days supply | Qty: 30 | Fill #0

## 2015-11-30 MED FILL — OMEPRAZOLE 20 MG CAPSULE DR: 20 | 30 days supply | Qty: 60 | Fill #0

## 2015-11-30 MED FILL — OXYCODONE/APAP 5/325 MG TAB: 5-325 | 1 days supply | Qty: 5 | Fill #0

## 2015-11-30 NOTE — Progress Notes (Signed)
AVS reviewed at bedside, reviewed medication administration, [facility]. No further questions. Prescriptions provided to pt. Kizzie Ide, RN

## 2015-11-30 NOTE — Discharge Summary (Addendum)
Discharge Summary  James [name] [id number] DOB: 06/04/1960  PCP: No PCP Per Patient  Admit date: 11/23/2015 Discharge date: 11/30/2015  Time spent: <3mins  Recommendations for Outpatient Follow-up:  1. F/u with PMD within a week  for hospital discharge follow up, repeat cbc/cmp/lipase at follow up, pmd to follow up on final hepatitis panel, consider to refer to GI for duodenitis and pancreatitis if symptom persists. Consider to repeat left lower extremity venous US if pain in legs persist.  Discharge Diagnoses:  Active Hospital Problems   Diagnosis Date Noted  . Alcohol induced acute pancreatitis 11/23/2015  . Thrombosis of saphenous vein, left 11/28/2015  . Elevated lipase 11/23/2015  . Pancreatitis 11/23/2015  . Acute pancreatitis 11/23/2015  . Renal cell carcinoma ([place]) 02/15/2015  . GERD (gastroesophageal reflux disease) 07/14/2012  . Alcohol dependence ([place]) 05/25/2012    Resolved Hospital Problems   Diagnosis Date Noted Date Resolved  No resolved problems to display.    Discharge Condition: stable  Diet recommendation: soft , low fat diet, avoid alcohol  Filed Weights   11/27/15 0606 11/28/15 0406 11/29/15 0530  Weight: 98.2 kg (216 lb 7.9 oz) 95.6 kg (210 lb 12.2 oz) 94.3 kg (207 lb 14.4 oz)    History of present illness:  Chief Complaint: Abdominal pain  HPI: James [name] is a 55 y.o. alcoholic male with known history of CKD, GERD, HTN, L renal mass s/p resection who presented to ED with abdominal pain. Patient had a left renal mass as was resected in May of this year. Patient states that over the last month or so he has progressive swelling and pain in the left side of his abdomen where the incision was. This morning, he woke up with severe left-sided abdominal pain associated with one episode of vomiting. States that his abdomen is more distended than usual but he is still passing gas. Also has normal bowel movement recently. No history of small bowel  obstruction.  He has had some nausea and vomiting.   He was seen in the emergency department and noted to have a lipase level greater than 4000. He also had a CT of the abdomen that was consistent with pancreatitis but no recurrence of cancer seen. He is being admitted for acute pancreatitis treatment. He has been made nothing by mouth and hospitalization was requested.   Hospital Course:  Principal Problem:   Alcohol induced acute pancreatitis Active Problems:   Alcohol dependence (HCC)   GERD (gastroesophageal reflux disease)   Renal cell carcinoma (HCC)   Elevated lipase   Pancreatitis   Acute pancreatitis   Thrombosis of saphenous vein, left   Acute Pancreatitis 1. Suspect etoh related given history of chronic alcohol abuse, triglyceride 117, ct ab no mention of gall stone 2. Presenting lipase noted to be over 4k 3. Lipase had trended to 99, then  increased and seems stabilized around 135-136. Patient admitted to eating [employer] overnight. Educated about low fat diet.  repeat CT ab/pel on 10/4 due to elevated lipase shows "Regressed but not resolved inflammatory stranding and lymphadenopathy about the lesser sac compatible with Acute Pancreatitis. Secondary involvement of the duodenum, porta hepatis and anterior pararenal spaces as before."               Duodenitis: on ppi, abx, avoid alcohol, avoids NSAIDS, consider outpatient GI referral if symptom persists              Elevated lft, ast.alt, consistent with alcohol use, hepatitis panel pending,  pmd to follow up on final result.                        Acute Left superficial saphenous vein thrombus 1. Patient noted to have new L LE pain and difficulty bending at the knee 2. LE dopplers confirmed acute superficial left superficial saphenous vein thrombus 3. Discussed case with Hematology on call (Dr. Jana Hakim), who states recently increased inflammatory state likely resulted in hypercoagulability, thus hypercoag work up not  necessary at this time. 4. Hematology recommended treatment with compression hose and full dose aspirin (325mg ). 5. Consider repeat venous doppler in a few weeks if left leg symptom persists                           Alcohol dependence 1. On  CIWA protocol 2. Patient had reported last ETOH intake was on the day prior to admission 3. Patient remains without withdrawals symptoms                  GERD Continue PPI. Avoid alcohol, avoid NSAIDs                   History of renal cell carcinoma status post recent partial left nephrectomy  Remains stable, There were no signs of recurrence of disease on CT   DVT prophylaxis while in the hospital: Lovenox Code Status: Full Family Communication: Pt in room,  Disposition Plan: d/c home on 10/5  Consultants:   hematology  Procedures:   none  Antimicrobials: cipro/flagyl   Discharge Exam: BP (!) 133/97 (BP Location: Left Arm)   Pulse 66   Temp 98.1 F (36.7 C) (Oral)   Resp 18   Ht 6\' 2"  (1.88 m)   Wt 94.3 kg (207 lb 14.4 oz)   SpO2 99%   BMI 26.69 kg/m    General exam: Awake, in nad Respiratory system: Normal respiratory effort, no crackles Cardiovascular system: Regular rhythm, no murmurs Gastrointestinal system: Pos bowel sounds, nondistended, non tender , well healed scar from prior nephrectomy Central nervous system: No tremors, no seizures Extremities: No cyanosis, L LE more swollen, tenderness over posterior of knee has resolved, but he does has pain upon bending his knee Skin: No abnormal skin lesions seen, no notable skin lesions seen Psychiatry: Normal affect, no visual hallucinations   Discharge Instructions You were cared for by a hospitalist during your hospital stay. If you have any questions about your discharge medications or the care you received while you were in the hospital after you are discharged, you can call the unit and asked to speak with the hospitalist on call if the hospitalist that took  care of you is not available. Once you are discharged, your primary care physician will handle any further medical issues. Please note that NO REFILLS for any discharge medications will be authorized once you are discharged, as it is imperative that you return to your primary care physician (or establish a relationship with a primary care physician if you do not have one) for your aftercare needs so that they can reassess your need for medications and monitor your lab values.  Discharge Instructions    Diet general    Complete by:  As directed    Soft diet, low fat, avoid alcohol   Increase activity slowly    Complete by:  As directed        Medication List    STOP taking  these medications   ibuprofen 200 MG tablet Commonly known as:  ADVIL,MOTRIN   oxyCODONE 5 MG immediate release tablet Commonly known as:  Oxy IR/ROXICODONE     TAKE these medications   aspirin 325 MG tablet Take 1 tablet (325 mg total) by mouth daily.   ciprofloxacin 500 MG tablet Commonly known as:  CIPRO Take 1 tablet (500 mg total) by mouth 2 (two) times daily.   metroNIDAZOLE 500 MG tablet Commonly known as:  FLAGYL Take 1 tablet (500 mg total) by mouth 3 (three) times daily.   multivitamin with minerals Tabs tablet Take 1 tablet by mouth daily.   omeprazole 20 MG capsule Commonly known as:  PRILOSEC Take 1 capsule (20 mg total) by mouth 2 (two) times daily before a meal. What changed:  when to take this   oxyCODONE-acetaminophen 5-325 MG tablet Commonly known as:  ROXICET Take 1 tablet by mouth every 8 (eight) hours as needed for severe pain.   saccharomyces boulardii 250 MG capsule Commonly known as:  FLORASTOR Take 1 capsule (250 mg total) by mouth 2 (two) times daily.      Allergies  Allergen Reactions  . Sulfa Antibiotics Swelling   Follow-up Information    [facility] Follow up in 1 week(s).   Why:  hospital discharge follow up, repeat cbc/cmp/lipase    consider to refer to gastroenterology for duodenitis and pancreatitis if symptom  persists. consider repeat venous ultrasound if left lower extremity pain persists.  Contact information: [address] [place] [phone]           The results of significant diagnostics from this hospitalization (including imaging, microbiology, ancillary and laboratory) are listed below for reference.    Significant Diagnostic Studies: Ct Abdomen Pelvis W Contrast  Result Date: 11/29/2015 CLINICAL DATA:  55 year old male with generalized abdominal pain. duodenal and pancreatic inflammation on recent CT Abdomen and Pelvis. EXAM: CT ABDOMEN AND PELVIS WITH CONTRAST TECHNIQUE: Multidetector CT imaging of the abdomen and pelvis was performed using the standard protocol following bolus administration of intravenous contrast. CONTRAST:  177mL ISOVUE-300 IOPAMIDOL (ISOVUE-300) INJECTION 61% COMPARISON:  CT Abdomen and Pelvis 11/23/2015 and earlier. FINDINGS: Lower chest: Trace layering pleural effusions since the prior study. Dependent lower lobe atelectasis is increased. No pericardial effusion. No free air in the upper abdomen. Hepatobiliary: Liver enhancement is stable and within normal limits. Trace pericholecystic fluid, likely reactive secondary to the pancreatic findings. No biliary ductal enlargement. Pancreas: Inflammatory stranding throughout the lesser sac with peripancreatic stranding and lymphadenopathy. Pancreatic enhancement appears preserved. Secondary involvement of the duodenum, porta hepatis, and anterior para renal spaces. Only trace free fluid. The inflammatory stranding has regressed since 11/23/2015. Spleen: Mild splenomegaly, estimated splenic volume 485 mL (normal splenic volume range 83 - 412 mL). Otherwise negative. Adrenals/Urinary Tract: Negative adrenal glands aside from nearby inflammatory stranding. Postoperative changes to the left kidney. Bilateral  renal enhancement and contrast excretion remains normal. Unremarkable urinary bladder. Stomach/Bowel: Negative distal colon aside from retained stool. Mildly redundant splenic flexure. Fluid level in the transverse colon. Mostly decompressed right colon. Sequelae of appendectomy. Negative terminal ileum. fLocculated material in some distal small bowel loops. No dilated small bowel. Decompressed stomach. The duodenum is discussed with the pancreas above. Vascular/Lymphatic: Aortoiliac calcified atherosclerosis noted. Ectatic abdominal aorta and iliac arteries. Major arterial structures are patent. The portal venous system is patent. Peripancreatic and upper abdominal lymphadenopathy including involvement of gastro hepatic ligament nodes, porta hepatis nodes,  para renal nodes, and retrocrural nodes. The appearance is stable. Reproductive: Negative. Other: Trace free fluid at the pelvic inlet. No layering pelvic free fluid. Musculoskeletal: No acute osseous abnormality identified. IMPRESSION: 1. Regressed but not resolved inflammatory stranding and lymphadenopathy about the lesser sac compatible with Acute Pancreatitis. Secondary involvement of the duodenum, porta hepatis, and anterior pararenal spaces as before. 2. Trace free fluid. No organized or drainable fluid collection. No pancreatic necrosis, vascular thrombosis, or other complicating features. 3. Trace layering pleural effusions are new. Increased lung base atelectasis. 4.  Calcified aortic atherosclerosis. Electronically Signed   By: Genevie Ann M.D.   On: 11/29/2015 15:21   Ct Abdomen Pelvis W Contrast  Result Date: 11/23/2015 CLINICAL DATA:  Low abdominal pain since December. Pain intermittently worse over the last month. Low-grade fever. History of melanoma. History of renal carcinoma. EXAM: CT ABDOMEN AND PELVIS WITH CONTRAST TECHNIQUE: Multidetector CT imaging of the abdomen and pelvis was performed using the standard protocol following bolus  administration of intravenous contrast. CONTRAST:  155mL ISOVUE-300 IOPAMIDOL (ISOVUE-300) INJECTION 61% COMPARISON:  MRI, 06/30/2015.  CT, 01/13/2015. FINDINGS: Lower chest: No acute abnormality.  No lung base nodules. Hepatobiliary: No focal liver abnormality is seen. No gallstones, gallbladder wall thickening, or biliary dilatation. Pancreas: There is hazy opacity and fluid attenuation tracking along the pancreas. There are no pancreatic masses. No duct dilation. There is uniform enhancement of the pancreas. Spleen: Mildly enlarged measuring 14.1 x 5.5 x 14.2 cm, previously, 11.8 x 5.1 x 10.1 cm. No splenic mass or focal lesion. Adrenals/Urinary Tract: No adrenal masses. There is scarring from the mid to upper pole the left kidney reflecting a partial nephrectomy for excision of the previously seen large renal mass. No residual or new left renal mass. No stones. No hydronephrosis. Normal appearance of the right kidney. Normal ureters. Bladder is unremarkable. Stomach/Bowel: Stomach is unremarkable. The duodenum shows wall thickening and adjacent fluid attenuation and inflammatory stranding. This extends from the duodenal bulb to the third portion of the duodenum, centered on the second portion of the duodenum. The small bowel is unremarkable. Unremarkable colon. Normal appendix. Vascular/Lymphatic: Atherosclerotic calcifications are noted along a mildly ectatic abdominal aorta. Prominent to mildly enlarged lymph nodes noted along the gastrohepatic ligament, largest measuring 17 mm in short axis. There are prominent, but not pathologically enlarged, peri celiac lymph nodes. Reproductive: Prostate is unremarkable. Other: Trace ascites is seen adjacent to the liver. No abdominal wall hernia. Musculoskeletal: No acute or significant osseous findings. IMPRESSION: 1. Inflammatory changes are seen extending along the duodenum centered on the second portion. There are also inflammatory changes extending along the margins  of the pancreas. This is most likely duodenitis. Pancreatitis is possible. 2. Prominent periceliac and prominent to mildly enlarged gastrohepatic ligament lymph nodes, likely reactive. 3. Trace ascites. 4. Mild splenomegaly which has developed since the prior CT. 5. Status post partial nephrectomy on the left for resection of the left renal cell carcinoma. No evidence of a new or residual renal cell carcinoma. 6. Aortic atherosclerosis. Electronically Signed   By: Lajean Manes M.D.   On: 11/23/2015 10:57   Dg Abd Portable 1v  Result Date: 11/25/2015 CLINICAL DATA:  55 year old male with history of small-bowel obstruction. Pain in the mid abdominal area. EXAM: PORTABLE ABDOMEN - 1 VIEW COMPARISON:  CT the abdomen and pelvis 11/15/2015. FINDINGS: Gas and stool are seen scattered throughout the colon extending to the level of the distal rectum. No pathologic distension of small bowel is noted.  No gross evidence of pneumoperitoneum. IMPRESSION: 1. Nonobstructive bowel gas pattern. 2. No pneumoperitoneum. Electronically Signed   By: Vinnie Langton M.D.   On: 11/25/2015 13:45    Microbiology: No results found for this or any previous visit (from the past 240 hour(s)).   Labs: Basic Metabolic Panel:  Recent Labs Lab 11/26/15 0542 11/27/15 0433 11/28/15 0534 11/29/15 0602 11/30/15 0543  NA 135 137 135 137 137  K 3.4* 3.6 3.6 3.6 3.7  CL 106 108 106 106 108  CO2 21* 21* 22 23 22   GLUCOSE 152* 111* 114* 119* 112*  BUN 6 5* 7 9 8   CREATININE 0.79 0.75 0.81 0.86 0.88  CALCIUM 8.1* 8.5* 8.9 8.9 8.6*  MG  --   --   --   --  1.8   Liver Function Tests:  Recent Labs Lab 11/26/15 0542 11/27/15 0433 11/28/15 0534 11/29/15 0602 11/30/15 0543  AST 54* 47* 60* 74* 81*  ALT 26 26 31  37 40  ALKPHOS 50 56 57 59 53  BILITOT 1.6* 0.7 0.9 1.0 0.7  PROT 7.0 7.3 7.6 7.5 7.4  ALBUMIN 3.1* 3.1* 3.3* 3.3* 3.3*    Recent Labs Lab 11/26/15 0542 11/27/15 0433 11/28/15 0534 11/29/15 0602  11/30/15 0543  LIPASE 120* 99* 118* 135* 136*   No results for input(s): AMMONIA in the last 168 hours. CBC:  Recent Labs Lab 11/24/15 0552 11/26/15 0542 11/28/15 0534 11/30/15 0543  WBC 5.4 6.2 4.8 4.5  HGB 11.0* 10.2* 10.6* 10.2*  HCT 35.1* 32.4* 33.5* 32.7*  MCV 80.7 80.6 80.0 80.0  PLT 167 170 171 205   Cardiac Enzymes:  Recent Labs Lab 11/25/15 1605  TROPONINI <0.03   BNP: BNP (last 3 results) No results for input(s): BNP in the last 8760 hours.  ProBNP (last 3 results) No results for input(s): PROBNP in the last 8760 hours.  CBG:  Recent Labs Lab 11/25/15 2136  GLUCAP 107*       SignedFlorencia Reasons MD, PhD  [facility] 11/30/2015, 11:36 AM

## 2015-12-01 LAB — HEPATITIS PANEL, ACUTE
HCV Ab: <0.1 s/co ratio (ref 0.0–0.9)
Hep A IgM: NEGATIVE
Hep B C IgM: NEGATIVE
Hepatitis B Surface Ag: NEGATIVE

## 2015-12-08 ENCOUNTER — Ambulatory Visit: Payer: Self-pay | Attending: Internal Medicine | Admitting: Physician Assistant

## 2015-12-08 VITALS — BP 129/89 | HR 78 | Temp 98.2°F | Resp 16 | Wt 209.6 lb

## 2015-12-08 DIAGNOSIS — R748 Abnormal levels of other serum enzymes: Secondary | ICD-10-CM | POA: Insufficient documentation

## 2015-12-08 DIAGNOSIS — K852 Alcohol induced acute pancreatitis without necrosis or infection: Secondary | ICD-10-CM | POA: Insufficient documentation

## 2015-12-08 DIAGNOSIS — F1021 Alcohol dependence, in remission: Secondary | ICD-10-CM

## 2015-12-08 LAB — CBC WITH DIFFERENTIAL/PLATELET
Basophils Absolute: 52 cells/uL (ref 0–200)
Basophils Relative: 1 %
Eosinophils Absolute: 104 cells/uL (ref 15–500)
Eosinophils Relative: 2 %
HCT: 36.3 % — ABNORMAL LOW (ref 38.5–50.0)
Hemoglobin: 11.6 g/dL — ABNORMAL LOW (ref 13.2–17.1)
Lymphocytes Relative: 26 %
Lymphs Abs: 1352 cells/uL (ref 850–3900)
MCH: 25.1 pg — ABNORMAL LOW (ref 27.0–33.0)
MCHC: 32 g/dL (ref 32.0–36.0)
MCV: 78.4 fL — ABNORMAL LOW (ref 80.0–100.0)
MPV: 11.8 fL (ref 7.5–12.5)
Monocytes Absolute: 416 cells/uL (ref 200–950)
Monocytes Relative: 8 %
Neutro Abs: 3276 cells/uL (ref 1500–7800)
Neutrophils Relative %: 63 %
Platelets: 319 10*3/uL (ref 140–400)
RBC: 4.63 MIL/uL (ref 4.20–5.80)
RDW: 16.9 % — ABNORMAL HIGH (ref 11.0–15.0)
WBC: 5.2 10*3/uL (ref 3.8–10.8)

## 2015-12-08 MED ORDER — TRAMADOL HCL 50 MG PO TABS: 50.0000 mg | ORAL_TABLET | Freq: Three times a day (TID) | ORAL | 0 refills | Status: DC | PRN

## 2015-12-08 MED FILL — traMADol HCL 50 MG TABS: 50 | 10 days supply | Qty: 30 | Fill #0

## 2015-12-08 NOTE — Progress Notes (Signed)
James [name], is a 55 y.o. male  [id number]  [id number]  DOB - 09/06/1960  Subjective:  Chief Complaint and HPI: James [name] is a 55 y.o. male here today to establish care and for a follow up visit after being hospitalized 11/23/2015-11/30/2015 for alcohol-induced pancreatitis. He continues to have abdominal pain.  There has been minimal improvement of the pain since hospital discharge.  He is still taking Metronidazole and Cipro.  He has not been adhering to a low-fat diet but he is abstaining from alcohol. His appetite is good.  No f/c.  No N/V/D.    He was also having L leg pain while hospitalized and found to have an acute superficial saphenous vein thrombus which is being treated with 325mg  aspirin and compression hose.  Repeat doppler is only recommended if his pain doesn't improve.  He says his pain in his L leg is resolved.   PMH remarkable for renal carcinoma tumor resected 01/2015 and no need for chemo/radiation.[name]   ED/Hospital notes reviewed.    ROS:   Constitutional:  No f/c, No night sweats, No unexplained weight loss. EENT:  No vision changes, No blurry vision, No hearing changes. No mouth, throat, or ear problems.  Respiratory: No cough, No SOB Cardiac: No CP, no palpitations GI:  +abd pain, No N/V/D. GU: No Urinary s/sx Musculoskeletal: No joint pain Neuro: No headache, no dizziness, no motor weakness.  Skin: No rash Endocrine:  No polydipsia. No polyuria.  Psych: Denies SI/HI  No problems updated.  ALLERGIES: Allergies  Allergen Reactions  . Sulfa Antibiotics Swelling    PAST MEDICAL HISTORY: Past Medical History:  Diagnosis Date  . Arthritis    right knee right wrist   . Chronic kidney disease   . GERD (gastroesophageal reflux disease)   . Hypertension    pt states was treated with medication back in 2000; currently on no medication   . Left renal mass   . Melanoma ([place]) dx'd 10yrs ago age 55   rt lat chest wall--surg only  . Renal  cancer ([place]) dx'd 01/2015   left nephrectomy  . Wears glasses     MEDICATIONS AT HOME: Prior to Admission medications   Medication Sig Start Date End Date Taking? Authorizing Provider  aspirin 325 MG tablet Take 1 tablet (325 mg total) by mouth daily. 11/30/15 12/30/15  Florencia Reasons, MD  ciprofloxacin (CIPRO) 500 MG tablet Take 1 tablet (500 mg total) by mouth 2 (two) times daily. 11/30/15 12/10/15  Florencia Reasons, MD  metroNIDAZOLE (FLAGYL) 500 MG tablet Take 1 tablet (500 mg total) by mouth 3 (three) times daily. 11/30/15 12/10/15  Florencia Reasons, MD  Multiple Vitamin (MULTIVITAMIN WITH MINERALS) TABS tablet Take 1 tablet by mouth daily.    Historical Provider, MD  omeprazole (PRILOSEC) 20 MG capsule Take 1 capsule (20 mg total) by mouth 2 (two) times daily before a meal. 11/30/15   Florencia Reasons, MD  oxyCODONE-acetaminophen (ROXICET) 5-325 MG tablet Take 1 tablet by mouth every 8 (eight) hours as needed for severe pain. 11/30/15   Florencia Reasons, MD  saccharomyces boulardii (FLORASTOR) 250 MG capsule Take 1 capsule (250 mg total) by mouth 2 (two) times daily. 11/30/15   Florencia Reasons, MD  traMADol (ULTRAM) 50 MG tablet Take 1 tablet (50 mg total) by mouth every 8 (eight) hours as needed. 12/08/15   Argentina Donovan, PA-C     Objective:  EXAM:   Vitals:   12/08/15 1114  BP: 129/89  Pulse: 78  Resp: 16  Temp: 98.2 F (36.8 C)  TempSrc: Oral  SpO2: 98%  Weight: 209 lb 9.6 oz (95.1 kg)    General appearance : A&OX3. NAD. Non-toxic-appearing HEENT: Atraumatic and Normocephalic.  PERRLA. EOM intact.  TM clear B. Mouth-MMM, post pharynx WNL w/o erythema, No PND. Neck: supple, no JVD. No cervical lymphadenopathy. No thyromegaly Chest/Lungs:  Breathing-non-labored, Good air entry bilaterally, breath sounds normal without rales, rhonchi, or wheezing  CVS: S1 S2 regular, no murmurs, gallops, rubs  Abdomen: Bowel sounds present.  Abdomen appears slightly distended and is firm but not rigid.  I am unable to appreciate  organomegaly.  No rebound tenderness or fluid wave.  He is guarded in allowing me to examine his abdomen secondary to pain.   Extremities: Bilateral Lower Ext shows no edema, both legs are warm to touch with = pulse throughout Neurology:  CN II-XII grossly intact, Non focal.   Psych:  TP linear. J/I WNL. Normal speech. Appropriate eye contact and affect.  Skin:  No Rash  Data Review No results found for: HGBA1C   Assessment & Plan   1. Alcohol-induced acute pancreatitis, unspecified complication status - CBC with Differential/Platelet - Lipase - Comprehensive metabolic panel Use sparingly esp due to chemical dependence properties- traMADol (ULTRAM) 50 MG tablet; Take 1 tablet (50 mg total) by mouth every 8 (eight) hours as needed.  Dispense: 30 tablet; Refill: 0 Importance of compliance with low-fat diet in addition to continued alcohol abstinence discussed.  Defer GI referral to see if he continues to improve with completing his antibiotics in addition to diet and alcohol abstinence adherence.   2. Alcohol dependence in remission [facility]) He has not had alcohol in 15 days and plans to remain abstinent.  I gave him an AA meeting schedule and encouraged him to attend AA meetings.  Congratulated him on success so far.  3. Elevated lipase - Lipase     Patient have been counseled extensively about nutrition and exercise  Return in about 2 weeks (around 12/22/2015) for establish care; recheck pancreatitis and abdominal pain.  The patient was given clear instructions to go to ER or return to medical center if symptoms don't improve, worsen or new problems develop. The patient verbalized understanding. The patient was told to call to get lab results if they haven't heard anything in the next week.     Freeman Caldron, PA-C [facility] and [place], [place]   12/08/2015, 11:55 AMPatient ID: James [name], male   DOB: [birth year], 55 y.o.   MRN:  [id number]

## 2015-12-08 NOTE — Patient Instructions (Signed)

## 2015-12-08 NOTE — Progress Notes (Signed)
Pt is in the office today for Hospital follow up

## 2015-12-09 LAB — COMPREHENSIVE METABOLIC PANEL
ALT: 38 U/L (ref 9–46)
AST: 46 U/L — ABNORMAL HIGH (ref 10–35)
Albumin: 4 g/dL (ref 3.6–5.1)
Alkaline Phosphatase: 60 U/L (ref 40–115)
BUN: 7 mg/dL (ref 7–25)
CO2: 17 mmol/L — ABNORMAL LOW (ref 20–31)
Calcium: 9.8 mg/dL (ref 8.6–10.3)
Chloride: 104 mmol/L (ref 98–110)
Creat: 0.9 mg/dL (ref 0.70–1.33)
Glucose, Bld: 170 mg/dL — ABNORMAL HIGH (ref 65–99)
Potassium: 4.3 mmol/L (ref 3.5–5.3)
Sodium: 137 mmol/L (ref 135–146)
Total Bilirubin: 0.4 mg/dL (ref 0.2–1.2)
Total Protein: 7.9 g/dL (ref 6.1–8.1)

## 2015-12-09 LAB — LIPASE: Lipase: 388 U/L — ABNORMAL HIGH (ref 7–60)

## 2015-12-11 ENCOUNTER — Inpatient Hospital Stay (HOSPITAL_COMMUNITY): Payer: Self-pay

## 2015-12-11 ENCOUNTER — Encounter (HOSPITAL_COMMUNITY): Payer: Self-pay | Admitting: Emergency Medicine

## 2015-12-11 ENCOUNTER — Inpatient Hospital Stay (HOSPITAL_COMMUNITY)
Admission: EM | Admit: 2015-12-11 | Discharge: 2015-12-15 | DRG: 439 | Disposition: A | Discharge status: Home / Self Care | Payer: Self-pay | Attending: Family Medicine | Admitting: Family Medicine

## 2015-12-11 ENCOUNTER — Encounter: Payer: Self-pay | Admitting: Gastroenterology

## 2015-12-11 ENCOUNTER — Other Ambulatory Visit: Payer: Self-pay | Admitting: Physician Assistant

## 2015-12-11 DIAGNOSIS — K852 Alcohol induced acute pancreatitis without necrosis or infection: Secondary | ICD-10-CM

## 2015-12-11 DIAGNOSIS — K219 Gastro-esophageal reflux disease without esophagitis: Secondary | ICD-10-CM | POA: Diagnosis present

## 2015-12-11 DIAGNOSIS — R7989 Other specified abnormal findings of blood chemistry: Secondary | ICD-10-CM

## 2015-12-11 DIAGNOSIS — R945 Abnormal results of liver function studies: Secondary | ICD-10-CM

## 2015-12-11 DIAGNOSIS — Z87891 Personal history of nicotine dependence: Secondary | ICD-10-CM

## 2015-12-11 DIAGNOSIS — F1020 Alcohol dependence, uncomplicated: Secondary | ICD-10-CM | POA: Diagnosis present

## 2015-12-11 DIAGNOSIS — Z85528 Personal history of other malignant neoplasm of kidney: Secondary | ICD-10-CM

## 2015-12-11 DIAGNOSIS — K859 Acute pancreatitis without necrosis or infection, unspecified: Secondary | ICD-10-CM | POA: Diagnosis present

## 2015-12-11 DIAGNOSIS — I10 Essential (primary) hypertension: Secondary | ICD-10-CM | POA: Diagnosis present

## 2015-12-11 DIAGNOSIS — Z801 Family history of malignant neoplasm of trachea, bronchus and lung: Secondary | ICD-10-CM

## 2015-12-11 DIAGNOSIS — Z905 Acquired absence of kidney: Secondary | ICD-10-CM

## 2015-12-11 DIAGNOSIS — F10288 Alcohol dependence with other alcohol-induced disorder: Secondary | ICD-10-CM | POA: Diagnosis present

## 2015-12-11 DIAGNOSIS — M25562 Pain in left knee: Secondary | ICD-10-CM | POA: Diagnosis present

## 2015-12-11 DIAGNOSIS — Z85820 Personal history of malignant melanoma of skin: Secondary | ICD-10-CM

## 2015-12-11 LAB — COMPREHENSIVE METABOLIC PANEL
ALT: 35 U/L (ref 17–63)
AST: 49 U/L — ABNORMAL HIGH (ref 15–41)
Albumin: 4.2 g/dL (ref 3.5–5.0)
Alkaline Phosphatase: 61 U/L (ref 38–126)
Anion gap: 8 (ref 5–15)
BUN: 9 mg/dL (ref 6–20)
CO2: 21 mmol/L — ABNORMAL LOW (ref 22–32)
Calcium: 9.5 mg/dL (ref 8.9–10.3)
Chloride: 107 mmol/L (ref 101–111)
Creatinine, Ser: 0.82 mg/dL (ref 0.61–1.24)
GFR calc Af Amer: >60 mL/min (ref >60)
GFR calc non Af Amer: >60 mL/min (ref >60)
Glucose, Bld: 126 mg/dL — ABNORMAL HIGH (ref 65–99)
Potassium: 3.9 mmol/L (ref 3.5–5.1)
Sodium: 136 mmol/L (ref 135–145)
Total Bilirubin: 0.9 mg/dL (ref 0.3–1.2)
Total Protein: 8.8 g/dL — ABNORMAL HIGH (ref 6.5–8.1)

## 2015-12-11 LAB — LIPASE, BLOOD: Lipase: 305 U/L — ABNORMAL HIGH (ref 11–51)

## 2015-12-11 LAB — URINALYSIS, ROUTINE W REFLEX MICROSCOPIC
Bilirubin Urine: NEGATIVE
Glucose, UA: NEGATIVE mg/dL
Hgb urine dipstick: NEGATIVE
Ketones, ur: NEGATIVE mg/dL
Leukocytes, UA: NEGATIVE
Nitrite: NEGATIVE
Protein, ur: NEGATIVE mg/dL
Specific Gravity, Urine: 1.023 (ref 1.005–1.030)
pH: 7 (ref 5.0–8.0)

## 2015-12-11 LAB — CBC
HCT: 38.5 % — ABNORMAL LOW (ref 39.0–52.0)
Hemoglobin: 12.9 g/dL — ABNORMAL LOW (ref 13.0–17.0)
MCH: 25.4 pg — ABNORMAL LOW (ref 26.0–34.0)
MCHC: 33.5 g/dL (ref 30.0–36.0)
MCV: 75.8 fL — ABNORMAL LOW (ref 78.0–100.0)
Platelets: 357 10*3/uL (ref 150–400)
RBC: 5.08 MIL/uL (ref 4.22–5.81)
RDW: 16.1 % — ABNORMAL HIGH (ref 11.5–15.5)
WBC: 9.1 10*3/uL (ref 4.0–10.5)

## 2015-12-11 LAB — ETHANOL: Alcohol, Ethyl (B): <5 mg/dL (ref <5)

## 2015-12-11 MED ORDER — LORAZEPAM 2 MG/ML IJ SOLN: 1.0000 mg | Freq: Four times a day (QID) | INTRAMUSCULAR | Status: AC | PRN

## 2015-12-11 MED ORDER — PANTOPRAZOLE SODIUM 40 MG PO TBEC
40.0000 mg | DELAYED_RELEASE_TABLET | Freq: Every day | ORAL | Status: DC
Start: 2015-12-12 — End: 2015-12-15
  Administered 2015-12-12 – 2015-12-15 (×4): 40 mg via ORAL
  Filled 2015-12-11 (×5): qty 1

## 2015-12-11 MED ORDER — ACETAMINOPHEN 650 MG RE SUPP: 650.0000 mg | Freq: Four times a day (QID) | RECTAL | Status: DC | PRN

## 2015-12-11 MED ORDER — SODIUM CHLORIDE 0.9 % IV BOLUS (SEPSIS)
1000.0000 mL | Freq: Once | INTRAVENOUS | Status: AC
  Administered 2015-12-11: 1000 mL via INTRAVENOUS

## 2015-12-11 MED ORDER — POLYETHYLENE GLYCOL 3350 17 G PO PACK: 17.0000 g | PACK | Freq: Every day | ORAL | Status: DC | PRN

## 2015-12-11 MED ORDER — MORPHINE SULFATE (PF) 2 MG/ML IV SOLN
2.0000 mg | INTRAVENOUS | Status: DC | PRN
  Administered 2015-12-11 – 2015-12-13 (×10): 2 mg via INTRAVENOUS
  Filled 2015-12-11 (×10): qty 1

## 2015-12-11 MED ORDER — FENTANYL CITRATE (PF) 100 MCG/2ML IJ SOLN
50.0000 ug | Freq: Once | INTRAMUSCULAR | Status: AC
  Administered 2015-12-11: 50 ug via INTRAVENOUS
  Filled 2015-12-11: qty 2

## 2015-12-11 MED ORDER — OXYCODONE HCL 5 MG PO TABS
5.0000 mg | ORAL_TABLET | ORAL | Status: DC | PRN
  Administered 2015-12-11 – 2015-12-15 (×21): 10 mg via ORAL
  Filled 2015-12-11 (×21): qty 2

## 2015-12-11 MED ORDER — ONDANSETRON HCL 4 MG/2ML IJ SOLN: 4.0000 mg | Freq: Four times a day (QID) | INTRAMUSCULAR | Status: DC | PRN

## 2015-12-11 MED ORDER — ACETAMINOPHEN 325 MG PO TABS: 650.0000 mg | ORAL_TABLET | Freq: Four times a day (QID) | ORAL | Status: DC | PRN

## 2015-12-11 MED ORDER — SODIUM CHLORIDE 0.9 % IV SOLN
INTRAVENOUS | Status: AC
  Administered 2015-12-11 – 2015-12-12 (×2): via INTRAVENOUS

## 2015-12-11 MED ORDER — LORAZEPAM 1 MG PO TABS: 1.0000 mg | ORAL_TABLET | Freq: Four times a day (QID) | ORAL | Status: AC | PRN

## 2015-12-11 MED ORDER — FOLIC ACID 1 MG PO TABS
1.0000 mg | ORAL_TABLET | Freq: Every day | ORAL | Status: DC
  Administered 2015-12-11 – 2015-12-15 (×5): 1 mg via ORAL
  Filled 2015-12-11 (×5): qty 1

## 2015-12-11 MED ORDER — ASPIRIN 325 MG PO TABS
325.0000 mg | ORAL_TABLET | Freq: Every day | ORAL | Status: DC
  Administered 2015-12-12 – 2015-12-15 (×4): 325 mg via ORAL
  Filled 2015-12-11 (×4): qty 1

## 2015-12-11 MED ORDER — ADULT MULTIVITAMIN W/MINERALS CH
1.0000 | ORAL_TABLET | Freq: Every day | ORAL | Status: DC
  Administered 2015-12-11 – 2015-12-15 (×5): 1 via ORAL
  Filled 2015-12-11 (×5): qty 1

## 2015-12-11 MED ORDER — SACCHAROMYCES BOULARDII 250 MG PO CAPS
250.0000 mg | ORAL_CAPSULE | Freq: Two times a day (BID) | ORAL | Status: DC
  Administered 2015-12-11 – 2015-12-15 (×8): 250 mg via ORAL
  Filled 2015-12-11 (×8): qty 1

## 2015-12-11 MED ORDER — ONDANSETRON HCL 4 MG PO TABS: 4.0000 mg | ORAL_TABLET | Freq: Four times a day (QID) | ORAL | Status: DC | PRN

## 2015-12-11 MED ORDER — ENOXAPARIN SODIUM 60 MG/0.6ML ~~LOC~~ SOLN
50.0000 mg | SUBCUTANEOUS | Status: DC
  Administered 2015-12-11: 50 mg via SUBCUTANEOUS
  Filled 2015-12-11: qty 0.6

## 2015-12-11 MED ORDER — VITAMIN B-1 100 MG PO TABS
100.0000 mg | ORAL_TABLET | Freq: Every day | ORAL | Status: DC
  Administered 2015-12-11 – 2015-12-15 (×5): 100 mg via ORAL
  Filled 2015-12-11 (×5): qty 1

## 2015-12-11 MED ORDER — BISACODYL 10 MG RE SUPP: 10.0000 mg | Freq: Every day | RECTAL | Status: DC | PRN

## 2015-12-11 MED ORDER — THIAMINE HCL 100 MG/ML IJ SOLN
100.0000 mg | Freq: Every day | INTRAMUSCULAR | Status: DC
  Filled 2015-12-11: qty 2

## 2015-12-11 NOTE — ED Notes (Signed)
Pt cannot use restroom at this time, aware urine specimen is needed.  

## 2015-12-11 NOTE — ED Notes (Signed)
Urine sample collected in triage 

## 2015-12-11 NOTE — ED Triage Notes (Signed)
Pt reports mid abd pain. No nausea nor emesis. sts was seen for similar symptoms. Dx pancreatitis. No relief with home meds. denies alcohol intake . No urinary symptoms. Alert and oriented x 4.

## 2015-12-11 NOTE — ED Notes (Signed)
Pt still unable to provide urine sample at this time. Pt stated "I have been trying". Urinal at bedside

## 2015-12-11 NOTE — H&P (Signed)
JADEIN THALMAN [id number] DOB: 1960/06/03 DOA: 12/11/2015     PCP: Have not seen PCP in 3-4 years.  Outpatient Specialists:none Patient coming from:    home Lives  With family    Chief Complaint: Epigastric pain  HPI: JOZSEF BROUSE is a 55 y.o. male with medical history significant of alcohol abuse, alcohol induced pancreatitis, renal cell carcinoma, GERD,CKD,   HTN, L renal mass s/p resection     Presented with epigastric pain starting after patient had some bacon as well as  alcohol 4 days ago. This is associated some nausea and vomiting. On October 5 patient was discharged from hospital after admission for pancreatitis secondary to alcohol abuse   Patient had recent CT of abdomen during his prior admission that showed pancreatitis but no recurrence of cancer. Patient apparently was discharged to home after his own request. At home he was not able to abstain from alcohol or maintained diet. Patient reports significant epigastric pain similar to prior episode of pancreatitis not improved home medication denies any urinary complaints no fevers or chills. Denies diarrhea and no dysuria no headaches no chest pain or shortness of breath. Reports last EtOH 4 days. No Hx of withdrawal.   Regarding pertinent Chronic problems: left renal mass as was resected in May of this year During admission he was found to have acute left superficial saphenous vein thrombosis DVT rheumatology has been consulted history of compression hasn't followed dose aspirin  IN ER:  Temp (24hrs), Avg:98.5 F (36.9 C), Min:98.2 F (36.8 C), Max:98.7 F (37.1 C)   Alcohol level less than 5  lipase 305 from discharge lipase at 136 Cr 0.82 WBC 9.1 Hg 12.9 Following Medications were ordered in ER: Medications  sodium chloride 0.9 % bolus 1,000 mL (not administered)  sodium chloride 0.9 % bolus 1,000 mL (1,000 mLs Intravenous New Bag/Given 12/11/15 1847)  fentaNYL (SUBLIMAZE) injection 50 mcg (50 mcg  Intravenous Given 12/11/15 1840)      Hospitalist was called for admission for pancreatitis  Review of Systems:    Pertinent positives include:  abdominal pain, nausea,   Constitutional:  No weight loss, night sweats, Fevers, chills, fatigue, weight loss  HEENT:  No headaches, Difficulty swallowing,Tooth/dental problems,Sore throat,  No sneezing, itching, ear ache, nasal congestion, post nasal drip,  Cardio-vascular:  No chest pain, Orthopnea, PND, anasarca, dizziness, palpitations.no Bilateral lower extremity swelling  GI:  No heartburn, indigestion,vomiting, diarrhea, change in bowel habits, loss of appetite, melena, blood in stool, hematemesis Resp:  no shortness of breath at rest. No dyspnea on exertion, No excess mucus, no productive cough, No non-productive cough, No coughing up of blood.No change in color of mucus.No wheezing. Skin:  no rash or lesions. No jaundice GU:  no dysuria, change in color of urine, no urgency or frequency. No straining to urinate.  No flank pain.  Musculoskeletal:  No joint pain or no joint swelling. No decreased range of motion. No back pain.  Psych:  No change in mood or affect. No depression or anxiety. No memory loss.  Neuro: no localizing neurological complaints, no tingling, no weakness, no double vision, no gait abnormality, no slurred speech, no confusion  As per HPI otherwise 10 point review of systems negative.   Past Medical History: Past Medical History:  Diagnosis Date  . Arthritis    right knee right wrist   . Chronic kidney disease   . GERD (gastroesophageal reflux disease)   . Hypertension    pt states was  treated with medication back in 2000; currently on no medication   . Left renal mass   . Melanoma ([place]) dx'd 48yrs ago age 34   rt lat chest wall--surg only  . Renal cancer ([place]) dx'd 01/2015   left nephrectomy  . Wears glasses    Past Surgical History:  Procedure Laterality Date  . ABDOMINAL SURGERY     remove  tumor cyst   . CYSTOSCOPY WITH RETROGRADE PYELOGRAM, URETEROSCOPY AND STENT PLACEMENT Left 07/19/2015   Procedure: LEFT RETROGRADE PYELOGRAM, LEFT URETEROSCOPY AND LEFT URETERAL STENT PLACEMENT;  Surgeon: Ardis Hughs, MD;  Location: [facility];  Service: Urology;  Laterality: Left;  . drained cyst      left kidney 10 months ago   . lymph nodes removed      right armpit secondary to melanoma   . NEPHRECTOMY Left 02/15/2015   Procedure: LEFT OPEN PARTIAL NEPHRECTOMY;  Surgeon: Ardis Hughs, MD;  Location: [facility];  Service: Urology;  Laterality: Left;  . right armpit surgery     for melanoma     Social History:  Ambulatory independently      reports that he quit smoking about 7 months ago. His smoking use included Cigarettes. He has a 5.00 pack-year smoking history. He has never used smokeless tobacco. He reports that he drinks alcohol. He reports that he does not use drugs.  Allergies:   Allergies  Allergen Reactions  . Sulfa Antibiotics Swelling    Family History:   Family History  Problem Relation Age of Onset  . Lung cancer Mother     Medications: Prior to Admission medications   Medication Sig Start Date End Date Taking? Authorizing Provider  aspirin 325 MG tablet Take 1 tablet (325 mg total) by mouth daily. 11/30/15 12/30/15 Yes Florencia Reasons, MD  Multiple Vitamin (MULTIVITAMIN WITH MINERALS) TABS tablet Take 1 tablet by mouth daily.   Yes Historical Provider, MD  omeprazole (PRILOSEC) 20 MG capsule Take 1 capsule (20 mg total) by mouth 2 (two) times daily before a meal. 11/30/15  Yes Florencia Reasons, MD  saccharomyces boulardii (FLORASTOR) 250 MG capsule Take 1 capsule (250 mg total) by mouth 2 (two) times daily. 11/30/15  Yes Florencia Reasons, MD  oxyCODONE-acetaminophen (ROXICET) 5-325 MG tablet Take 1 tablet by mouth every 8 (eight) hours as needed for severe pain. Patient not taking: Reported on 12/11/2015 11/30/15   Florencia Reasons, MD  traMADol (ULTRAM) 50 MG tablet Take 1 tablet (50 mg  total) by mouth every 8 (eight) hours as needed. Patient not taking: Reported on 12/11/2015 12/08/15   Argentina Donovan, PA-C    Physical Exam: Patient Vitals for the past 24 hrs:  BP Temp Temp src Pulse Resp SpO2 Height Weight  12/11/15 1830 (!) 151/101 - - 72 - 96 % - -  12/11/15 1818 (!) 153/104 - - 73 18 97 % - -  12/11/15 1648 150/100 98.7 F (37.1 C) Oral 74 20 100 % - -  12/11/15 1405 145/97 98.2 F (36.8 C) - 79 16 100 % - -  12/11/15 1404 - - - - - - 6\' 2"  (1.88 m) 92.1 kg (203 lb 1 oz)    1. General:  in No Acute distress 2. Psychological: Alert and  Oriented 3. Head/ENT:   Dry Mucous Membranes                          Head Non traumatic, neck supple  Poor Dentition 4. SKIN:    decreased Skin turgor,  Skin clean Dry and intact no rash 5. Heart: Regular rate and rhythm no  Murmur, Rub or gallop 6. Lungs: Clear to auscultation bilaterally, no wheezes or crackles   7. Abdomen: Soft,  epigastric tenderness, Non distended 8. Lower extremities: no clubbing, cyanosis, or edema 9. Neurologically Grossly intact, moving all 4 extremities equally  10. MSK: Normal range of motion   body mass index is 26.07 kg/m.  Labs on Admission:   Labs on Admission: I have personally reviewed following labs and imaging studies  CBC:  Recent Labs Lab 12/08/15 1142 12/11/15 1432  WBC 5.2 9.1  NEUTROABS 3,276  --   HGB 11.6* 12.9*  HCT 36.3* 38.5*  MCV 78.4* 75.8*  PLT 319 [id number]   Basic Metabolic Panel:  Recent Labs Lab 12/08/15 1142 12/11/15 1432  NA 137 136  K 4.3 3.9  CL 104 107  CO2 17* 21*  GLUCOSE 170* 126*  BUN 7 9  CREATININE 0.90 0.82  CALCIUM 9.8 9.5   GFR: Estimated Creatinine Clearance: 118.3 mL/min (by C-G formula based on SCr of 0.82 mg/dL). Liver Function Tests:  Recent Labs Lab 12/08/15 1142 12/11/15 1432  AST 46* 49*  ALT 38 35  ALKPHOS 60 61  BILITOT 0.4 0.9  PROT 7.9 8.8*  ALBUMIN 4.0 4.2    Recent Labs Lab  12/08/15 1142 12/11/15 1432  LIPASE 388* 305*   No results for input(s): AMMONIA in the last 168 hours. Coagulation Profile: No results for input(s): INR, PROTIME in the last 168 hours. Cardiac Enzymes: No results for input(s): CKTOTAL, CKMB, CKMBINDEX, TROPONINI in the last 168 hours. BNP (last 3 results) No results for input(s): PROBNP in the last 8760 hours. HbA1C: No results for input(s): HGBA1C in the last 72 hours. CBG: No results for input(s): GLUCAP in the last 168 hours. Lipid Profile: No results for input(s): CHOL, HDL, LDLCALC, TRIG, CHOLHDL, LDLDIRECT in the last 72 hours. Thyroid Function Tests: No results for input(s): TSH, T4TOTAL, FREET4, T3FREE, THYROIDAB in the last 72 hours. Anemia Panel: No results for input(s): VITAMINB12, FOLATE, FERRITIN, TIBC, IRON, RETICCTPCT in the last 72 hours. Urine analysis:    Component Value Date/Time   COLORURINE AMBER (A) 12/11/2015 1420   APPEARANCEUR CLEAR 12/11/2015 1420   LABSPEC 1.023 12/11/2015 1420   PHURINE 7.0 12/11/2015 1420   GLUCOSEU NEGATIVE 12/11/2015 1420   HGBUR NEGATIVE 12/11/2015 1420   BILIRUBINUR NEGATIVE 12/11/2015 1420   KETONESUR NEGATIVE 12/11/2015 1420   PROTEINUR NEGATIVE 12/11/2015 1420   NITRITE NEGATIVE 12/11/2015 1420   LEUKOCYTESUR NEGATIVE 12/11/2015 1420   Sepsis Labs: @LABRCNTIP (procalcitonin:4,lacticidven:4) )No results found for this or any previous visit (from the past 240 hour(s)).     UA  no evidence of UTI    No results found for: HGBA1C  Estimated Creatinine Clearance: 118.3 mL/min (by C-G formula based on SCr of 0.82 mg/dL).  BNP (last 3 results) No results for input(s): PROBNP in the last 8760 hours.   ECG REPORT Not obtained  Filed Weights   12/11/15 1404  Weight: 92.1 kg (203 lb 1 oz)     Cultures:    Component Value Date/Time   SDES BLOOD RIGHT ANTECUBITAL 07/14/2012 1123   SPECREQUEST BOTTLES DRAWN AEROBIC AND ANAEROBIC 3CC 07/14/2012 1123   CULT NO GROWTH  5 DAYS 07/14/2012 1123   REPTSTATUS 07/20/2012 FINAL 07/14/2012 1123     Radiological Exams on Admission: No results found.  Chart has been  reviewed    Assessment/Plan  55 y.o. male with medical history significant of alcohol abuse, alcohol induced pancreatitis, renal cell carcinoma, GERD,CKD,   HTN, L renal mass s/p resection  Being admitted for recurrent alcohol-induced pancreatitis  Present on Admission: . Alcohol dependence ([place])- CIWA protocol . Alcohol induced acute pancreatitisKeep nothing by mouth, rehydrate, obtain right upper quadrant ultrasound to avoid a 28, common and gallbladder disease supportive management and pain control. Obtain lipid panel. May benefit from GI consult if no improvement strongly encouraged to discontinue alcohol abuse  History of superficial venous thrombosis continue aspirin   Other plan as per orders.  DVT prophylaxis:  lovenox    Code Status:  FULL CODE  as per patient    Family Communication:   Family not  at  Bedside    Disposition Plan:   To home once workup is complete and patient is stable     Consults called: nonoe    Admission status:   inpatient      Level of care       medical floor          I have spent a total of 56 min on this admission    DOUTOVA,ANASTASSIA 12/11/2015, 8:05 PM    [facility]  Pager [phone]   after 2 AM please page floor coverage PA If 7AM-7PM, please contact the day team taking care of the patient  [email or web address]  Password [email or web address]

## 2015-12-11 NOTE — ED Provider Notes (Signed)
[facility] DEPT Provider Note   CSN: [id number] Arrival date & time: 12/11/15  1355     History   Chief Complaint Chief Complaint  Patient presents with  . Abdominal Pain    HPI James [name] is a 55 y.o. male.  The history is provided by the patient.  Abdominal Pain   This is a chronic problem. Episode frequency: intermittent. The problem has been rapidly worsening (since yesterday). The pain is associated with alcohol use and eating. The pain is located in the epigastric region. The pain is moderate. Associated symptoms include nausea. Pertinent negatives include fever, diarrhea, vomiting, dysuria, hematuria, headaches and arthralgias. The symptoms are aggravated by eating. Nothing relieves the symptoms. Past medical history comments: pancreatitis.   Recently admitted for Pancreatitis. Similar presentation.   Past Medical History:  Diagnosis Date  . Arthritis    right knee right wrist   . Chronic kidney disease   . GERD (gastroesophageal reflux disease)   . Hypertension    pt states was treated with medication back in 2000; currently on no medication   . Left renal mass   . Melanoma ([place]) dx'd 50yrs ago age 73   rt lat chest wall--surg only  . Renal cancer ([place]) dx'd 01/2015   left nephrectomy  . Wears glasses     Patient Active Problem List   Diagnosis Date Noted  . Thrombosis of saphenous vein, left 11/28/2015  . Alcohol induced acute pancreatitis 11/23/2015  . Elevated lipase 11/23/2015  . Pancreatitis 11/23/2015  . Acute pancreatitis 11/23/2015  . Renal mass 02/15/2015  . Renal cell carcinoma ([place]) 02/15/2015  . Cellulitis and abscess of leg, left 07/14/2012  . GERD (gastroesophageal reflux disease) 07/14/2012  . Alcohol dependence ([place]) 05/25/2012    Past Surgical History:  Procedure Laterality Date  . ABDOMINAL SURGERY     remove tumor cyst   . CYSTOSCOPY WITH RETROGRADE PYELOGRAM, URETEROSCOPY AND STENT PLACEMENT Left 07/19/2015   Procedure:  LEFT RETROGRADE PYELOGRAM, LEFT URETEROSCOPY AND LEFT URETERAL STENT PLACEMENT;  Surgeon: Ardis Hughs, MD;  Location: [facility];  Service: Urology;  Laterality: Left;  . drained cyst      left kidney 10 months ago   . lymph nodes removed      right armpit secondary to melanoma   . NEPHRECTOMY Left 02/15/2015   Procedure: LEFT OPEN PARTIAL NEPHRECTOMY;  Surgeon: Ardis Hughs, MD;  Location: [facility];  Service: Urology;  Laterality: Left;  . right armpit surgery     for melanoma       Home Medications    Prior to Admission medications   Medication Sig Start Date End Date Taking? Authorizing Provider  aspirin 325 MG tablet Take 1 tablet (325 mg total) by mouth daily. 11/30/15 12/30/15 Yes Florencia Reasons, MD  Multiple Vitamin (MULTIVITAMIN WITH MINERALS) TABS tablet Take 1 tablet by mouth daily.   Yes Historical Provider, MD  omeprazole (PRILOSEC) 20 MG capsule Take 1 capsule (20 mg total) by mouth 2 (two) times daily before a meal. 11/30/15  Yes Florencia Reasons, MD  saccharomyces boulardii (FLORASTOR) 250 MG capsule Take 1 capsule (250 mg total) by mouth 2 (two) times daily. 11/30/15  Yes Florencia Reasons, MD  oxyCODONE-acetaminophen (ROXICET) 5-325 MG tablet Take 1 tablet by mouth every 8 (eight) hours as needed for severe pain. Patient not taking: Reported on 12/11/2015 11/30/15   Florencia Reasons, MD  traMADol (ULTRAM) 50 MG tablet Take 1 tablet (50 mg total) by mouth every 8 (eight)  hours as needed. Patient not taking: Reported on 12/11/2015 12/08/15   Argentina Donovan, PA-C    Family History Family History  Problem Relation Age of Onset  . Lung cancer Mother     Social History Social History  Substance Use Topics  . Smoking status: Former Smoker    Packs/day: 0.25    Years: 20.00    Types: Cigarettes    Quit date: 04/16/2015  . Smokeless tobacco: Never Used  . Alcohol use Yes     Comment: 1-2 a day     Allergies   Sulfa antibiotics   Review of Systems Review of Systems  Constitutional:  Negative for chills and fever.  HENT: Negative for ear pain and sore throat.   Eyes: Negative for pain and visual disturbance.  Respiratory: Negative for cough and shortness of breath.   Cardiovascular: Negative for chest pain and palpitations.  Gastrointestinal: Positive for abdominal pain and nausea. Negative for diarrhea and vomiting.  Genitourinary: Negative for dysuria and hematuria.  Musculoskeletal: Negative for arthralgias and back pain.  Skin: Negative for color change and rash.  Neurological: Negative for seizures, syncope and headaches.  All other systems reviewed and are negative.    Physical Exam Updated Vital Signs BP 150/100 (BP Location: Left Arm)   Pulse 74   Temp 98.7 F (37.1 C) (Oral)   Resp 20   Ht 6\' 2"  (1.88 m)   Wt 203 lb 1 oz (92.1 kg)   SpO2 100%   BMI 26.07 kg/m   Physical Exam  Constitutional: He is oriented to person, place, and time. He appears well-developed and well-nourished. No distress.  HENT:  Head: Normocephalic and atraumatic.  Nose: Nose normal.  Eyes: Conjunctivae and EOM are normal. Pupils are equal, round, and reactive to light. Right eye exhibits no discharge. Left eye exhibits no discharge. No scleral icterus.  Neck: Normal range of motion. Neck supple.  Cardiovascular: Normal rate and regular rhythm.  Exam reveals no gallop and no friction rub.   No murmur heard. Pulmonary/Chest: Effort normal and breath sounds normal. No stridor. No respiratory distress. He has no rales.  Abdominal: Soft. He exhibits no distension. There is tenderness in the epigastric area.  Musculoskeletal: He exhibits no edema or tenderness.  Neurological: He is alert and oriented to person, place, and time.  Skin: Skin is warm and dry. No rash noted. He is not diaphoretic. No erythema.  Psychiatric: He has a normal mood and affect.  Vitals reviewed.    ED Treatments / Results  Labs (all labs ordered are listed, but only abnormal results are  displayed) Labs Reviewed  LIPASE, BLOOD - Abnormal; Notable for the following:       Result Value   Lipase 305 (*)    All other components within normal limits  COMPREHENSIVE METABOLIC PANEL - Abnormal; Notable for the following:    CO2 21 (*)    Glucose, Bld 126 (*)    Total Protein 8.8 (*)    AST 49 (*)    All other components within normal limits  CBC - Abnormal; Notable for the following:    Hemoglobin 12.9 (*)    HCT 38.5 (*)    MCV 75.8 (*)    MCH 25.4 (*)    RDW 16.1 (*)    All other components within normal limits  URINALYSIS, ROUTINE W REFLEX MICROSCOPIC (NOT AT [facility])    EKG  EKG Interpretation None       Radiology No results found.  Procedures Procedures (including critical care time)  Medications Ordered in ED Medications  sodium chloride 0.9 % bolus 1,000 mL (1,000 mLs Intravenous New Bag/Given 12/11/15 2016)  sodium chloride 0.9 % bolus 1,000 mL (0 mLs Intravenous Stopped 12/11/15 2000)  fentaNYL (SUBLIMAZE) injection 50 mcg (50 mcg Intravenous Given 12/11/15 1840)     Initial Impression / Assessment and Plan / ED Course  I have reviewed the triage vital signs and the nursing notes.  Pertinent labs & imaging results that were available during my care of the patient were reviewed by me and considered in my medical decision making (see chart for details).  Clinical Course    Acute on chronic pancreatitis exacerbation. Low suspicion for small bowel obstruction, acute cholecystitis, appendicitis, diverticulitis. Patient provided with IV pain medicine and fluids. Required admission for continued management.  Appreciate hospitalist admission  Final Clinical Impressions(s) / ED Diagnoses   Final diagnoses:  Alcohol-induced acute pancreatitis without infection or necrosis    Disposition: Admit  Condition: Stable     Fatima Blank, MD 12/12/15 [phone]

## 2015-12-12 ENCOUNTER — Telehealth: Payer: Self-pay

## 2015-12-12 LAB — COMPREHENSIVE METABOLIC PANEL
ALT: 28 U/L (ref 17–63)
AST: 37 U/L (ref 15–41)
Albumin: 3.6 g/dL (ref 3.5–5.0)
Alkaline Phosphatase: 55 U/L (ref 38–126)
Anion gap: 6 (ref 5–15)
BUN: 8 mg/dL (ref 6–20)
CO2: 21 mmol/L — ABNORMAL LOW (ref 22–32)
Calcium: 8.6 mg/dL — ABNORMAL LOW (ref 8.9–10.3)
Chloride: 109 mmol/L (ref 101–111)
Creatinine, Ser: 0.79 mg/dL (ref 0.61–1.24)
GFR calc Af Amer: >60 mL/min (ref >60)
GFR calc non Af Amer: >60 mL/min (ref >60)
Glucose, Bld: 115 mg/dL — ABNORMAL HIGH (ref 65–99)
Potassium: 3.7 mmol/L (ref 3.5–5.1)
Sodium: 136 mmol/L (ref 135–145)
Total Bilirubin: 0.8 mg/dL (ref 0.3–1.2)
Total Protein: 7.7 g/dL (ref 6.5–8.1)

## 2015-12-12 LAB — RAPID URINE DRUG SCREEN, HOSP PERFORMED
Amphetamines: NOT DETECTED
Barbiturates: NOT DETECTED
Benzodiazepines: NOT DETECTED
Cocaine: NOT DETECTED
Opiates: NOT DETECTED
Tetrahydrocannabinol: POSITIVE — AB

## 2015-12-12 LAB — LIPID PANEL
Cholesterol: 137 mg/dL (ref 0–200)
HDL: 21 mg/dL — ABNORMAL LOW (ref >40)
LDL Cholesterol: 86 mg/dL (ref 0–99)
Total CHOL/HDL Ratio: 6.5 RATIO
Triglycerides: 152 mg/dL — ABNORMAL HIGH (ref <150)
VLDL: 30 mg/dL (ref 0–40)

## 2015-12-12 LAB — CBC
HCT: 36.1 % — ABNORMAL LOW (ref 39.0–52.0)
Hemoglobin: 11.3 g/dL — ABNORMAL LOW (ref 13.0–17.0)
MCH: 24.7 pg — ABNORMAL LOW (ref 26.0–34.0)
MCHC: 31.3 g/dL (ref 30.0–36.0)
MCV: 78.8 fL (ref 78.0–100.0)
Platelets: 276 10*3/uL (ref 150–400)
RBC: 4.58 MIL/uL (ref 4.22–5.81)
RDW: 16.4 % — ABNORMAL HIGH (ref 11.5–15.5)
WBC: 6.1 10*3/uL (ref 4.0–10.5)

## 2015-12-12 LAB — MAGNESIUM: Magnesium: 1.8 mg/dL (ref 1.7–2.4)

## 2015-12-12 LAB — PHOSPHORUS: Phosphorus: 2.9 mg/dL (ref 2.5–4.6)

## 2015-12-12 LAB — TSH: TSH: 1.569 u[IU]/mL (ref 0.350–4.500)

## 2015-12-12 LAB — HIV ANTIBODY (ROUTINE TESTING W REFLEX): HIV Screen 4th Generation wRfx: NONREACTIVE

## 2015-12-12 LAB — LIPASE, BLOOD: Lipase: 209 U/L — ABNORMAL HIGH (ref 11–51)

## 2015-12-12 MED ORDER — ENOXAPARIN SODIUM 40 MG/0.4ML ~~LOC~~ SOLN
40.0000 mg | SUBCUTANEOUS | Status: DC
  Administered 2015-12-12 – 2015-12-14 (×3): 40 mg via SUBCUTANEOUS
  Filled 2015-12-12 (×3): qty 0.4

## 2015-12-12 MED ORDER — SODIUM CHLORIDE 0.9 % IV SOLN
INTRAVENOUS | Status: DC
  Administered 2015-12-12 – 2015-12-15 (×4): via INTRAVENOUS

## 2015-12-12 NOTE — Progress Notes (Signed)
PROGRESS NOTE    James [name]  [id number] DOB: 02/06/1961 DOA: 12/11/2015 PCP: No PCP Per Patient    Brief Narrative: James [name] is a 55 y.o. male with medical history significant of alcohol abuse, alcohol induced pancreatitis, renal cell carcinoma, GERD,CKD,   HTN, L renal mass s/p resection presented with epigastric pain admitted for recurrent pancreatitis.   Assessment & Plan:   Active Problems:   Alcohol dependence ([place])   Alcohol induced acute pancreatitis   Pancreatitis   Hypertension: controlled.   Alcohol abuse:  On CIWA .   Alcohol induced pancreatitis: Improving lipase , symptomatic management with anti emetics and pain control.  NPO today, when pain controlled, can start diet.    UDS positive for tetrahydrocannabinol.        DVT prophylaxis: (lovenox Code Status: (Full/) Family Communication: none at bedside.  Disposition Plan: pending resolution of the abd pain.    Consultants:   None.    Procedures: none    Antimicrobials: none.    Subjective: Pain better, requesting for ice chips.   Objective: Vitals:   12/11/15 2032 12/11/15 2052 12/12/15 0120 12/12/15 0452  BP: 106/87 (!) 145/95 (!) 127/93 134/90  Pulse: 73 76 69 60  Resp: 18 16 16    Temp:  97.7 F (36.5 C) 97.7 F (36.5 C) 98.3 F (36.8 C)  TempSrc:  Oral Oral Oral  SpO2: 98% 100% 100% 100%  Weight:      Height:        Intake/Output Summary (Last 24 hours) at 12/12/15 1317 Last data filed at 12/12/15 0626  Gross per 24 hour  Intake           1102.5 ml  Output              600 ml  Net            502.5 ml   Filed Weights   12/11/15 1404  Weight: 92.1 kg (203 lb 1 oz)    Examination:  General exam: Appears calm and comfortable  Respiratory system: Clear to auscultation. Respiratory effort normal. Cardiovascular system: S1 & S2 heard, RRR. No JVD, murmurs, rubs, gallops or clicks. No pedal edema. Gastrointestinal system: abdomen is soft, tender in the  epigastric area, bowel sounds heard.  Central nervous system: Alert and oriented. No focal neurological deficits. Extremities: Symmetric 5 x 5 power. Skin: No rashes, lesions or ulcers Psychiatry: Judgement and insight appear normal. Mood & affect appropriate.     Data Reviewed: I have personally reviewed following labs and imaging studies  CBC:  Recent Labs Lab 12/08/15 1142 12/11/15 1432 12/12/15 0403  WBC 5.2 9.1 6.1  NEUTROABS 3,276  --   --   HGB 11.6* 12.9* 11.3*  HCT 36.3* 38.5* 36.1*  MCV 78.4* 75.8* 78.8  PLT 319 357 [id number]   Basic Metabolic Panel:  Recent Labs Lab 12/08/15 1142 12/11/15 1432 12/12/15 0403  NA 137 136 136  K 4.3 3.9 3.7  CL 104 107 109  CO2 17* 21* 21*  GLUCOSE 170* 126* 115*  BUN 7 9 8   CREATININE 0.90 0.82 0.79  CALCIUM 9.8 9.5 8.6*  MG  --   --  1.8  PHOS  --   --  2.9   GFR: Estimated Creatinine Clearance: 121.3 mL/min (by C-G formula based on SCr of 0.79 mg/dL). Liver Function Tests:  Recent Labs Lab 12/08/15 1142 12/11/15 1432 12/12/15 0403  AST 46* 49* 37  ALT 38 35 28  ALKPHOS  60 61 55  BILITOT 0.4 0.9 0.8  PROT 7.9 8.8* 7.7  ALBUMIN 4.0 4.2 3.6    Recent Labs Lab 12/08/15 1142 12/11/15 1432 12/12/15 0403  LIPASE 388* 305* 209*   No results for input(s): AMMONIA in the last 168 hours. Coagulation Profile: No results for input(s): INR, PROTIME in the last 168 hours. Cardiac Enzymes: No results for input(s): CKTOTAL, CKMB, CKMBINDEX, TROPONINI in the last 168 hours. BNP (last 3 results) No results for input(s): PROBNP in the last 8760 hours. HbA1C: No results for input(s): HGBA1C in the last 72 hours. CBG: No results for input(s): GLUCAP in the last 168 hours. Lipid Profile:  Recent Labs  12/12/15 0403  CHOL 137  HDL 21*  LDLCALC 86  TRIG 152*  CHOLHDL 6.5   Thyroid Function Tests:  Recent Labs  12/12/15 0403  TSH 1.569   Anemia Panel: No results for input(s): VITAMINB12, FOLATE, FERRITIN,  TIBC, IRON, RETICCTPCT in the last 72 hours. Sepsis Labs: No results for input(s): PROCALCITON, LATICACIDVEN in the last 168 hours.  No results found for this or any previous visit (from the past 240 hour(s)).       Radiology Studies: US Abdomen Complete  Result Date: 12/12/2015 CLINICAL DATA:  55 year old male with elevated LFTs. EXAM: ABDOMEN ULTRASOUND COMPLETE COMPARISON:  CT of the abdomen pelvis dated 11/29/2015 and 11/23/2015 FINDINGS: Gallbladder: No gallstones or wall thickening visualized. No sonographic Murphy sign noted by sonographer. Common bile duct: Diameter: 5 mm Liver: Mild diffuse increased hepatic echogenicity likely fatty infiltration. The liver is otherwise unremarkable. IVC: No abnormality visualized. Pancreas: The pancreas is not well visualized and obscured by bowel gas. Spleen: Top-normal spleen size. Right Kidney: Length: 13 cm. Echogenicity within normal limits. No mass or hydronephrosis visualized. Left Kidney: Length: 12 cm. There is suboptimal visualization of the left kidney due to overlying bowel gas. No hydronephrosis or echogenic stone identified. Abdominal aorta: Not well visualized and obscured by bowel gas. Other findings: None. IMPRESSION: Mild fatty infiltration of the liver otherwise unremarkable abdominal ultrasound. Electronically Signed   By: Anner Crete M.D.   On: 12/12/2015 04:45        Scheduled Meds: . aspirin  325 mg Oral Daily  . enoxaparin (LOVENOX) injection  40 mg Subcutaneous Q24H  . folic acid  1 mg Oral Daily  . multivitamin with minerals  1 tablet Oral Daily  . pantoprazole  40 mg Oral Daily  . saccharomyces boulardii  250 mg Oral BID  . thiamine  100 mg Oral Daily   Or  . thiamine  100 mg Intravenous Daily   Continuous Infusions:    LOS: 1 day    Time spent: 28 minutes.     Hosie Poisson, MD [facility] Pager [phone]  If 7PM-7AM, please contact night-coverage [email or web address] Password  [email or web address] 12/12/2015, 1:17 PM

## 2015-12-12 NOTE — Telephone Encounter (Signed)
Contacted pt to go over lab results pt is aware of results and pt states he is in the hospital right now

## 2015-12-13 LAB — HEMOGLOBIN A1C
Hgb A1c MFr Bld: 6 % — ABNORMAL HIGH (ref 4.8–5.6)
Mean Plasma Glucose: 126 mg/dL

## 2015-12-13 MED ORDER — MORPHINE SULFATE (PF) 2 MG/ML IV SOLN
2.0000 mg | INTRAVENOUS | Status: DC | PRN
  Administered 2015-12-13 – 2015-12-14 (×7): 2 mg via INTRAVENOUS
  Filled 2015-12-13 (×7): qty 1

## 2015-12-13 NOTE — Care Management Note (Signed)
Case Management Note  Patient Details  Name: James [name] MRN: [id number] Date of Birth: [birth year]  Subjective/Objective:        55 yo admitted with alcohol dependence/pancreatitis.            Action/Plan: From home with mother. Chart reviewed and CM following for DC needs. Pt has been seen at the [facility] recently to establish PCP there.   Expected Discharge Date:   (unknown)               Expected Discharge Plan:  Home/Self Care  In-House Referral:     Discharge planning Services  CM Consult  Post Acute Care Choice:    Choice offered to:     DME Arranged:    DME Agency:     HH Arranged:    HH Agency:     Status of Service:  In process, will continue to follow  If discussed at Long Length of Stay Meetings, dates discussed:    Additional CommentsLynnell Catalan, RN 12/13/2015, 1:32 PM  [phone]

## 2015-12-13 NOTE — Progress Notes (Signed)
PROGRESS NOTE    ADEDAMOLA GASPARYAN  [id number] DOB: 06-Jul-1960 DOA: 12/11/2015 PCP: No PCP Per Patient    Brief Narrative: James [name] is a 55 y.o. male with a history of alcohol abuse, alcoholic pancreatitis, renal cell CA s/p left renal mass resection, and CKD who presented 10/16 with recurrent epigastric pain found to have recurrent pancreatitis.   Assessment & Plan:   Active Problems:   Alcohol dependence ([place])   Alcohol induced acute pancreatitis   Pancreatitis  Alcohol-induced acute pancreatitis: - Lipase improving, abd pain improving; pt would like to start clear liquids. Will start this and take away IV narcotics per our discussion.  - ADDNEDUM: Pt requested addition of IV morphine. Per our discussion, will return to NPO and reinstate IV morphine prn. Advance diet as tolerated tomorrow.  - Continue analgesics, antiemetics prn  Hypertension:  - controlled  Alcohol abuse: On CIWA: scores zero - Cessation counseling including role of this in recurrent pancreatitis was reviewed this AM  Substance abuse:  UDS +THC. - Brief cessation counseling  DVT prophylaxis: lovenox Code Status: Full Family Communication: none at bedside. Declined offer to call this AM Disposition Plan: pending resolution of acute pancreatitis  Consultants:  - None   Procedures: - None  Antimicrobials:  - None  Subjective: Abd pain improved, desires to restart diet. No N/V/D  Objective: Vitals:   12/12/15 2115 12/13/15 0331 12/13/15 0618 12/13/15 0947  BP: 129/89 (!) 137/92 (!) 128/93 122/89  Pulse: 67 64 66 92  Resp: 18 16 16 16   Temp: 98.2 F (36.8 C) 97.5 F (36.4 C) 97.6 F (36.4 C) 97.5 F (36.4 C)  TempSrc: Oral Oral Oral Oral  SpO2: 99% 100% 100% 100%  Weight:      Height:        Intake/Output Summary (Last 24 hours) at 12/13/15 1429 Last data filed at 12/13/15 0600  Gross per 24 hour  Intake             1200 ml  Output                0 ml  Net              1200 ml   Filed Weights   12/11/15 1404  Weight: 92.1 kg (203 lb 1 oz)    Examination:  General exam: Appears calm and comfortable  Respiratory system: Clear to auscultation. Respiratory effort normal. Cardiovascular system: S1 & S2 heard, RRR. No JVD, murmurs, rubs, gallops or clicks. No pedal edema. Gastrointestinal system: abdomen is soft, mildly tender in the epigastric area without rebound, guarding or distention, +BS Central nervous system: Alert and oriented. No focal neurological deficits. Extremities: Symmetric 5 x 5 power. Skin: No rashes, lesions or ulcers Psychiatry: Judgement and insight appear impaired. Mood & affect appropriate.   Data Reviewed: I have personally reviewed following labs and imaging studies  CBC:  Recent Labs Lab 12/08/15 1142 12/11/15 1432 12/12/15 0403  WBC 5.2 9.1 6.1  NEUTROABS 3,276  --   --   HGB 11.6* 12.9* 11.3*  HCT 36.3* 38.5* 36.1*  MCV 78.4* 75.8* 78.8  PLT 319 357 [id number]   Basic Metabolic Panel:  Recent Labs Lab 12/08/15 1142 12/11/15 1432 12/12/15 0403  NA 137 136 136  K 4.3 3.9 3.7  CL 104 107 109  CO2 17* 21* 21*  GLUCOSE 170* 126* 115*  BUN 7 9 8   CREATININE 0.90 0.82 0.79  CALCIUM 9.8 9.5 8.6*  MG  --   --  1.8  PHOS  --   --  2.9   GFR: Estimated Creatinine Clearance: 121.3 mL/min (by C-G formula based on SCr of 0.79 mg/dL). Liver Function Tests:  Recent Labs Lab 12/08/15 1142 12/11/15 1432 12/12/15 0403  AST 46* 49* 37  ALT 38 35 28  ALKPHOS 60 61 55  BILITOT 0.4 0.9 0.8  PROT 7.9 8.8* 7.7  ALBUMIN 4.0 4.2 3.6    Recent Labs Lab 12/08/15 1142 12/11/15 1432 12/12/15 0403  LIPASE 388* 305* 209*   No results for input(s): AMMONIA in the last 168 hours. Coagulation Profile: No results for input(s): INR, PROTIME in the last 168 hours. Cardiac Enzymes: No results for input(s): CKTOTAL, CKMB, CKMBINDEX, TROPONINI in the last 168 hours. BNP (last 3 results) No results for input(s): PROBNP in the  last 8760 hours. HbA1C:  Recent Labs  12/12/15 0403  HGBA1C 6.0*   CBG: No results for input(s): GLUCAP in the last 168 hours. Lipid Profile:  Recent Labs  12/12/15 0403  CHOL 137  HDL 21*  LDLCALC 86  TRIG 152*  CHOLHDL 6.5   Thyroid Function Tests:  Recent Labs  12/12/15 0403  TSH 1.569   Anemia Panel: No results for input(s): VITAMINB12, FOLATE, FERRITIN, TIBC, IRON, RETICCTPCT in the last 72 hours. Sepsis Labs: No results for input(s): PROCALCITON, LATICACIDVEN in the last 168 hours.  No results found for this or any previous visit (from the past 240 hour(s)).       Radiology Studies: US Abdomen Complete  Result Date: 12/12/2015 CLINICAL DATA:  55 year old male with elevated LFTs. EXAM: ABDOMEN ULTRASOUND COMPLETE COMPARISON:  CT of the abdomen pelvis dated 11/29/2015 and 11/23/2015 FINDINGS: Gallbladder: No gallstones or wall thickening visualized. No sonographic Murphy sign noted by sonographer. Common bile duct: Diameter: 5 mm Liver: Mild diffuse increased hepatic echogenicity likely fatty infiltration. The liver is otherwise unremarkable. IVC: No abnormality visualized. Pancreas: The pancreas is not well visualized and obscured by bowel gas. Spleen: Top-normal spleen size. Right Kidney: Length: 13 cm. Echogenicity within normal limits. No mass or hydronephrosis visualized. Left Kidney: Length: 12 cm. There is suboptimal visualization of the left kidney due to overlying bowel gas. No hydronephrosis or echogenic stone identified. Abdominal aorta: Not well visualized and obscured by bowel gas. Other findings: None. IMPRESSION: Mild fatty infiltration of the liver otherwise unremarkable abdominal ultrasound. Electronically Signed   By: Anner Crete M.D.   On: 12/12/2015 04:45        Scheduled Meds: . aspirin  325 mg Oral Daily  . enoxaparin (LOVENOX) injection  40 mg Subcutaneous Q24H  . folic acid  1 mg Oral Daily  . multivitamin with minerals  1 tablet  Oral Daily  . pantoprazole  40 mg Oral Daily  . saccharomyces boulardii  250 mg Oral BID  . thiamine  100 mg Oral Daily   Or  . thiamine  100 mg Intravenous Daily   Continuous Infusions: . sodium chloride 100 mL/hr at 12/13/15 0732    LOS: 2 days   Time spent: 25 minutes.   Vance Gather, MD [facility] Pager [phone]   If 7PM-7AM, please contact night-coverage [email or web address] Password [email or web address] 12/13/2015, 2:29 PM

## 2015-12-14 LAB — COMPREHENSIVE METABOLIC PANEL
ALT: 26 U/L (ref 17–63)
AST: 42 U/L — ABNORMAL HIGH (ref 15–41)
Albumin: 3.8 g/dL (ref 3.5–5.0)
Alkaline Phosphatase: 52 U/L (ref 38–126)
Anion gap: 9 (ref 5–15)
BUN: 6 mg/dL (ref 6–20)
CO2: 19 mmol/L — ABNORMAL LOW (ref 22–32)
Calcium: 9.1 mg/dL (ref 8.9–10.3)
Chloride: 108 mmol/L (ref 101–111)
Creatinine, Ser: 0.82 mg/dL (ref 0.61–1.24)
GFR calc Af Amer: >60 mL/min (ref >60)
GFR calc non Af Amer: >60 mL/min (ref >60)
Glucose, Bld: 94 mg/dL (ref 65–99)
Potassium: 3.9 mmol/L (ref 3.5–5.1)
Sodium: 136 mmol/L (ref 135–145)
Total Bilirubin: 1.1 mg/dL (ref 0.3–1.2)
Total Protein: 7.7 g/dL (ref 6.5–8.1)

## 2015-12-14 LAB — CBC
HCT: 37 % — ABNORMAL LOW (ref 39.0–52.0)
Hemoglobin: 11.6 g/dL — ABNORMAL LOW (ref 13.0–17.0)
MCH: 24.6 pg — ABNORMAL LOW (ref 26.0–34.0)
MCHC: 31.4 g/dL (ref 30.0–36.0)
MCV: 78.6 fL (ref 78.0–100.0)
Platelets: 245 10*3/uL (ref 150–400)
RBC: 4.71 MIL/uL (ref 4.22–5.81)
RDW: 16.2 % — ABNORMAL HIGH (ref 11.5–15.5)
WBC: 4.3 10*3/uL (ref 4.0–10.5)

## 2015-12-14 LAB — LIPASE, BLOOD: Lipase: 151 U/L — ABNORMAL HIGH (ref 11–51)

## 2015-12-14 LAB — C-REACTIVE PROTEIN: CRP: <0.8 mg/dL (ref <1.0)

## 2015-12-14 MED ORDER — INFLUENZA VAC SPLIT QUAD 0.5 ML IM SUSY
0.5000 mL | PREFILLED_SYRINGE | INTRAMUSCULAR | Status: AC
  Administered 2015-12-15: 0.5 mL via INTRAMUSCULAR
  Filled 2015-12-14: qty 0.5

## 2015-12-14 MED ORDER — PNEUMOCOCCAL VAC POLYVALENT 25 MCG/0.5ML IJ INJ
0.5000 mL | INJECTION | INTRAMUSCULAR | Status: AC
  Administered 2015-12-15: 0.5 mL via INTRAMUSCULAR
  Filled 2015-12-14 (×2): qty 0.5

## 2015-12-14 NOTE — Progress Notes (Addendum)
PROGRESS NOTE    James [name]  [id number] DOB: [birth year] DOA: 12/11/2015 PCP: No PCP Per Patient    Brief Narrative: James [name] is a 55 y.o. male with a history of alcohol abuse, alcoholic pancreatitis, renal cell CA s/p left renal mass resection, and CKD who presented 10/16 with recurrent epigastric pain found to have recurrent pancreatitis.   Assessment & Plan:   Active Problems:   Alcohol dependence ([place])   Alcohol induced acute pancreatitis   Pancreatitis  Alcohol-induced acute pancreatitis: - Lipase improving, abd pain improving. Given recent bounce back from pancreatitis, will continue slow trajectory with NPO today and consider po challenge with clear liquids 10/20. This was discussed with the pt at length, particularly the necessity to have pain guide therapy. If pain continues to require IV pain medication, pancreatitis is still too severe for po. Once starting po, will no longer have IV pain medications.  - Continue analgesics, antiemetics prn - CRP this AM was <0.8 indicating no significant inflammation. Wil no longer trend lipase/CRP.   Hypertension:  - controlled  Alcohol abuse: On CIWA: scores zero - Cessation counseling including role of this in recurrent pancreatitis was reviewed this AM  Substance abuse:  UDS +THC. - Brief cessation counseling  DVT prophylaxis: lovenox Code Status: Full Family Communication: none at bedside. Declined offer to call. Disposition Plan: pending resolution of acute pancreatitis  Consultants:  - None   Procedures: - None  Antimicrobials:  - None  Subjective: Abd pain improved. This AM morphine took his pain to a zero, something new for him. No N/V/D  Objective: Vitals:   12/13/15 1754 12/13/15 2123 12/14/15 0549 12/14/15 1352  BP: (!) 144/95 (!) 138/93 135/88 137/86  Pulse: 67 65 66 86  Resp: 18 18 17 20   Temp: 98 F (36.7 C)  97.6 F (36.4 C) 97.9 F (36.6 C)  TempSrc: Oral  Oral Oral  SpO2: 98%  100% 100% 99%  Weight:      Height:        Intake/Output Summary (Last 24 hours) at 12/14/15 1555 Last data filed at 12/13/15 1653  Gross per 24 hour  Intake          1088.33 ml  Output                0 ml  Net          1088.33 ml   Filed Weights   12/11/15 1404  Weight: 92.1 kg (203 lb 1 oz)    Examination:  General exam: Appears calm and comfortable  Respiratory system: Clear to auscultation. Respiratory effort normal. Cardiovascular system: S1 & S2 heard, RRR. No JVD, murmurs, rubs, gallops or clicks. No pedal edema. Gastrointestinal system: abdomen is soft, mildly tender in the epigastric area without rebound, guarding or distention, +BS Central nervous system: Alert and oriented. No focal neurological deficits. Extremities: Symmetric 5 x 5 power. Skin: No rashes, lesions or ulcers Psychiatry: Judgement and insight appear impaired. Mood & affect appropriate.   Data Reviewed: I have personally reviewed following labs and imaging studies  CBC:  Recent Labs Lab 12/08/15 1142 12/11/15 1432 12/12/15 0403 12/14/15 0837  WBC 5.2 9.1 6.1 4.3  NEUTROABS 3,276  --   --   --   HGB 11.6* 12.9* 11.3* 11.6*  HCT 36.3* 38.5* 36.1* 37.0*  MCV 78.4* 75.8* 78.8 78.6  PLT 319 357 276 [id number]   Basic Metabolic Panel:  Recent Labs Lab 12/08/15 1142 12/11/15 1432 12/12/15 0403 12/14/15  0837  NA 137 136 136 136  K 4.3 3.9 3.7 3.9  CL 104 107 109 108  CO2 17* 21* 21* 19*  GLUCOSE 170* 126* 115* 94  BUN 7 9 8 6   CREATININE 0.90 0.82 0.79 0.82  CALCIUM 9.8 9.5 8.6* 9.1  MG  --   --  1.8  --   PHOS  --   --  2.9  --    GFR: Estimated Creatinine Clearance: 118.3 mL/min (by C-G formula based on SCr of 0.82 mg/dL). Liver Function Tests:  Recent Labs Lab 12/08/15 1142 12/11/15 1432 12/12/15 0403 12/14/15 0837  AST 46* 49* 37 42*  ALT 38 35 28 26  ALKPHOS 60 61 55 52  BILITOT 0.4 0.9 0.8 1.1  PROT 7.9 8.8* 7.7 7.7  ALBUMIN 4.0 4.2 3.6 3.8    Recent Labs Lab  12/08/15 1142 12/11/15 1432 12/12/15 0403 12/14/15 0837  LIPASE 388* 305* 209* 151*   No results for input(s): AMMONIA in the last 168 hours. Coagulation Profile: No results for input(s): INR, PROTIME in the last 168 hours. Cardiac Enzymes: No results for input(s): CKTOTAL, CKMB, CKMBINDEX, TROPONINI in the last 168 hours. BNP (last 3 results) No results for input(s): PROBNP in the last 8760 hours. HbA1C:  Recent Labs  12/12/15 0403  HGBA1C 6.0*   CBG: No results for input(s): GLUCAP in the last 168 hours. Lipid Profile:  Recent Labs  12/12/15 0403  CHOL 137  HDL 21*  LDLCALC 86  TRIG 152*  CHOLHDL 6.5   Thyroid Function Tests:  Recent Labs  12/12/15 0403  TSH 1.569   Anemia Panel: No results for input(s): VITAMINB12, FOLATE, FERRITIN, TIBC, IRON, RETICCTPCT in the last 72 hours. Sepsis Labs: No results for input(s): PROCALCITON, LATICACIDVEN in the last 168 hours.  No results found for this or any previous visit (from the past 240 hour(s)).       Radiology Studies: No results found.      Scheduled Meds: . aspirin  325 mg Oral Daily  . enoxaparin (LOVENOX) injection  40 mg Subcutaneous Q24H  . folic acid  1 mg Oral Daily  . [START ON 12/15/2015] Influenza vac split quadrivalent PF  0.5 mL Intramuscular Tomorrow-1000  . multivitamin with minerals  1 tablet Oral Daily  . pantoprazole  40 mg Oral Daily  . [START ON 12/15/2015] pneumococcal 23 valent vaccine  0.5 mL Intramuscular Tomorrow-1000  . saccharomyces boulardii  250 mg Oral BID  . thiamine  100 mg Oral Daily   Or  . thiamine  100 mg Intravenous Daily   Continuous Infusions: . sodium chloride 100 mL/hr at 12/13/15 1850    LOS: 3 days   Time spent: 15 minutes.   Vance Gather, MD [facility] Pager [phone]   If 7PM-7AM, please contact night-coverage [email or web address] Password [email or web address] 12/14/2015, 3:55 PM

## 2015-12-15 LAB — COMPREHENSIVE METABOLIC PANEL
ALT: 24 U/L (ref 17–63)
AST: 34 U/L (ref 15–41)
Albumin: 3.6 g/dL (ref 3.5–5.0)
Alkaline Phosphatase: 55 U/L (ref 38–126)
Anion gap: 6 (ref 5–15)
BUN: <5 mg/dL — ABNORMAL LOW (ref 6–20)
CO2: 23 mmol/L (ref 22–32)
Calcium: 9.1 mg/dL (ref 8.9–10.3)
Chloride: 108 mmol/L (ref 101–111)
Creatinine, Ser: 0.88 mg/dL (ref 0.61–1.24)
GFR calc Af Amer: >60 mL/min (ref >60)
GFR calc non Af Amer: >60 mL/min (ref >60)
Glucose, Bld: 126 mg/dL — ABNORMAL HIGH (ref 65–99)
Potassium: 3.9 mmol/L (ref 3.5–5.1)
Sodium: 137 mmol/L (ref 135–145)
Total Bilirubin: 0.6 mg/dL (ref 0.3–1.2)
Total Protein: 7.6 g/dL (ref 6.5–8.1)

## 2015-12-15 LAB — CBC
HCT: 34.6 % — ABNORMAL LOW (ref 39.0–52.0)
Hemoglobin: 11.1 g/dL — ABNORMAL LOW (ref 13.0–17.0)
MCH: 25.1 pg — ABNORMAL LOW (ref 26.0–34.0)
MCHC: 32.1 g/dL (ref 30.0–36.0)
MCV: 78.3 fL (ref 78.0–100.0)
Platelets: 249 10*3/uL (ref 150–400)
RBC: 4.42 MIL/uL (ref 4.22–5.81)
RDW: 16.1 % — ABNORMAL HIGH (ref 11.5–15.5)
WBC: 4.3 10*3/uL (ref 4.0–10.5)

## 2015-12-15 NOTE — Discharge Summary (Signed)
Physician Discharge Summary  James [name] [id number] DOB: 10/24/1960 DOA: 12/11/2015  PCP: No PCP Per Patient  Admit date: 12/11/2015 Discharge date: 12/15/2015  Admitted From: Home Disposition: Home   Recommendations for Outpatient Follow-up:  1. Follow up with PCP in 1-2 weeks 2. Follow a low fat diet and reinforce need for alcohol cessation.  3. Monitor left knee swelling and pain: waxing/waning course worse only with walking without trauma consistent with degenerative meniscus. No suspicion for gout or DVT with normal exam.  Home Health: None Equipment/Devices: None  Discharge Condition: Stable CODE STATUS: Full Diet recommendation: Low fat  Brief/Interim Summary: James [name] a 55 y.o.malewith a history of alcohol abuse, alcoholic pancreatitis, renal cell CA s/p left renal mass resection, and CKD who presented 10/16 with recurrent epigastric pain found to have recurrent pancreatitis. He was made NPO and diet was advanced as tolerated with improvement in lipase and normalization of blood work. He tolerated a regular diet and desires to go home today. He will be discharged and told to follow up at [facility] (established care just before this admission) for care and orange card.   Discharge Diagnoses:  Active Problems:   Alcohol dependence ([place])   Alcohol induced acute pancreatitis   Pancreatitis  Alcohol-induced acute pancreatitis: - Lipase improved and CRP wnl.  - No abdominal pain with regular diet today - Hospital course was slow due to concern for recurrence of pancreatitis and pt's concern he left previous hospitalization too early, but pt is now completely asymptomatic, requiring no analgesics x 24 hrs.  Hypertension:  - controlled on home meds  Alcohol abuse: On CIWA: scores zero - Cessation counseling including role of this in recurrent pancreatitis was reviewed daily  Substance abuse:  UDS +THC. - Brief cessation counseling  Left knee pain:   With fluctuating swelling, now improved. No erythema/warmth or point tenderness to suggest gout. No deformity. Had SVT Dx at recent hospitalization. Denies symptoms of DVT, has been very ambulatory.  - Continue ASA 325mg  - Follow up with orthopedics as outpatient if continues  Discharge Instructions Discharge Instructions    Discharge instructions    Complete by:  As directed    You were admitted due to recurrent acute pancreatitis - a condition likely due to alcohol use. This has improved nicely and you are tolerating a regular diet without pain.  - Follow up at [facility] - Stop all alcohol - Eat a low fat diet - Call the orthopedist if your knee continues to bother you. You can also follow up at your primary care doctor. - You should return for medical care if your symptoms return.       Medication List    TAKE these medications   aspirin 325 MG tablet Take 1 tablet (325 mg total) by mouth daily.   multivitamin with minerals Tabs tablet Take 1 tablet by mouth daily.   omeprazole 20 MG capsule Commonly known as:  PRILOSEC Take 1 capsule (20 mg total) by mouth 2 (two) times daily before a meal.   oxyCODONE-acetaminophen 5-325 MG tablet Commonly known as:  ROXICET Take 1 tablet by mouth every 8 (eight) hours as needed for severe pain.   saccharomyces boulardii 250 MG capsule Commonly known as:  FLORASTOR Take 1 capsule (250 mg total) by mouth 2 (two) times daily.   traMADol 50 MG tablet Commonly known as:  ULTRAM Take 1 tablet (50 mg total) by mouth every 8 (eight) hours as needed.  Follow-up Information    [place]. Schedule an appointment as soon as possible for a visit today.   Contact information: [address] [phone]         Allergies  Allergen Reactions  . Sulfa Antibiotics Swelling    Consultations:  None  Procedures/Studies: US Abdomen  Complete  Result Date: 12/12/2015 CLINICAL DATA:  55 year old male with elevated LFTs. EXAM: ABDOMEN ULTRASOUND COMPLETE COMPARISON:  CT of the abdomen pelvis dated 11/29/2015 and 11/23/2015 FINDINGS: Gallbladder: No gallstones or wall thickening visualized. No sonographic Murphy sign noted by sonographer. Common bile duct: Diameter: 5 mm Liver: Mild diffuse increased hepatic echogenicity likely fatty infiltration. The liver is otherwise unremarkable. IVC: No abnormality visualized. Pancreas: The pancreas is not well visualized and obscured by bowel gas. Spleen: Top-normal spleen size. Right Kidney: Length: 13 cm. Echogenicity within normal limits. No mass or hydronephrosis visualized. Left Kidney: Length: 12 cm. There is suboptimal visualization of the left kidney due to overlying bowel gas. No hydronephrosis or echogenic stone identified. Abdominal aorta: Not well visualized and obscured by bowel gas. Other findings: None. IMPRESSION: Mild fatty infiltration of the liver otherwise unremarkable abdominal ultrasound. Electronically Signed   By: Anner Crete M.D.   On: 12/12/2015 04:45   Ct Abdomen Pelvis W Contrast  Result Date: 11/29/2015 CLINICAL DATA:  55 year old male with generalized abdominal pain. duodenal and pancreatic inflammation on recent CT Abdomen and Pelvis. EXAM: CT ABDOMEN AND PELVIS WITH CONTRAST TECHNIQUE: Multidetector CT imaging of the abdomen and pelvis was performed using the standard protocol following bolus administration of intravenous contrast. CONTRAST:  170mL ISOVUE-300 IOPAMIDOL (ISOVUE-300) INJECTION 61% COMPARISON:  CT Abdomen and Pelvis 11/23/2015 and earlier. FINDINGS: Lower chest: Trace layering pleural effusions since the prior study. Dependent lower lobe atelectasis is increased. No pericardial effusion. No free air in the upper abdomen. Hepatobiliary: Liver enhancement is stable and within normal limits. Trace pericholecystic fluid, likely reactive secondary to the  pancreatic findings. No biliary ductal enlargement. Pancreas: Inflammatory stranding throughout the lesser sac with peripancreatic stranding and lymphadenopathy. Pancreatic enhancement appears preserved. Secondary involvement of the duodenum, porta hepatis, and anterior para renal spaces. Only trace free fluid. The inflammatory stranding has regressed since 11/23/2015. Spleen: Mild splenomegaly, estimated splenic volume 485 mL (normal splenic volume range 83 - 412 mL). Otherwise negative. Adrenals/Urinary Tract: Negative adrenal glands aside from nearby inflammatory stranding. Postoperative changes to the left kidney. Bilateral renal enhancement and contrast excretion remains normal. Unremarkable urinary bladder. Stomach/Bowel: Negative distal colon aside from retained stool. Mildly redundant splenic flexure. Fluid level in the transverse colon. Mostly decompressed right colon. Sequelae of appendectomy. Negative terminal ileum. fLocculated material in some distal small bowel loops. No dilated small bowel. Decompressed stomach. The duodenum is discussed with the pancreas above. Vascular/Lymphatic: Aortoiliac calcified atherosclerosis noted. Ectatic abdominal aorta and iliac arteries. Major arterial structures are patent. The portal venous system is patent. Peripancreatic and upper abdominal lymphadenopathy including involvement of gastro hepatic ligament nodes, porta hepatis nodes, para renal nodes, and retrocrural nodes. The appearance is stable. Reproductive: Negative. Other: Trace free fluid at the pelvic inlet. No layering pelvic free fluid. Musculoskeletal: No acute osseous abnormality identified. IMPRESSION: 1. Regressed but not resolved inflammatory stranding and lymphadenopathy about the lesser sac compatible with Acute Pancreatitis. Secondary involvement of the duodenum, porta hepatis, and anterior pararenal spaces as before. 2. Trace free fluid. No organized or drainable fluid collection. No pancreatic  necrosis, vascular thrombosis, or other complicating  features. 3. Trace layering pleural effusions are new. Increased lung base atelectasis. 4.  Calcified aortic atherosclerosis. Electronically Signed   By: Genevie Ann M.D.   On: 11/29/2015 15:21   Ct Abdomen Pelvis W Contrast  Result Date: 11/23/2015 CLINICAL DATA:  Low abdominal pain since December. Pain intermittently worse over the last month. Low-grade fever. History of melanoma. History of renal carcinoma. EXAM: CT ABDOMEN AND PELVIS WITH CONTRAST TECHNIQUE: Multidetector CT imaging of the abdomen and pelvis was performed using the standard protocol following bolus administration of intravenous contrast. CONTRAST:  110mL ISOVUE-300 IOPAMIDOL (ISOVUE-300) INJECTION 61% COMPARISON:  MRI, 06/30/2015.  CT, 01/13/2015. FINDINGS: Lower chest: No acute abnormality.  No lung base nodules. Hepatobiliary: No focal liver abnormality is seen. No gallstones, gallbladder wall thickening, or biliary dilatation. Pancreas: There is hazy opacity and fluid attenuation tracking along the pancreas. There are no pancreatic masses. No duct dilation. There is uniform enhancement of the pancreas. Spleen: Mildly enlarged measuring 14.1 x 5.5 x 14.2 cm, previously, 11.8 x 5.1 x 10.1 cm. No splenic mass or focal lesion. Adrenals/Urinary Tract: No adrenal masses. There is scarring from the mid to upper pole the left kidney reflecting a partial nephrectomy for excision of the previously seen large renal mass. No residual or new left renal mass. No stones. No hydronephrosis. Normal appearance of the right kidney. Normal ureters. Bladder is unremarkable. Stomach/Bowel: Stomach is unremarkable. The duodenum shows wall thickening and adjacent fluid attenuation and inflammatory stranding. This extends from the duodenal bulb to the third portion of the duodenum, centered on the second portion of the duodenum. The small bowel is unremarkable. Unremarkable colon. Normal appendix.  Vascular/Lymphatic: Atherosclerotic calcifications are noted along a mildly ectatic abdominal aorta. Prominent to mildly enlarged lymph nodes noted along the gastrohepatic ligament, largest measuring 17 mm in short axis. There are prominent, but not pathologically enlarged, peri celiac lymph nodes. Reproductive: Prostate is unremarkable. Other: Trace ascites is seen adjacent to the liver. No abdominal wall hernia. Musculoskeletal: No acute or significant osseous findings. IMPRESSION: 1. Inflammatory changes are seen extending along the duodenum centered on the second portion. There are also inflammatory changes extending along the margins of the pancreas. This is most likely duodenitis. Pancreatitis is possible. 2. Prominent periceliac and prominent to mildly enlarged gastrohepatic ligament lymph nodes, likely reactive. 3. Trace ascites. 4. Mild splenomegaly which has developed since the prior CT. 5. Status post partial nephrectomy on the left for resection of the left renal cell carcinoma. No evidence of a new or residual renal cell carcinoma. 6. Aortic atherosclerosis. Electronically Signed   By: Lajean Manes M.D.   On: 11/23/2015 10:57   Dg Abd Portable 1v  Result Date: 11/25/2015 CLINICAL DATA:  55 year old male with history of small-bowel obstruction. Pain in the mid abdominal area. EXAM: PORTABLE ABDOMEN - 1 VIEW COMPARISON:  CT the abdomen and pelvis 11/15/2015. FINDINGS: Gas and stool are seen scattered throughout the colon extending to the level of the distal rectum. No pathologic distension of small bowel is noted. No gross evidence of pneumoperitoneum. IMPRESSION: 1. Nonobstructive bowel gas pattern. 2. No pneumoperitoneum. Electronically Signed   By: Vinnie Langton M.D.   On: 11/25/2015 13:45     Subjective: Pt ate fish, mashed potatoes, and collard greens without any abdominal pain, nausea or vomiting. Has taken no pain medications for abdominal pain since starting clear liquids yesterday.  Wants to go home.  Discharge Exam: Vitals:   12/14/15 2223 12/15/15 0540  BP: (!) 132/94 136/87  Pulse: 72 76  Resp: 19 19  Temp: 98 F (36.7 C) 98.4 F (36.9 C)   General: Pt is alert, awake, not in acute distress Cardiovascular: RRR, S1/S2 +, no rubs, no gallops Respiratory: CTA bilaterally, no wheezing, no rhonchi Abdominal: Soft, NT, ND, bowel sounds + Extremities: no edema, no cyanosis  The results of significant diagnostics from this hospitalization (including imaging, microbiology, ancillary and laboratory) are listed below for reference.    Microbiology: No results found for this or any previous visit (from the past 240 hour(s)).   Labs: BNP (last 3 results) No results for input(s): BNP in the last 8760 hours. Basic Metabolic Panel:  Recent Labs Lab 12/11/15 1432 12/12/15 0403 12/14/15 0837 12/15/15 0443  NA 136 136 136 137  K 3.9 3.7 3.9 3.9  CL 107 109 108 108  CO2 21* 21* 19* 23  GLUCOSE 126* 115* 94 126*  BUN 9 8 6  <5*  CREATININE 0.82 0.79 0.82 0.88  CALCIUM 9.5 8.6* 9.1 9.1  MG  --  1.8  --   --   PHOS  --  2.9  --   --    Liver Function Tests:  Recent Labs Lab 12/11/15 1432 12/12/15 0403 12/14/15 0837 12/15/15 0443  AST 49* 37 42* 34  ALT 35 28 26 24   ALKPHOS 61 55 52 55  BILITOT 0.9 0.8 1.1 0.6  PROT 8.8* 7.7 7.7 7.6  ALBUMIN 4.2 3.6 3.8 3.6    Recent Labs Lab 12/11/15 1432 12/12/15 0403 12/14/15 0837  LIPASE 305* 209* 151*   No results for input(s): AMMONIA in the last 168 hours. CBC:  Recent Labs Lab 12/11/15 1432 12/12/15 0403 12/14/15 0837 12/15/15 0443  WBC 9.1 6.1 4.3 4.3  HGB 12.9* 11.3* 11.6* 11.1*  HCT 38.5* 36.1* 37.0* 34.6*  MCV 75.8* 78.8 78.6 78.3  PLT 357 276 245 249   Cardiac Enzymes: No results for input(s): CKTOTAL, CKMB, CKMBINDEX, TROPONINI in the last 168 hours. BNP: Invalid input(s): POCBNP CBG: No results for input(s): GLUCAP in the last 168 hours. D-Dimer No results for input(s): DDIMER in  the last 72 hours. Hgb A1c No results for input(s): HGBA1C in the last 72 hours. Lipid Profile No results for input(s): CHOL, HDL, LDLCALC, TRIG, CHOLHDL, LDLDIRECT in the last 72 hours. Thyroid function studies No results for input(s): TSH, T4TOTAL, T3FREE, THYROIDAB in the last 72 hours.  Invalid input(s): FREET3 Anemia work up No results for input(s): VITAMINB12, FOLATE, FERRITIN, TIBC, IRON, RETICCTPCT in the last 72 hours. Urinalysis    Component Value Date/Time   COLORURINE AMBER (A) 12/11/2015 1420   APPEARANCEUR CLEAR 12/11/2015 1420   LABSPEC 1.023 12/11/2015 1420   PHURINE 7.0 12/11/2015 1420   GLUCOSEU NEGATIVE 12/11/2015 1420   HGBUR NEGATIVE 12/11/2015 1420   BILIRUBINUR NEGATIVE 12/11/2015 1420   KETONESUR NEGATIVE 12/11/2015 1420   PROTEINUR NEGATIVE 12/11/2015 1420   NITRITE NEGATIVE 12/11/2015 1420   LEUKOCYTESUR NEGATIVE 12/11/2015 1420   Sepsis Labs Invalid input(s): PROCALCITONIN,  WBC,  LACTICIDVEN Microbiology No results found for this or any previous visit (from the past 240 hour(s)).  Time coordinating discharge: Over 30 minutes  Vance Gather, MD  [facility] 12/15/2015, 1:02 PM Pager [phone]  If 7PM-7AM, please contact night-coverage [email or web address] Password [email or web address]

## 2015-12-15 NOTE — Discharge Instructions (Signed)
Low-Fat Diet for Pancreatitis or Gallbladder Conditions °A low-fat diet can be helpful if you have pancreatitis or a gallbladder condition. With these conditions, your pancreas and gallbladder have trouble digesting fats. A healthy eating plan with less fat will help rest your pancreas and gallbladder and reduce your symptoms. °WHAT DO I NEED TO KNOW ABOUT THIS DIET? °· Eat a low-fat diet. °¨ Reduce your fat intake to less than 20-30% of your total daily calories. This is less than 50-60 g of fat per day. °¨ Remember that you need some fat in your diet. Ask your dietician what your daily goal should be. °¨ Choose nonfat and low-fat healthy foods. Look for the words "nonfat," "low fat," or "fat free." °¨ As a guide, look on the label and choose foods with less than 3 g of fat per serving. Eat only one serving. °· Avoid alcohol. °· Do not smoke. If you need help quitting, talk with your health care provider. °· Eat small frequent meals instead of three large heavy meals. °WHAT FOODS CAN I EAT? °Grains °Include healthy grains and starches such as potatoes, wheat bread, fiber-rich cereal, and brown rice. Choose whole grain options whenever possible. In adults, whole grains should account for 45-65% of your daily calories.  °Fruits and Vegetables °Eat plenty of fruits and vegetables. Fresh fruits and vegetables add fiber to your diet. °Meats and Other Protein Sources °Eat lean meat such as chicken and pork. Trim any fat off of meat before cooking it. Eggs, fish, and beans are other sources of protein. In adults, these foods should account for 10-35% of your daily calories. °Dairy °Choose low-fat milk and dairy options. Dairy includes fat and protein, as well as calcium.  °Fats and Oils °Limit high-fat foods such as fried foods, sweets, baked goods, sugary drinks.  °Other °Creamy sauces and condiments, such as mayonnaise, can add extra fat. Think about whether or not you need to use them, or use smaller amounts or low fat  options. °WHAT FOODS ARE NOT RECOMMENDED? °· High fat foods, such as: °¨ Baked goods. °¨ Ice cream. °¨ French toast. °¨ Sweet rolls. °¨ Pizza. °¨ Cheese bread. °¨ Foods covered with batter, butter, creamy sauces, or cheese. °¨ Fried foods. °¨ Sugary drinks and desserts. °· Foods that cause gas or bloating °  °This information is not intended to replace advice given to you by your health care provider. Make sure you discuss any questions you have with your health care provider. °  °Document Released: 02/16/2013 Document Reviewed: 02/16/2013 °Elsevier Interactive Patient Education ©2016 [employer] Inc[employer] ° °

## 2015-12-15 NOTE — Progress Notes (Signed)
Nursing Discharge Summary  Patient ID: ABRAHAN PAGETT MRN: [id number] DOB/AGE: October 23, 1960 56 y.o.  Admit date: 12/11/2015 Discharge date: 12/15/2015  Discharged Condition: good  Disposition: 01-Home or Self Care  Follow-up Information    [place]. Schedule an appointment as soon as possible for a visit today.   Contact information: [address] [place] [phone]          Prescriptions Given: No prescriptions given.  Patient follow up and medications discussed.  Patient verbalized understanding without further questions.   Means of Discharge: Patient to ambulate downstairs to be discharged home via private vehicle.    Signed: Buel Ream 12/15/2015, 1:16 PM

## 2015-12-26 ENCOUNTER — Ambulatory Visit: Payer: Self-pay | Attending: Internal Medicine | Admitting: Family Medicine

## 2015-12-26 ENCOUNTER — Encounter: Payer: Self-pay | Admitting: Family Medicine

## 2015-12-26 VITALS — BP 126/87 | HR 83 | Temp 98.1°F | Wt 204.2 lb

## 2015-12-26 DIAGNOSIS — N189 Chronic kidney disease, unspecified: Secondary | ICD-10-CM | POA: Insufficient documentation

## 2015-12-26 DIAGNOSIS — Z79899 Other long term (current) drug therapy: Secondary | ICD-10-CM | POA: Insufficient documentation

## 2015-12-26 DIAGNOSIS — Z85528 Personal history of other malignant neoplasm of kidney: Secondary | ICD-10-CM | POA: Insufficient documentation

## 2015-12-26 DIAGNOSIS — Z882 Allergy status to sulfonamides status: Secondary | ICD-10-CM | POA: Insufficient documentation

## 2015-12-26 DIAGNOSIS — I129 Hypertensive chronic kidney disease with stage 1 through stage 4 chronic kidney disease, or unspecified chronic kidney disease: Secondary | ICD-10-CM | POA: Insufficient documentation

## 2015-12-26 DIAGNOSIS — M19031 Primary osteoarthritis, right wrist: Secondary | ICD-10-CM | POA: Insufficient documentation

## 2015-12-26 DIAGNOSIS — Z905 Acquired absence of kidney: Secondary | ICD-10-CM | POA: Insufficient documentation

## 2015-12-26 DIAGNOSIS — K219 Gastro-esophageal reflux disease without esophagitis: Secondary | ICD-10-CM | POA: Insufficient documentation

## 2015-12-26 DIAGNOSIS — Z85820 Personal history of malignant melanoma of skin: Secondary | ICD-10-CM | POA: Insufficient documentation

## 2015-12-26 DIAGNOSIS — K858 Other acute pancreatitis without necrosis or infection: Secondary | ICD-10-CM | POA: Insufficient documentation

## 2015-12-26 DIAGNOSIS — Z7982 Long term (current) use of aspirin: Secondary | ICD-10-CM | POA: Insufficient documentation

## 2015-12-26 DIAGNOSIS — M1711 Unilateral primary osteoarthritis, right knee: Secondary | ICD-10-CM | POA: Insufficient documentation

## 2015-12-26 MED ORDER — ACETAMINOPHEN-CODEINE #3 300-30 MG PO TABS: 2.0000 | ORAL_TABLET | Freq: Two times a day (BID) | ORAL | 0 refills | Status: DC | PRN

## 2015-12-26 MED ORDER — OMEPRAZOLE 20 MG PO CPDR: 20.0000 mg | DELAYED_RELEASE_CAPSULE | Freq: Two times a day (BID) | ORAL | 0 refills | Status: DC

## 2015-12-26 MED FILL — ACETAMINOPHEN/COD #3 TABLET: 300-30 | 15 days supply | Qty: 60 | Fill #0

## 2015-12-26 MED FILL — OMEPRAZOLE 20 MG CAPSULE DR: 20 | 30 days supply | Qty: 60 | Fill #0

## 2015-12-26 NOTE — Progress Notes (Signed)
Pt says tramadol is not working for the pain Pt has pain in his stomach pain score is a 6

## 2015-12-26 NOTE — Patient Instructions (Signed)

## 2015-12-26 NOTE — Progress Notes (Signed)
Subjective:  Patient ID: James [name], male    DOB: 1960-04-12  Age: 55 y.o. MRN: [id number]  CC: Abdominal Pain   HPI SUHAN DUMAS presents Is a 55 year old male hospitalized for acute alcoholic pancreatitis in month ago and quit alcohol since then. He still continues to have epigastric and left upper quadrant pain which he rates at a 6-7/10 and remains on tramadol which does not help much; last lipase level was 151. Denies nausea, vomiting, constipation or diarrhea and has had no fever.  He previously had left knee pain which has resolved at this time. He has no acute issues today.  Past Medical History:  Diagnosis Date  . Arthritis    right knee right wrist   . Chronic kidney disease   . GERD (gastroesophageal reflux disease)   . Hypertension    pt states was treated with medication back in 2000; currently on no medication   . Left renal mass   . Melanoma ([place]) dx'd 50yrs ago age 60   rt lat chest wall--surg only  . Renal cancer ([place]) dx'd 01/2015   left nephrectomy  . Wears glasses     Past Surgical History:  Procedure Laterality Date  . ABDOMINAL SURGERY     remove tumor cyst   . CYSTOSCOPY WITH RETROGRADE PYELOGRAM, URETEROSCOPY AND STENT PLACEMENT Left 07/19/2015   Procedure: LEFT RETROGRADE PYELOGRAM, LEFT URETEROSCOPY AND LEFT URETERAL STENT PLACEMENT;  Surgeon: Ardis Hughs, MD;  Location: [facility];  Service: Urology;  Laterality: Left;  . drained cyst      left kidney 10 months ago   . lymph nodes removed      right armpit secondary to melanoma   . NEPHRECTOMY Left 02/15/2015   Procedure: LEFT OPEN PARTIAL NEPHRECTOMY;  Surgeon: Ardis Hughs, MD;  Location: [facility];  Service: Urology;  Laterality: Left;  . right armpit surgery     for melanoma    Allergies  Allergen Reactions  . Sulfa Antibiotics Swelling      Outpatient Medications Prior to Visit  Medication Sig Dispense Refill  . aspirin 325 MG tablet Take 1 tablet (325 mg total)  by mouth daily. 30 tablet 0  . Multiple Vitamin (MULTIVITAMIN WITH MINERALS) TABS tablet Take 1 tablet by mouth daily.    . traMADol (ULTRAM) 50 MG tablet Take 1 tablet (50 mg total) by mouth every 8 (eight) hours as needed. 30 tablet 0  . omeprazole (PRILOSEC) 20 MG capsule Take 1 capsule (20 mg total) by mouth 2 (two) times daily before a meal. 60 capsule 0  . saccharomyces boulardii (FLORASTOR) 250 MG capsule Take 1 capsule (250 mg total) by mouth 2 (two) times daily. (Patient not taking: Reported on 12/26/2015) 60 capsule 0  . oxyCODONE-acetaminophen (ROXICET) 5-325 MG tablet Take 1 tablet by mouth every 8 (eight) hours as needed for severe pain. (Patient not taking: Reported on 12/26/2015) 5 tablet 0   No facility-administered medications prior to visit.     ROS Review of Systems  Constitutional: Negative for activity change and appetite change.  HENT: Negative for sinus pressure and sore throat.   Respiratory: Negative for chest tightness, shortness of breath and wheezing.   Cardiovascular: Negative for chest pain and palpitations.  Gastrointestinal: Positive for abdominal pain. Negative for abdominal distention and constipation.  Genitourinary: Negative.   Musculoskeletal: Negative.   Psychiatric/Behavioral: Negative for behavioral problems and dysphoric mood.    Objective:  BP 126/87 (BP Location: Right Arm, Patient Position: Sitting,  Cuff Size: Large)   Pulse 83   Temp 98.1 F (36.7 C) (Oral)   Wt 204 lb 3.2 oz (92.6 kg)   SpO2 99%   BMI 26.22 kg/m   BP/Weight 12/26/2015 12/15/2015 0000000  Systolic BP [id number] -  Diastolic BP 87 87 -  Wt. (Lbs) 204.2 - 203.06  BMI 26.22 - 26.07  Some encounter information is confidential and restricted. Go to Review Flowsheets activity to see all data.      Physical Exam  Constitutional: He is oriented to person, place, and time. He appears well-developed and well-nourished.  Cardiovascular: Normal rate, normal heart sounds and  intact distal pulses.   No murmur heard. Pulmonary/Chest: Effort normal and breath sounds normal. He has no wheezes. He has no rales. He exhibits no tenderness.  Abdominal: Soft. Bowel sounds are normal. He exhibits no distension and no mass. There is tenderness (Epigastric and left upper quadrant tenderness).  Musculoskeletal: Normal range of motion.  Neurological: He is alert and oriented to person, place, and time.     Lipase     Component Value Date/Time   LIPASE 151 (H) 12/14/2015 0837    Lipid Panel     Component Value Date/Time   CHOL 137 12/12/2015 0403   TRIG 152 (H) 12/12/2015 0403   HDL 21 (L) 12/12/2015 0403   CHOLHDL 6.5 12/12/2015 0403   VLDL 30 12/12/2015 0403   LDLCALC 86 12/12/2015 0403    Assessment & Plan:   1. Other acute pancreatitis, unspecified complication status Like he's still slightly elevated Continue omeprazole Commended on alcohol cessation Placed on Tylenol 3-discuss possible sedating and constipating effects Discussed lifestyle modification including low cholesterol diet   Meds ordered this encounter  Medications  . omeprazole (PRILOSEC) 20 MG capsule    Sig: Take 1 capsule (20 mg total) by mouth 2 (two) times daily before a meal.    Dispense:  60 capsule    Refill:  0  . acetaminophen-codeine (TYLENOL #3) 300-30 MG tablet    Sig: Take 2 tablets by mouth every 12 (twelve) hours as needed for moderate pain.    Dispense:  60 tablet    Refill:  0    Follow-up: Return in about 3 months (around 03/27/2016) for follow up on Pancreatitis.   Arnoldo Morale MD

## 2015-12-27 ENCOUNTER — Ambulatory Visit: Payer: Self-pay

## 2015-12-27 ENCOUNTER — Ambulatory Visit: Payer: Self-pay | Admitting: Internal Medicine

## 2016-02-12 ENCOUNTER — Ambulatory Visit: Payer: Self-pay | Admitting: Gastroenterology

## 2016-02-16 ENCOUNTER — Encounter: Payer: Self-pay | Admitting: Physician Assistant

## 2016-03-22 ENCOUNTER — Encounter (HOSPITAL_COMMUNITY): Payer: Self-pay | Admitting: Emergency Medicine

## 2016-03-22 ENCOUNTER — Emergency Department (HOSPITAL_COMMUNITY): Payer: Self-pay

## 2016-03-22 ENCOUNTER — Emergency Department (HOSPITAL_COMMUNITY)
Admission: EM | Admit: 2016-03-22 | Discharge: 2016-03-22 | Disposition: A | Discharge status: Home / Self Care | Payer: Self-pay | Attending: Physician Assistant | Admitting: Physician Assistant

## 2016-03-22 DIAGNOSIS — Y939 Activity, unspecified: Secondary | ICD-10-CM | POA: Insufficient documentation

## 2016-03-22 DIAGNOSIS — Y999 Unspecified external cause status: Secondary | ICD-10-CM | POA: Insufficient documentation

## 2016-03-22 DIAGNOSIS — Z87891 Personal history of nicotine dependence: Secondary | ICD-10-CM | POA: Insufficient documentation

## 2016-03-22 DIAGNOSIS — Z7982 Long term (current) use of aspirin: Secondary | ICD-10-CM | POA: Insufficient documentation

## 2016-03-22 DIAGNOSIS — N189 Chronic kidney disease, unspecified: Secondary | ICD-10-CM | POA: Insufficient documentation

## 2016-03-22 DIAGNOSIS — Y929 Unspecified place or not applicable: Secondary | ICD-10-CM | POA: Insufficient documentation

## 2016-03-22 DIAGNOSIS — W11XXXA Fall on and from ladder, initial encounter: Secondary | ICD-10-CM | POA: Insufficient documentation

## 2016-03-22 DIAGNOSIS — Z79899 Other long term (current) drug therapy: Secondary | ICD-10-CM | POA: Insufficient documentation

## 2016-03-22 DIAGNOSIS — M25569 Pain in unspecified knee: Secondary | ICD-10-CM

## 2016-03-22 DIAGNOSIS — S86811A Strain of other muscle(s) and tendon(s) at lower leg level, right leg, initial encounter: Secondary | ICD-10-CM | POA: Insufficient documentation

## 2016-03-22 DIAGNOSIS — Z85528 Personal history of other malignant neoplasm of kidney: Secondary | ICD-10-CM | POA: Insufficient documentation

## 2016-03-22 DIAGNOSIS — I129 Hypertensive chronic kidney disease with stage 1 through stage 4 chronic kidney disease, or unspecified chronic kidney disease: Secondary | ICD-10-CM | POA: Insufficient documentation

## 2016-03-22 MED ORDER — OXYCODONE-ACETAMINOPHEN 5-325 MG PO TABS: 1.0000 | ORAL_TABLET | Freq: Four times a day (QID) | ORAL | 0 refills | Status: DC | PRN

## 2016-03-22 MED ORDER — HYDROMORPHONE HCL 2 MG/ML IJ SOLN: 0.5000 mg | Freq: Once | INTRAMUSCULAR | Status: DC

## 2016-03-22 MED ORDER — CYCLOBENZAPRINE HCL 10 MG PO TABS: 10.0000 mg | ORAL_TABLET | Freq: Two times a day (BID) | ORAL | 0 refills | Status: DC | PRN

## 2016-03-22 MED ORDER — HYDROMORPHONE HCL 2 MG/ML IJ SOLN
1.0000 mg | Freq: Once | INTRAMUSCULAR | Status: AC
  Administered 2016-03-22: 1 mg via INTRAVENOUS
  Filled 2016-03-22: qty 1

## 2016-03-22 NOTE — ED Notes (Signed)
Pt departed in NAD.  

## 2016-03-22 NOTE — Progress Notes (Signed)
Orthopedic Tech Progress Note Patient Details:  James [name] [birth year] [id number]  Ortho Devices Type of Ortho Device: Crutches, Knee Immobilizer Ortho Device/Splint Location: RLE Ortho Device/Splint Interventions: Ordered, Application   Braulio Bosch 03/22/2016, 6:42 PM

## 2016-03-22 NOTE — ED Provider Notes (Addendum)
[facility] DEPT Provider Note   CSN: [id number] Arrival date & time: 03/22/16  1609     History   Chief Complaint Chief Complaint  Patient presents with  . Fall  . Knee Pain    HPI James [name] is a 56 y.o. male.  HPI   Patient is a 56 year old male who was on a stepladder and fell off noting pain to his right knee. Patient came in with knee draped over 2 pillows and tied tightly with his sheet. Patient says that when he slipped he did hit the back of his head and neck.  Past Medical History:  Diagnosis Date  . Arthritis    right knee right wrist   . Chronic kidney disease   . GERD (gastroesophageal reflux disease)   . Hypertension    pt states was treated with medication back in 2000; currently on no medication   . Left renal mass   . Melanoma ([place]) dx'd 68yrs ago age 75   rt lat chest wall--surg only  . Renal cancer ([place]) dx'd 01/2015   left nephrectomy  . Wears glasses     Patient Active Problem List   Diagnosis Date Noted  . Thrombosis of saphenous vein, left 11/28/2015  . Alcohol induced acute pancreatitis 11/23/2015  . Elevated lipase 11/23/2015  . Pancreatitis 11/23/2015  . Acute pancreatitis 11/23/2015  . Renal mass 02/15/2015  . Renal cell carcinoma ([place]) 02/15/2015  . Cellulitis and abscess of leg, left 07/14/2012  . GERD (gastroesophageal reflux disease) 07/14/2012  . Alcohol dependence ([place]) 05/25/2012    Past Surgical History:  Procedure Laterality Date  . ABDOMINAL SURGERY     remove tumor cyst   . CYSTOSCOPY WITH RETROGRADE PYELOGRAM, URETEROSCOPY AND STENT PLACEMENT Left 07/19/2015   Procedure: LEFT RETROGRADE PYELOGRAM, LEFT URETEROSCOPY AND LEFT URETERAL STENT PLACEMENT;  Surgeon: Ardis Hughs, MD;  Location: [facility];  Service: Urology;  Laterality: Left;  . drained cyst      left kidney 10 months ago   . lymph nodes removed      right armpit secondary to melanoma   . NEPHRECTOMY Left 02/15/2015   Procedure: LEFT OPEN  PARTIAL NEPHRECTOMY;  Surgeon: Ardis Hughs, MD;  Location: [facility];  Service: Urology;  Laterality: Left;  . right armpit surgery     for melanoma       Home Medications    Prior to Admission medications   Medication Sig Start Date End Date Taking? Authorizing Provider  aspirin 325 MG tablet Take 325 mg by mouth daily.   Yes Historical Provider, MD  Multiple Vitamin (MULTIVITAMIN WITH MINERALS) TABS tablet Take 1 tablet by mouth daily.   Yes Historical Provider, MD  omeprazole (PRILOSEC) 20 MG capsule Take 1 capsule (20 mg total) by mouth 2 (two) times daily before a meal. Patient taking differently: Take 20 mg by mouth every other day. Heartburn 12/26/15  Yes Arnoldo Morale, MD  saccharomyces boulardii (FLORASTOR) 250 MG capsule Take 1 capsule (250 mg total) by mouth 2 (two) times daily. 11/30/15  Yes Florencia Reasons, MD  vitamin C (ASCORBIC ACID) 500 MG tablet Take 500 mg by mouth daily.   Yes Historical Provider, MD  acetaminophen-codeine (TYLENOL #3) 300-30 MG tablet Take 2 tablets by mouth every 12 (twelve) hours as needed for moderate pain. Patient not taking: Reported on 03/22/2016 12/26/15   Arnoldo Morale, MD  traMADol (ULTRAM) 50 MG tablet Take 1 tablet (50 mg total) by mouth every 8 (eight) hours as  needed. Patient not taking: Reported on 03/22/2016 12/08/15   Argentina Donovan, PA-C    Family History Family History  Problem Relation Age of Onset  . Lung cancer Mother     Social History Social History  Substance Use Topics  . Smoking status: Former Smoker    Packs/day: 0.25    Years: 20.00    Types: Cigarettes    Quit date: 04/16/2015  . Smokeless tobacco: Never Used  . Alcohol use Yes     Comment: 1-2 a day     Allergies   Sulfa antibiotics   Review of Systems Review of Systems  Constitutional: Negative for activity change.  Respiratory: Negative for shortness of breath.   Musculoskeletal: Negative for back pain.  Neurological: Negative for dizziness.      Physical Exam Updated Vital Signs BP (!) 167/117   Pulse 114   Temp 97.9 F (36.6 C) (Oral)   Resp 16   Ht 6' 1.5" (1.867 m)   Wt 197 lb (89.4 kg)   SpO2 100%   BMI 25.64 kg/m   Physical Exam  Constitutional: He is oriented to person, place, and time. He appears well-nourished.  HENT:  Head: Normocephalic.  Eyes: Conjunctivae are normal.  Neck: Normal range of motion.  tendnerness in bilareatl neck muscules, not on C spine, full ROM.   Cardiovascular: Normal rate and regular rhythm.   Pulmonary/Chest: Effort normal and breath sounds normal.  Musculoskeletal:  Right knee with high riding right patella mildly displaced to lateral side.  Neurological: He is oriented to person, place, and time. No cranial nerve deficit.  + pulses and sensation distally present  Skin: Skin is warm and dry. He is not diaphoretic.  Psychiatric: He has a normal mood and affect. His behavior is normal.     ED Treatments / Results  Labs (all labs ordered are listed, but only abnormal results are displayed) Labs Reviewed - No data to display  EKG  EKG Interpretation None       Radiology Dg Knee Right Port  Result Date: 03/22/2016 CLINICAL DATA:  Acute onset of right knee pain.  Initial encounter. EXAM: PORTABLE RIGHT KNEE - 1-2 VIEW COMPARISON:  Right knee radiographs performed 12/25/2003 FINDINGS: There is no evidence of fracture or dislocation. The joint spaces are preserved. No significant degenerative change is seen; the patellofemoral joint is grossly unremarkable in appearance. Apparent patella alta is noted, new from 2005. Would correlate for any evidence of patellar tendon injury. No significant joint effusion is seen. The visualized soft tissues are normal in appearance. IMPRESSION: 1. No evidence of fracture or dislocation. 2. Apparent patella alta noted, new from 2005. Would correlate for any evidence of patellar tendon injury. Electronically Signed   By: Garald Balding M.D.    On: 03/22/2016 17:56    Procedures Procedures (including critical care time)  Medications Ordered in ED Medications  HYDROmorphone (DILAUDID) injection 0.5 mg (not administered)  HYDROmorphone (DILAUDID) injection 1 mg (1 mg Intravenous Given 03/22/16 1828)     Initial Impression / Assessment and Plan / ED Course  I have reviewed the triage vital signs and the nursing notes.  Pertinent labs & imaging results that were available during my care of the patient were reviewed by me and considered in my medical decision making (see chart for details).     Patient's 56 year old male presenting with injury to the right knee. Patient initially brought in with the knee tied 2 pillows. Knee was initially externally and superiorly  dislocated. Patient initially in collar. However he tore it off.  Patient has only tenderness in the muscle belly in neck. Do not suspect any kind of neck or head injury given the lack of external signs of trauma.  Reduction of dislocation Date/Time: 7:07 PM Performed by: Gardiner Sleeper Authorized by: Gardiner Sleeper Consent: Verbal consent obtained. Risks and benefits: risks, benefits and alternatives were discussed Consent given by: patient Required items: required blood products, implants, devices, and special equipment available Time out: Immediately prior to procedure a "time out" was called to verify the correct patient, procedure, equipment, support staff and site/side marked as required.  Patient sedated: None, pain meds    Vitals: Vital signs were monitored during sedation. Patient tolerance: Patient tolerated the procedure well with no immediate complications. Joint: kneeReduction technique: extension and pressure on knee   Patient's knee was midline after x-ray. Patient stated he is unable to lift lower leg against gravity.  Suspect that this is a patellar tendon rupture. Discussed with Dr. Lorin Mercy. We'll have him follow-up in office on  Monday.   Final Clinical Impressions(s) / ED Diagnoses   Final diagnoses:  Knee pain    New Prescriptions New Prescriptions   No medications on file     Jalayne Ganesh Julio Alm, MD 03/22/16 1907    Russie Gulledge Julio Alm, MD 03/22/16 [id number]

## 2016-03-22 NOTE — ED Triage Notes (Signed)
Per EMS, patient was stepping down off a ladder and missed a step.  Knee was twisted during fall.  Knee cap above where it should be. Denies back pain and did not hit head. NO LOC.  Good distal pulses.  300mg  fentanyl given en route.  20g LFA.

## 2016-03-22 NOTE — ED Notes (Signed)
Deformity/Swelling noted to right knee. Distal pulses good.

## 2016-03-22 NOTE — Discharge Instructions (Signed)
Keep your leg in the knee immobilizer. Follow up with Dr. Lorin Mercy on Monday. Please return if any concerns.

## 2016-03-25 ENCOUNTER — Ambulatory Visit (INDEPENDENT_AMBULATORY_CARE_PROVIDER_SITE_OTHER): Payer: Self-pay | Admitting: Orthopaedic Surgery

## 2016-03-25 ENCOUNTER — Encounter (INDEPENDENT_AMBULATORY_CARE_PROVIDER_SITE_OTHER): Payer: Self-pay | Admitting: Orthopaedic Surgery

## 2016-03-25 VITALS — BP 145/91 | HR 87 | Ht 73.5 in | Wt 197.0 lb

## 2016-03-25 DIAGNOSIS — S86811A Strain of other muscle(s) and tendon(s) at lower leg level, right leg, initial encounter: Secondary | ICD-10-CM

## 2016-03-25 MED ORDER — OXYCODONE-ACETAMINOPHEN 5-325 MG PO TABS: 1.0000 | ORAL_TABLET | Freq: Four times a day (QID) | ORAL | 0 refills | Status: DC | PRN

## 2016-03-25 NOTE — Progress Notes (Signed)
Office Visit Note   Patient: James [name]           Date of Birth: Feb 06, 1961           MRN: [id number] Visit Date: 03/25/2016              Requested by: No referring provider defined for this encounter. PCP: No PCP Per Patient   Assessment & Plan: Visit Diagnoses:  1. Patellar tendon rupture, right, initial encounter     Plan: Patient will need patellar tendon repair. By exam appears that its torn off the inferior pole of the patella. He has high riding patella can extend his knee. We'll set this up as outpatient surgery with possible overnight stay. Procedure discussed and I reviewed with him that he may need a wire placed around the top of the patella and through a drill hole in the tibia to help protect the repair until it has a chance to heal adequately of the tendon quality is not ideal. Risks of bleeding infection discussed. Patient had past history of pancreatitis states he's not drinking currently. Keep his knee immobilizer on at all times until surgery. Description for Percocet given. He used to sparingly particularly with his past history of alcohol abuse. We discussed the patellar tendon repair possible placement of the wire to protect the repair. If wires placed understands she'll need a second procedure to remove the wire. He has not been using his crutches and a middle-aged with a knee immobilizer tight encourages the use of the crutches to decrease the amount hematoma in his knee elevation. Overnight stay in the hospital planned. Surgery was discussed questions were elicited he understands request to proceed Follow-Up Instructions: No Follow-up on file.   Orders:  No orders of the defined types were placed in this encounter.  No orders of the defined types were placed in this encounter.     Procedures: No procedures performed   Clinical Data: No additional findings.   Subjective: Chief Complaint  Patient presents with  . Right Knee - Dislocation     Patient states that he rolled his right knee on Friday while coming down a ladder, then he fell backwards.  Was seen at [facility], x-rays were taken.  Has on a knee immobilizer, used crutches for only one day.    Review of Systems 14 point review of systems updated. Patient just started a new job but doesn't have insurance coverage. History of alcohol dependency with pancreatitis. Previous left nephrectomy for renal cell carcinoma. History of GERD, cellulitis left leg with saphenous vein thrombosis negative for pulmonary embolism. Patient a prescription for Percocet given in ER.   Objective: Vital Signs: BP (!) 145/91   Pulse 87   Ht 6' 1.5" (1.867 m)   Wt 197 lb (89.4 kg)   BMI 25.64 kg/m   Physical Exam  Constitutional: He is oriented to person, place, and time. He appears well-developed and well-nourished.  HENT:  Head: Normocephalic and atraumatic.  Eyes: EOM are normal. Pupils are equal, round, and reactive to light.  Neck: No tracheal deviation present. No thyromegaly present.  Cardiovascular: Normal rate.   Pulmonary/Chest: Effort normal. He has no wheezes.  Abdominal: Soft. Bowel sounds are normal.  Previous left nephrectomy incision healed.  Musculoskeletal:  Since unable to extend his knee. Palpable defect of the patellar tendon which appears to be proximal area just off the inferior pole the patella. Quad tendon is intact. There is a knee effusion. Distal pulses are  intact negative Homan sensation the foot is normalnotch tenderness no pain with hip range of motion passively has good knee range of motion. Hamstrings are active and take resistance testing. No palpable patellar tendon anteriorly just below the patella.  Neurological: He is alert and oriented to person, place, and time.  Skin: Skin is warm and dry. Capillary refill takes less than 2 seconds.  Psychiatric: He has a normal mood and affect. His behavior is normal. Judgment and thought content normal.    Ortho  Exam ankle dorsiflexion plantar flexion is normal sensation the foot is normal. Good capillary refill.  Specialty Comments:  No specialty comments available.  Imaging: No results found. Review of x-rays show patella alter on lateral x-ray. X-rays are consistent with patellar tendon rupture.  PMFS History: Patient Active Problem List   Diagnosis Date Noted  . Thrombosis of saphenous vein, left 11/28/2015  . Alcohol induced acute pancreatitis 11/23/2015  . Elevated lipase 11/23/2015  . Pancreatitis 11/23/2015  . Acute pancreatitis 11/23/2015  . Renal mass 02/15/2015  . Renal cell carcinoma ([place]) 02/15/2015  . Cellulitis and abscess of leg, left 07/14/2012  . GERD (gastroesophageal reflux disease) 07/14/2012  . Alcohol dependence ([place]) 05/25/2012   Past Medical History:  Diagnosis Date  . Arthritis    right knee right wrist   . Chronic kidney disease   . GERD (gastroesophageal reflux disease)   . Hypertension    pt states was treated with medication back in 2000; currently on no medication   . Left renal mass   . Melanoma ([place]) dx'd 7yrs ago age 71   rt lat chest wall--surg only  . Renal cancer ([place]) dx'd 01/2015   left nephrectomy  . Wears glasses     Family History  Problem Relation Age of Onset  . Lung cancer Mother     Past Surgical History:  Procedure Laterality Date  . ABDOMINAL SURGERY     remove tumor cyst   . CYSTOSCOPY WITH RETROGRADE PYELOGRAM, URETEROSCOPY AND STENT PLACEMENT Left 07/19/2015   Procedure: LEFT RETROGRADE PYELOGRAM, LEFT URETEROSCOPY AND LEFT URETERAL STENT PLACEMENT;  Surgeon: Ardis Hughs, MD;  Location: [facility];  Service: Urology;  Laterality: Left;  . drained cyst      left kidney 10 months ago   . lymph nodes removed      right armpit secondary to melanoma   . NEPHRECTOMY Left 02/15/2015   Procedure: LEFT OPEN PARTIAL NEPHRECTOMY;  Surgeon: Ardis Hughs, MD;  Location: [facility];  Service: Urology;  Laterality: Left;  . right  armpit surgery     for melanoma   Social History   Occupational History  . Not on file.   Social History Main Topics  . Smoking status: Former Smoker    Packs/day: 0.25    Years: 20.00    Types: Cigarettes    Quit date: 04/16/2015  . Smokeless tobacco: Never Used  . Alcohol use Yes     Comment: 1-2 a day  . Drug use: No  . Sexual activity: No

## 2016-03-26 ENCOUNTER — Encounter (HOSPITAL_COMMUNITY): Payer: Self-pay | Admitting: *Deleted

## 2016-03-26 NOTE — Progress Notes (Signed)
Mr Garven denies chest pain or shortness of breath.  Patient does not have a PCP.

## 2016-03-27 ENCOUNTER — Observation Stay (HOSPITAL_COMMUNITY)
Admission: RE | Admit: 2016-03-27 | Discharge: 2016-03-28 | Disposition: A | Discharge status: Home / Self Care | Payer: Self-pay | Source: Ambulatory Visit | Attending: Orthopaedic Surgery | Admitting: Orthopaedic Surgery

## 2016-03-27 ENCOUNTER — Ambulatory Visit (HOSPITAL_COMMUNITY): Payer: Self-pay | Admitting: Anesthesiology

## 2016-03-27 ENCOUNTER — Encounter (HOSPITAL_COMMUNITY)
Admission: RE | Disposition: A | Discharge status: Home / Self Care | Payer: Self-pay | Source: Ambulatory Visit | Attending: Orthopaedic Surgery

## 2016-03-27 ENCOUNTER — Encounter (HOSPITAL_COMMUNITY): Payer: Self-pay | Admitting: *Deleted

## 2016-03-27 DIAGNOSIS — M19031 Primary osteoarthritis, right wrist: Secondary | ICD-10-CM | POA: Insufficient documentation

## 2016-03-27 DIAGNOSIS — F17210 Nicotine dependence, cigarettes, uncomplicated: Secondary | ICD-10-CM | POA: Insufficient documentation

## 2016-03-27 DIAGNOSIS — Z85820 Personal history of malignant melanoma of skin: Secondary | ICD-10-CM | POA: Insufficient documentation

## 2016-03-27 DIAGNOSIS — Z85528 Personal history of other malignant neoplasm of kidney: Secondary | ICD-10-CM | POA: Insufficient documentation

## 2016-03-27 DIAGNOSIS — K219 Gastro-esophageal reflux disease without esophagitis: Secondary | ICD-10-CM | POA: Insufficient documentation

## 2016-03-27 DIAGNOSIS — S86811A Strain of other muscle(s) and tendon(s) at lower leg level, right leg, initial encounter: Secondary | ICD-10-CM

## 2016-03-27 DIAGNOSIS — M13861 Other specified arthritis, right knee: Secondary | ICD-10-CM | POA: Insufficient documentation

## 2016-03-27 DIAGNOSIS — S86819A Strain of other muscle(s) and tendon(s) at lower leg level, unspecified leg, initial encounter: Secondary | ICD-10-CM

## 2016-03-27 DIAGNOSIS — S76111A Strain of right quadriceps muscle, fascia and tendon, initial encounter: Principal | ICD-10-CM | POA: Insufficient documentation

## 2016-03-27 DIAGNOSIS — W11XXXA Fall on and from ladder, initial encounter: Secondary | ICD-10-CM | POA: Insufficient documentation

## 2016-03-27 DIAGNOSIS — N189 Chronic kidney disease, unspecified: Secondary | ICD-10-CM | POA: Insufficient documentation

## 2016-03-27 DIAGNOSIS — I129 Hypertensive chronic kidney disease with stage 1 through stage 4 chronic kidney disease, or unspecified chronic kidney disease: Secondary | ICD-10-CM | POA: Insufficient documentation

## 2016-03-27 DIAGNOSIS — Z905 Acquired absence of kidney: Secondary | ICD-10-CM | POA: Insufficient documentation

## 2016-03-27 DIAGNOSIS — R262 Difficulty in walking, not elsewhere classified: Secondary | ICD-10-CM

## 2016-03-27 HISTORY — PX: PATELLAR TENDON REPAIR: SHX737

## 2016-03-27 LAB — COMPREHENSIVE METABOLIC PANEL
ALT: 18 U/L (ref 17–63)
AST: 28 U/L (ref 15–41)
Albumin: 4.2 g/dL (ref 3.5–5.0)
Alkaline Phosphatase: 56 U/L (ref 38–126)
Anion gap: 13 (ref 5–15)
BUN: 7 mg/dL (ref 6–20)
CO2: 23 mmol/L (ref 22–32)
Calcium: 9.8 mg/dL (ref 8.9–10.3)
Chloride: 102 mmol/L (ref 101–111)
Creatinine, Ser: 0.99 mg/dL (ref 0.61–1.24)
GFR calc Af Amer: >60 mL/min (ref >60)
GFR calc non Af Amer: >60 mL/min (ref >60)
Glucose, Bld: 98 mg/dL (ref 65–99)
Potassium: 3.9 mmol/L (ref 3.5–5.1)
Sodium: 138 mmol/L (ref 135–145)
Total Bilirubin: 0.8 mg/dL (ref 0.3–1.2)
Total Protein: 7.8 g/dL (ref 6.5–8.1)

## 2016-03-27 LAB — CBC
HCT: 38 % — ABNORMAL LOW (ref 39.0–52.0)
Hemoglobin: 12.4 g/dL — ABNORMAL LOW (ref 13.0–17.0)
MCH: 25.6 pg — ABNORMAL LOW (ref 26.0–34.0)
MCHC: 32.6 g/dL (ref 30.0–36.0)
MCV: 78.4 fL (ref 78.0–100.0)
Platelets: 202 10*3/uL (ref 150–400)
RBC: 4.85 MIL/uL (ref 4.22–5.81)
RDW: 16.2 % — ABNORMAL HIGH (ref 11.5–15.5)
WBC: 7.6 10*3/uL (ref 4.0–10.5)

## 2016-03-27 SURGERY — REPAIR, TENDON, PATELLAR
Anesthesia: General | Site: Knee | Laterality: Right

## 2016-03-27 MED ORDER — METOCLOPRAMIDE HCL 5 MG PO TABS: 5.0000 mg | ORAL_TABLET | Freq: Three times a day (TID) | ORAL | Status: DC | PRN

## 2016-03-27 MED ORDER — BUPIVACAINE HCL (PF) 0.25 % IJ SOLN
INTRAMUSCULAR | Status: AC
  Filled 2016-03-27: qty 30

## 2016-03-27 MED ORDER — 0.9 % SODIUM CHLORIDE (POUR BTL) OPTIME
TOPICAL | Status: DC | PRN
  Administered 2016-03-27: 1000 mL

## 2016-03-27 MED ORDER — HYDROMORPHONE HCL 1 MG/ML IJ SOLN
INTRAMUSCULAR | Status: AC
  Filled 2016-03-27: qty 0.5

## 2016-03-27 MED ORDER — MIDAZOLAM HCL 5 MG/5ML IJ SOLN
INTRAMUSCULAR | Status: DC | PRN
  Administered 2016-03-27: 2 mg via INTRAVENOUS

## 2016-03-27 MED ORDER — LACTATED RINGERS IV SOLN
INTRAVENOUS | Status: DC
  Administered 2016-03-27: 15:00:00 via INTRAVENOUS

## 2016-03-27 MED ORDER — ONDANSETRON HCL 4 MG PO TABS: 4.0000 mg | ORAL_TABLET | Freq: Four times a day (QID) | ORAL | Status: DC | PRN

## 2016-03-27 MED ORDER — PROPOFOL 10 MG/ML IV BOLUS
INTRAVENOUS | Status: DC | PRN
  Administered 2016-03-27: 200 mg via INTRAVENOUS

## 2016-03-27 MED ORDER — METHOCARBAMOL 500 MG PO TABS
500.0000 mg | ORAL_TABLET | Freq: Four times a day (QID) | ORAL | Status: DC | PRN
  Administered 2016-03-27 – 2016-03-28 (×3): 500 mg via ORAL
  Filled 2016-03-27 (×3): qty 1

## 2016-03-27 MED ORDER — ACETAMINOPHEN 650 MG RE SUPP: 650.0000 mg | Freq: Four times a day (QID) | RECTAL | Status: DC | PRN

## 2016-03-27 MED ORDER — PANTOPRAZOLE SODIUM 40 MG PO TBEC
40.0000 mg | DELAYED_RELEASE_TABLET | Freq: Every day | ORAL | Status: DC
  Administered 2016-03-28: 40 mg via ORAL
  Filled 2016-03-27: qty 1

## 2016-03-27 MED ORDER — BUPIVACAINE-EPINEPHRINE (PF) 0.5% -1:200000 IJ SOLN
INTRAMUSCULAR | Status: DC | PRN
  Administered 2016-03-27: 30 mL via PERINEURAL

## 2016-03-27 MED ORDER — METOCLOPRAMIDE HCL 5 MG/ML IJ SOLN: 5.0000 mg | Freq: Three times a day (TID) | INTRAMUSCULAR | Status: DC | PRN

## 2016-03-27 MED ORDER — MIDAZOLAM HCL 2 MG/2ML IJ SOLN
INTRAMUSCULAR | Status: AC
  Filled 2016-03-27: qty 2

## 2016-03-27 MED ORDER — MIDAZOLAM HCL 2 MG/2ML IJ SOLN
2.0000 mg | Freq: Once | INTRAMUSCULAR | Status: AC
  Administered 2016-03-27: 2 mg via INTRAVENOUS

## 2016-03-27 MED ORDER — FENTANYL CITRATE (PF) 100 MCG/2ML IJ SOLN
50.0000 ug | Freq: Once | INTRAMUSCULAR | Status: AC
  Administered 2016-03-27: 50 ug via INTRAVENOUS

## 2016-03-27 MED ORDER — ACETAMINOPHEN 325 MG PO TABS: 650.0000 mg | ORAL_TABLET | Freq: Four times a day (QID) | ORAL | Status: DC | PRN

## 2016-03-27 MED ORDER — CHLORHEXIDINE GLUCONATE 4 % EX LIQD: 60.0000 mL | Freq: Once | CUTANEOUS | Status: DC

## 2016-03-27 MED ORDER — ASPIRIN 325 MG PO TABS
325.0000 mg | ORAL_TABLET | Freq: Every day | ORAL | Status: DC
  Administered 2016-03-27 – 2016-03-28 (×2): 325 mg via ORAL
  Filled 2016-03-27 (×2): qty 1

## 2016-03-27 MED ORDER — PROMETHAZINE HCL 25 MG/ML IJ SOLN: 6.2500 mg | INTRAMUSCULAR | Status: DC | PRN

## 2016-03-27 MED ORDER — ONDANSETRON HCL 4 MG/2ML IJ SOLN: 4.0000 mg | Freq: Four times a day (QID) | INTRAMUSCULAR | Status: DC | PRN

## 2016-03-27 MED ORDER — LACTATED RINGERS IV SOLN: INTRAVENOUS | Status: DC

## 2016-03-27 MED ORDER — SODIUM CHLORIDE 0.9 % IV SOLN
INTRAVENOUS | Status: DC
  Administered 2016-03-27: 21:00:00 via INTRAVENOUS

## 2016-03-27 MED ORDER — HYDROMORPHONE HCL 2 MG/ML IJ SOLN
0.5000 mg | INTRAMUSCULAR | Status: DC | PRN
  Administered 2016-03-27 – 2016-03-28 (×2): 0.5 mg via INTRAVENOUS
  Filled 2016-03-27 (×2): qty 1

## 2016-03-27 MED ORDER — FENTANYL CITRATE (PF) 100 MCG/2ML IJ SOLN
INTRAMUSCULAR | Status: DC | PRN
  Administered 2016-03-27 (×3): 50 ug via INTRAVENOUS

## 2016-03-27 MED ORDER — BUPIVACAINE HCL (PF) 0.25 % IJ SOLN
INTRAMUSCULAR | Status: DC | PRN
  Administered 2016-03-27: 10 mL

## 2016-03-27 MED ORDER — ONDANSETRON HCL 4 MG/2ML IJ SOLN
INTRAMUSCULAR | Status: DC | PRN
  Administered 2016-03-27: 10 mg via INTRAVENOUS
  Administered 2016-03-27: 4 mg via INTRAVENOUS

## 2016-03-27 MED ORDER — FENTANYL CITRATE (PF) 100 MCG/2ML IJ SOLN
INTRAMUSCULAR | Status: AC
  Filled 2016-03-27: qty 4

## 2016-03-27 MED ORDER — OXYCODONE HCL 5 MG PO TABS
5.0000 mg | ORAL_TABLET | Freq: Four times a day (QID) | ORAL | Status: DC | PRN
  Administered 2016-03-27 – 2016-03-28 (×3): 10 mg via ORAL
  Filled 2016-03-27 (×3): qty 2

## 2016-03-27 MED ORDER — PROPOFOL 10 MG/ML IV BOLUS
INTRAVENOUS | Status: AC
  Filled 2016-03-27: qty 20

## 2016-03-27 MED ORDER — DOCUSATE SODIUM 100 MG PO CAPS
100.0000 mg | ORAL_CAPSULE | Freq: Two times a day (BID) | ORAL | Status: DC
  Administered 2016-03-27 – 2016-03-28 (×2): 100 mg via ORAL
  Filled 2016-03-27 (×2): qty 1

## 2016-03-27 MED ORDER — MIDAZOLAM HCL 2 MG/2ML IJ SOLN
INTRAMUSCULAR | Status: AC
  Administered 2016-03-27: 2 mg via INTRAVENOUS
  Filled 2016-03-27: qty 2

## 2016-03-27 MED ORDER — HYDROMORPHONE HCL 1 MG/ML IJ SOLN
0.2500 mg | INTRAMUSCULAR | Status: DC | PRN
  Administered 2016-03-27 (×4): 0.5 mg via INTRAVENOUS

## 2016-03-27 MED ORDER — METHOCARBAMOL 1000 MG/10ML IJ SOLN
500.0000 mg | Freq: Four times a day (QID) | INTRAVENOUS | Status: DC | PRN
  Filled 2016-03-27: qty 5

## 2016-03-27 MED ORDER — CEFAZOLIN SODIUM-DEXTROSE 2-4 GM/100ML-% IV SOLN: 2.0000 g | INTRAVENOUS | Status: DC

## 2016-03-27 MED ORDER — FENTANYL CITRATE (PF) 100 MCG/2ML IJ SOLN
INTRAMUSCULAR | Status: AC
  Administered 2016-03-27: 50 ug via INTRAVENOUS
  Filled 2016-03-27: qty 2

## 2016-03-27 MED ORDER — LIDOCAINE HCL (CARDIAC) 20 MG/ML IV SOLN
INTRAVENOUS | Status: DC | PRN
Start: 2016-03-27 — End: 2016-03-27
  Administered 2016-03-27: 100 mg via INTRAVENOUS

## 2016-03-27 MED ORDER — CEFAZOLIN SODIUM-DEXTROSE 2-4 GM/100ML-% IV SOLN
INTRAVENOUS | Status: AC
  Filled 2016-03-27: qty 100

## 2016-03-27 SURGICAL SUPPLY — 55 items
BANDAGE ACE 4X5 VEL STRL LF (GAUZE/BANDAGES/DRESSINGS) ×2 IMPLANT
BANDAGE ACE 6X5 VEL STRL LF (GAUZE/BANDAGES/DRESSINGS) ×2 IMPLANT
BLADE SURG ROTATE 9660 (MISCELLANEOUS) ×2 IMPLANT
BNDG COHESIVE 4X5 TAN STRL (GAUZE/BANDAGES/DRESSINGS) ×2 IMPLANT
COVER SURGICAL LIGHT HANDLE (MISCELLANEOUS) ×2 IMPLANT
CUFF TOURNIQUET SINGLE 34IN LL (TOURNIQUET CUFF) IMPLANT
CUFF TOURNIQUET SINGLE 44IN (TOURNIQUET CUFF) IMPLANT
DRAPE INCISE IOBAN 66X45 STRL (DRAPES) ×2 IMPLANT
DRSG ADAPTIC 3X8 NADH LF (GAUZE/BANDAGES/DRESSINGS) ×2 IMPLANT
DRSG EMULSION OIL 3X3 NADH (GAUZE/BANDAGES/DRESSINGS) ×2 IMPLANT
DRSG PAD ABDOMINAL 8X10 ST (GAUZE/BANDAGES/DRESSINGS) ×2 IMPLANT
DURAPREP 26ML APPLICATOR (WOUND CARE) ×2 IMPLANT
ELECT REM PT RETURN 9FT ADLT (ELECTROSURGICAL) ×2
ELECTRODE REM PT RTRN 9FT ADLT (ELECTROSURGICAL) ×1 IMPLANT
GAUZE XEROFORM 5X9 LF (GAUZE/BANDAGES/DRESSINGS) ×2 IMPLANT
GLOVE BIOGEL PI IND STRL 8 (GLOVE) ×2 IMPLANT
GLOVE BIOGEL PI INDICATOR 8 (GLOVE) ×2
GLOVE ORTHO TXT STRL SZ7.5 (GLOVE) ×4 IMPLANT
GOWN STRL REUS W/ TWL LRG LVL3 (GOWN DISPOSABLE) ×1 IMPLANT
GOWN STRL REUS W/ TWL XL LVL3 (GOWN DISPOSABLE) ×1 IMPLANT
GOWN STRL REUS W/TWL 2XL LVL3 (GOWN DISPOSABLE) ×2 IMPLANT
GOWN STRL REUS W/TWL LRG LVL3 (GOWN DISPOSABLE) ×1
GOWN STRL REUS W/TWL XL LVL3 (GOWN DISPOSABLE) ×1
KIT BASIN OR (CUSTOM PROCEDURE TRAY) ×2 IMPLANT
KIT ROOM TURNOVER OR (KITS) ×2 IMPLANT
MANIFOLD NEPTUNE II (INSTRUMENTS) ×2 IMPLANT
NEEDLE 22X1 1/2 (OR ONLY) (NEEDLE) IMPLANT
NEEDLE KEITH (NEEDLE) ×2 IMPLANT
NEEDLE STRAIGHT KEITH (NEEDLE) ×2 IMPLANT
NS IRRIG 1000ML POUR BTL (IV SOLUTION) ×2 IMPLANT
PACK ORTHO EXTREMITY (CUSTOM PROCEDURE TRAY) ×2 IMPLANT
PAD ARMBOARD 7.5X6 YLW CONV (MISCELLANEOUS) ×4 IMPLANT
PAD CAST 4YDX4 CTTN HI CHSV (CAST SUPPLIES) ×1 IMPLANT
PADDING CAST COTTON 4X4 STRL (CAST SUPPLIES) ×1
PADDING CAST COTTON 6X4 STRL (CAST SUPPLIES) ×2 IMPLANT
PASSER SUT SWANSON 36MM LOOP (INSTRUMENTS) ×2 IMPLANT
SPONGE GAUZE 4X4 12PLY STER LF (GAUZE/BANDAGES/DRESSINGS) ×2 IMPLANT
SPONGE LAP 4X18 X RAY DECT (DISPOSABLE) ×4 IMPLANT
STAPLER VISISTAT 35W (STAPLE) ×2 IMPLANT
STOCKINETTE IMPERVIOUS 9X36 MD (GAUZE/BANDAGES/DRESSINGS) ×2 IMPLANT
SUCTION FRAZIER HANDLE 10FR (MISCELLANEOUS)
SUCTION TUBE FRAZIER 10FR DISP (MISCELLANEOUS) IMPLANT
SUT FIBERWIRE #2 38 REV NDL BL (SUTURE) ×10
SUT VIC AB 0 CT1 27 (SUTURE) ×1
SUT VIC AB 0 CT1 27XBRD ANBCTR (SUTURE) ×1 IMPLANT
SUT VIC AB 2-0 CT1 27 (SUTURE) ×2
SUT VIC AB 2-0 CT1 TAPERPNT 27 (SUTURE) ×2 IMPLANT
SUTURE FIBERWR#2 38 REV NDL BL (SUTURE) ×5 IMPLANT
SYR CONTROL 10ML LL (SYRINGE) IMPLANT
TOWEL OR 17X24 6PK STRL BLUE (TOWEL DISPOSABLE) ×2 IMPLANT
TOWEL OR 17X26 10 PK STRL BLUE (TOWEL DISPOSABLE) ×2 IMPLANT
TUBE CONNECTING 12X1/4 (SUCTIONS) ×2 IMPLANT
UNDERPAD 30X30 (UNDERPADS AND DIAPERS) ×2 IMPLANT
WATER STERILE IRR 1000ML POUR (IV SOLUTION) ×2 IMPLANT
YANKAUER SUCT BULB TIP NO VENT (SUCTIONS) ×2 IMPLANT

## 2016-03-27 NOTE — Anesthesia Postprocedure Evaluation (Addendum)
Anesthesia Post Note  Patient: James [name]  Procedure(s) Performed: Procedure(s) (LRB): RIGHT PATELLA TENDON REPAIR (Right)  Patient location during evaluation: PACU Anesthesia Type: General and Regional Level of consciousness: awake and sedated Pain management: pain level controlled Vital Signs Assessment: post-procedure vital signs reviewed and stable Respiratory status: spontaneous breathing, nonlabored ventilation, respiratory function stable and patient connected to nasal cannula oxygen Cardiovascular status: blood pressure returned to baseline and stable Postop Assessment: no signs of nausea or vomiting Anesthetic complications: no       Last Vitals:  Vitals:   03/27/16 1408 03/27/16 1640  BP: (!) 142/102   Pulse: 100   Resp: 14   Temp:  (P) 36.5 C    Last Pain:  Vitals:   03/27/16 1405  PainSc: 0-No pain                 [name],JAMES TERRILL

## 2016-03-27 NOTE — Anesthesia Preprocedure Evaluation (Addendum)
Anesthesia Evaluation  Patient identified by MRN, date of birth, ID band Patient awake    Reviewed: Allergy & Precautions, NPO status , Patient's Chart, lab work & pertinent test results  Airway Mallampati: II  TM Distance: >3 FB Neck ROM: Full    Dental  (+) Dental Advisory Given   Pulmonary Current Smoker,    breath sounds clear to auscultation       Cardiovascular hypertension,  Rhythm:Regular Rate:Normal     Neuro/Psych negative neurological ROS     GI/Hepatic GERD  ,(+)     substance abuse  alcohol use,   Endo/Other  negative endocrine ROS  Renal/GU Renal disease     Musculoskeletal   Abdominal   Peds  Hematology  (+) anemia ,   Anesthesia Other Findings   Reproductive/Obstetrics                            Lab Results  Component Value Date   WBC 7.6 03/27/2016   HGB 12.4 (L) 03/27/2016   HCT 38.0 (L) 03/27/2016   MCV 78.4 03/27/2016   PLT 202 03/27/2016   Lab Results  Component Value Date   CREATININE 0.99 03/27/2016   BUN 7 03/27/2016   NA 138 03/27/2016   K 3.9 03/27/2016   CL 102 03/27/2016   CO2 23 03/27/2016    Anesthesia Physical Anesthesia Plan  ASA: II  Anesthesia Plan: General   Post-op Pain Management:  Regional for Post-op pain   Induction: Intravenous  Airway Management Planned: LMA  Additional Equipment:   Intra-op Plan:   Post-operative Plan: Extubation in OR  Informed Consent: I have reviewed the patients History and Physical, chart, labs and discussed the procedure including the risks, benefits and alternatives for the proposed anesthesia with the patient or authorized representative who has indicated his/her understanding and acceptance.   Dental advisory given  Plan Discussed with: CRNA  Anesthesia Plan Comments:         Anesthesia Quick Evaluation

## 2016-03-27 NOTE — Anesthesia Procedure Notes (Signed)
Anesthesia Regional Block:  Femoral nerve block  Pre-Anesthetic Checklist: ,, timeout performed, Correct Patient, Correct Site, Correct Laterality, Correct Procedure, Correct Position, site marked, Risks and benefits discussed,  Surgical consent,  Pre-op evaluation,  At surgeon's request and post-op pain management  Laterality: Right  Prep: chloraprep       Needles:  Injection technique: Single-shot  Needle Type: Echogenic Needle     Needle Length: 9cm 9 cm Needle Gauge: 21 and 21 G    Additional Needles:  Procedures: ultrasound guided (picture in chart) and nerve stimulator Femoral nerve block  Nerve Stimulator or Paresthesia:  Response: quadriceps, 0.45 mA,   Additional Responses:   Narrative:  Start time: 03/27/2016 1:55 PM End time: 03/27/2016 2:02 PM Injection made incrementally with aspirations every 5 mL.  Performed by: Personally  Anesthesiologist: Suzette Battiest

## 2016-03-27 NOTE — Anesthesia Procedure Notes (Signed)
Procedure Name: LMA Insertion Date/Time: 03/27/2016 3:14 PM Performed by: Gala Lewandowsky B Pre-anesthesia Checklist: Patient identified, Emergency Drugs available, Suction available and Patient being monitored Patient Re-evaluated:Patient Re-evaluated prior to inductionOxygen Delivery Method: Circle System Utilized Preoxygenation: Pre-oxygenation with 100% oxygen Intubation Type: IV induction Ventilation: Mask ventilation without difficulty LMA: LMA inserted LMA Size: 5.0 Number of attempts: 1 Placement Confirmation: positive ETCO2 Tube secured with: Tape Dental Injury: Teeth and Oropharynx as per pre-operative assessment

## 2016-03-27 NOTE — Transfer of Care (Signed)
Immediate Anesthesia Transfer of Care Note  Patient: James [name]  Procedure(s) Performed: Procedure(s): RIGHT PATELLA TENDON REPAIR (Right)  Patient Location: PACU  Anesthesia Type:General  Level of Consciousness: awake, alert , oriented and patient cooperative  Airway & Oxygen Therapy: Patient Spontanous Breathing and Patient connected to nasal cannula oxygen  Post-op Assessment: Report given to RN and Post -op Vital signs reviewed and stable  Post vital signs: Reviewed and stable  Last Vitals:  Vitals:   03/27/16 1405 03/27/16 1408  BP: 133/87 (!) 142/102  Pulse: 89 100  Resp:  14  Temp:      Last Pain:  Vitals:   03/27/16 1405  PainSc: 0-No pain      Patients Stated Pain Goal: 1 (0000000 [id number])  Complications: No apparent anesthesia complications   General plus regional

## 2016-03-27 NOTE — H&P (Signed)
James [name] is an 56 y.o. male.   Chief Complaint: right patella tendon rupture HPI: patient with the above complaint presents to the hospital today for surgical intervention.patient fell off a ladder 03/22/2016  Past Medical History:  Diagnosis Date  . Arthritis    right knee right wrist   . Chronic kidney disease   . GERD (gastroesophageal reflux disease)   . Hypertension    pt states was treated with medication back in 2000; currently on no medication   . Left renal mass   . Melanoma ([place]) dx'd 16yrs ago age 52   rt lat chest wall--surg only  . Renal cancer ([place]) dx'd 01/2015   left nephrectomy  . Wears glasses     Past Surgical History:  Procedure Laterality Date  . ABDOMINAL SURGERY  2017   remove tumor cyst kidney  . COLONOSCOPY    . CYSTOSCOPY WITH RETROGRADE PYELOGRAM, URETEROSCOPY AND STENT PLACEMENT Left 07/19/2015   Procedure: LEFT RETROGRADE PYELOGRAM, LEFT URETEROSCOPY AND LEFT URETERAL STENT PLACEMENT;  Surgeon: Ardis Hughs, MD;  Location: [facility];  Service: Urology;  Laterality: Left;  . drained cyst      left kidney 10 months ago   . lymph nodes removed      right armpit secondary to melanoma   . NEPHRECTOMY Left 02/15/2015   Procedure: LEFT OPEN PARTIAL NEPHRECTOMY;  Surgeon: Ardis Hughs, MD;  Location: [facility];  Service: Urology;  Laterality: Left;  . right armpit surgery     for melanoma    Family History  Problem Relation Age of Onset  . Lung cancer Mother    Social History:  reports that he has been smoking Cigarettes.  He has a 6.60 pack-year smoking history. He has never used smokeless tobacco. He reports that he does not drink alcohol or use drugs.  Allergies:  Allergies  Allergen Reactions  . Sulfa Antibiotics Swelling    No prescriptions prior to admission.    No results found for this or any previous visit (from the past 48 hour(s)). No results found.  Review of Systems  Constitutional: Negative.   HENT: Negative.    Eyes: Negative.   Respiratory: Negative.   Cardiovascular: Negative.   Gastrointestinal: Negative.   Musculoskeletal: Positive for joint pain.  Skin: Negative.   Neurological: Negative.   Psychiatric/Behavioral: Negative.     There were no vitals taken for this visit. Physical Exam  Constitutional: He is oriented to person, place, and time. He appears well-developed. No distress.  HENT:  Head: Normocephalic and atraumatic.  Eyes: EOM are normal. Pupils are equal, round, and reactive to light.  Neck: Normal range of motion.  Respiratory: No respiratory distress.  GI: He exhibits no distension.  Musculoskeletal:  Musculoskeletal:  Since unable to extend his knee. Palpable defect of the patellar tendon which appears to be proximal area just off the inferior pole the patella. Quad tendon is intact. There is a knee effusion. Distal pulses are intact negative Homan sensation the foot is normalnotch tenderness no pain with hip range of motion passively has good knee range of motion. Hamstrings are active and take resistance testing. No palpable patellar tendon anteriorly just below the patella.   Neurological: He is alert and oriented to person, place, and time.  Skin: Skin is warm and dry.  Psychiatric: He has a normal mood and affect.    Study Result   CLINICAL DATA:  Acute onset of right knee pain.  Initial encounter.  EXAM: PORTABLE  RIGHT KNEE - 1-2 VIEW  COMPARISON:  Right knee radiographs performed 12/25/2003  FINDINGS: There is no evidence of fracture or dislocation. The joint spaces are preserved. No significant degenerative change is seen; the patellofemoral joint is grossly unremarkable in appearance.  Apparent patella alta is noted, new from 2005. Would correlate for any evidence of patellar tendon injury.  No significant joint effusion is seen. The visualized soft tissues are normal in appearance.  IMPRESSION: 1. No evidence of fracture or dislocation. 2.  Apparent patella alta noted, new from 2005. Would correlate for any evidence of patellar tendon injury.   Electronically Signed   By: Garald Balding M.D.   On: 03/22/2016 17:56    Assessment/Plan Right patella tendon rupture   We'll proceed with RIGHT PATELLA TENDON REPAIR as scheduled. Surgical procedure along with possible rehabilitation/recovery time discussed. All questions answered.  Benjiman Core, PA-C 03/27/2016, 10:22 AM

## 2016-03-28 ENCOUNTER — Encounter (HOSPITAL_COMMUNITY): Payer: Self-pay | Admitting: Orthopaedic Surgery

## 2016-03-28 MED ORDER — OXYCODONE-ACETAMINOPHEN 5-325 MG PO TABS: 1.0000 | ORAL_TABLET | ORAL | 0 refills | Status: DC | PRN

## 2016-03-28 MED ORDER — WHITE PETROLATUM GEL
Status: AC
  Administered 2016-03-28: 03:00:00
  Filled 2016-03-28: qty 1

## 2016-03-28 MED ORDER — METHOCARBAMOL 500 MG PO TABS: 500.0000 mg | ORAL_TABLET | Freq: Four times a day (QID) | ORAL | 0 refills | Status: DC | PRN

## 2016-03-28 NOTE — Op Note (Signed)
[name], [name]              ACCOUNT NO.:  [id number]  MEDICAL RECORD NO.:  [id number]  LOCATION:                                 FACILITY:  PHYSICIAN:  Mark C. Lorin Mercy, M.D.    DATE OF BIRTH:  11-Sep-1960  DATE OF PROCEDURE:  03/27/2016 DATE OF DISCHARGE:                              OPERATIVE REPORT   PREOPERATIVE DIAGNOSIS:  Right patellar tendon rupture.  POSTOPERATIVE DIAGNOSIS:  Right patellar tendon rupture.  PROCEDURE:  Right patellar tendon repair.  SURGEON:  Mark C. Lorin Mercy, M.D.  ASSISTANT:  Alyson Locket. Ricard Dillon, PA-C, medically necessary and present for the entire procedure.  ANESTHESIA:  General.  TOURNIQUET TIME:  Less than 1 hour.  BRIEF HISTORY:  This patient slipped on the ladder, suffered patellar tendon rupture with high-riding patella, inability to extend his leg and patellar tendon ripped off the inferior pole of the patella.  DESCRIPTION OF PROCEDURE:  After standard prepping and draping, proximal thigh tourniquet, preoperative antibiotics, time-out procedure, extremity sheets and drapes were applied.  The leg had been clipped anteriorly.  Midline incision was made.  Betadine, Steri-Drape had been applied.  Leg was elevated and wrapped with an Esmarch.  Tourniquet inflated with the knee extended.  Midline incision was made.  Sheath over the patellar tendon was split and immediately fluid was present and the patellar tendon ripped off the inferior pole of the patella.  There was a small spur midportion of the patella sticking out consistent with the patient's chronic tendinopathy problems.  This spur was turned back so that the inferior pole of the patella was flat.  Banal #2 FiberWire was placed in weaves, 3 separate sutures through the tendon coming back and then Ward needles were drilled using a power drill through the old stump attachment of the patellar tendon and then exited over the dorsum, sutures were tied.  One suture was passed on each Imlay  needle on the ends and then 4 Finley needles the rest 2 sutures.  This allowed the tendon to be snugged up into its anatomic position for repair.  Tension was held on sutures while each one was sequentially tied down, multiple knots then cut.  Retinaculum was torn both medial and laterally all the way to the collateral and repair was made with figure-of-eight FiberWire sutures repairing the retinaculum with 4 to 5 sutures on each side. Knee was flexed 90 degrees, repair held nicely, irrigation, 2-0 Vicryl subcutaneous tissue reapproximated and skin closure with staples. Postop dressing, knee immobilizer.  The patient tolerated the procedure well.  Transferred to the recovery room in stable condition.     Mark C. Lorin Mercy, M.D.     MCY/MEDQ  D:  03/27/2016  T:  03/28/2016  Job:  [id number]

## 2016-03-28 NOTE — Evaluation (Signed)
Occupational Therapy Evaluation and Discharge Patient Details Name: James [name] MRN: [id number] DOB: May 25, 1960 Today's Date: 03/28/2016    History of Present Illness 56 y/o male who fell off a ladder and is now s/p [place]. Pt has a past medical history including Arthritis; Chronic kidney disease; Hypertension; Left renal mass; Renal cancer; and Wears glasses. Pt has a past surgical history that includes right armpit surgery; lymph nodes removed ; drained cyst ; Nephrectomy (Left, 02/15/2015); Cystoscopy with retrograde pyelogram, ureteroscopy and stent placement (Left, 07/19/2015); and Abdominal surgery (2017).   Clinical Impression   PTA Pt independent in ADL and mobility. Pt currently mod A for LB ADL and supervision for mobility with RW. Pt greeted in bathroom standing bearing weight through both legs. OT kindly informed Pt of NWB status, and Pt very agitated stated "don't come in here and tell me how to do things, I got in here didn't I? I am a grown man, I do not need someone telling me how to do things that I can do for myself." OT informed him that it was our job to make sure that he is safe, minding precautions and to provide education so he can heal properly. Pt assessed and educated about precautions and sequencing for LB dressing, demonstrated don/doff of KI, and completed education. At the end of the session, OT apologized for how the session started but that OT wished him the best in recovery and good luck. Education complete. OT to sign off at this time.     Follow Up Recommendations  No OT follow up;Supervision/Assistance - 24 hour (initially)    Equipment Recommendations  None recommended by OT (Pt declined 3 in 1 after edcuation)    Recommendations for Other Services       Precautions / Restrictions Precautions Precautions: Fall Required Braces or Orthoses: Knee Immobilizer - Right Knee Immobilizer - Right: On at all times Restrictions Weight  Bearing Restrictions: Yes RLE Weight Bearing: Non weight bearing Other Position/Activity Restrictions: do not bend knee - Knee Immobilizer on at all times      Mobility Bed Mobility               General bed mobility comments: Pt met in bathroom, and then mobilized to chair - not observed  Transfers Overall transfer level: Modified independent Equipment used: Rolling walker (2 wheeled)             General transfer comment: good hand placement going stand > sit    Balance Overall balance assessment: Needs assistance Sitting-balance support: No upper extremity supported;Feet supported Sitting balance-Leahy Scale: Good     Standing balance support: Bilateral upper extremity supported;During functional activity Standing balance-Leahy Scale: Poor (approaching fair)                              ADL Overall ADL's : Needs assistance/impaired     Grooming: Set up;Sitting Grooming Details (indicate cue type and reason): in recliner, Pt declined sink level grooming             Lower Body Dressing: Maximal assistance;Cueing for sequencing Lower Body Dressing Details (indicate cue type and reason): OT demonstrated don/doff of brace as well as educated that Pt should dress operated leg first with no bend in knee allowed Toilet Transfer: Supervision/safety;RW Toilet Transfer Details (indicate cue type and reason): Pt found in bathroom, he has been using it independently. Pt stated that he does  not have any problems getting on or off the commode or performing peri care         Functional mobility during ADLs: Supervision/safety;Rolling walker       Vision Vision Assessment?: No apparent visual deficits   Perception     Praxis      Pertinent Vitals/Pain Pain Assessment: 0-10 Pain Score: 7  Pain Location: right knee Pain Descriptors / Indicators: Sore;Grimacing Pain Intervention(s): Monitored during session;Repositioned (ice offered, Pt declined)      Hand Dominance     Extremity/Trunk Assessment Upper Extremity Assessment Upper Extremity Assessment: Overall WFL for tasks assessed   Lower Extremity Assessment Lower Extremity Assessment: RLE deficits/detail RLE: Unable to fully assess due to immobilization   Cervical / Trunk Assessment Cervical / Trunk Assessment: Normal   Communication Communication Communication: No difficulties   Cognition Arousal/Alertness: Awake/alert Behavior During Therapy: WFL for tasks assessed/performed (Pt very angry that Therapist stated he should be NWB) Overall Cognitive Status: Within Functional Limits for tasks assessed                     General Comments       Exercises       Shoulder Instructions      Home Living Family/patient expects to be discharged to:: Private residence Living Arrangements: Parent Available Help at Discharge: Family;Available 24 hours/day Type of Home: House Home Access: Level entry     Home Layout: One level     Bathroom Shower/Tub: Teacher, early years/pre: Standard     Home Equipment: Crutches          Prior Functioning/Environment Level of Independence: Independent        Comments: completely independent, working and did not require any assistance.         OT Problem List: Pain;Impaired balance (sitting and/or standing);Decreased knowledge of use of DME or AE;Decreased knowledge of precautions;Decreased range of motion   OT Treatment/Interventions:      OT Goals(Current goals can be found in the care plan section) Acute Rehab OT Goals Patient Stated Goal: to get home OT Goal Formulation: With patient Time For Goal Achievement: 04/04/16 Potential to Achieve Goals: Good  OT Frequency:     Barriers to D/C:            Co-evaluation PT/OT/SLP Co-Evaluation/Treatment: Yes Reason for Co-Treatment: To address functional/ADL transfers;For patient/therapist safety PT goals addressed during session:  Mobility/safety with mobility OT goals addressed during session: ADL's and self-care      End of Session Equipment Utilized During Treatment: Rolling walker;Right knee immobilizer Nurse Communication: Mobility status;Weight bearing status;Other (comment) (in room with Pt)  Activity Tolerance: Patient tolerated treatment well Patient left: in chair;with nursing/sitter in room;with family/visitor present;with call bell/phone within reach   Time: 1052-1108 OT Time Calculation (min): 16 min Charges:  OT General Charges $OT Visit: 1 Procedure OT Evaluation $OT Eval Low Complexity: 1 Procedure G-Codes: OT G-codes **NOT FOR INPATIENT CLASS** Functional Assessment Tool Used: Clinical Judgement Functional Limitation: Self care Self Care Current Status (K7425): At least 20 percent but less than 40 percent impaired, limited or restricted Self Care Goal Status (Z5638): 0 percent impaired, limited or restricted Self Care Discharge Status [phone]): At least 20 percent but less than 40 percent impaired, limited or restricted  Jaci Carrel 03/28/2016, 11:25 AM Hulda Humphrey OTR/L [phone]

## 2016-03-28 NOTE — Progress Notes (Signed)
Orthopedic Tech Progress Note Patient Details:  James [name] September 01, 1960 [id number]  Ortho Devices Type of Ortho Device: Knee Immobilizer Ortho Device/Splint Interventions: Application   [name] 03/28/2016, 8:42 AM

## 2016-03-28 NOTE — Care Management (Signed)
No Home Health needs identified. Case Manager has ordered RW from [place]. Ricki Miller, RN BSN Case Manager

## 2016-03-28 NOTE — Evaluation (Signed)
Physical Therapy Evaluation Patient Details Name: James [name] MRN: [id number] DOB: [birth year] Today's Date: 03/28/2016   History of Present Illness  56 y/o male who fell off a ladder and is now s/p RIGHT PATELLA TENDON REPAIR. Pt has a past medical history including Arthritis; Chronic kidney disease; Hypertension; Left renal mass; Renal cancer; and Wears glasses. Pt has a past surgical history that includes right armpit surgery; lymph nodes removed ; drained cyst ; Nephrectomy (Left, 02/15/2015); Cystoscopy with retrograde pyelogram, ureteroscopy and stent placement (Left, 07/19/2015); and Abdominal surgery (2017).  Clinical Impression  Pt presents with the above diagnosis POD 1. Pt is met in the bathroom by OT and is easily agitated and angry at the attempted assistance of these clinicians. Pt requires cues to maintain NWB on RLE with gait. Pt instructed on the importance of maintaining NWB and that he is not allowed to perform any mobility without rolling walker. Pt will not require any further PT follow up.     Follow Up Recommendations      Equipment Recommendations       Recommendations for Other Services       Precautions / Restrictions Precautions Precautions: Fall Required Braces or Orthoses: Knee Immobilizer - Right Knee Immobilizer - Right: On at all times Restrictions Weight Bearing Restrictions: Yes RLE Weight Bearing: Non weight bearing Other Position/Activity Restrictions: do not bend knee - Knee Immobilizer on at all times      Mobility  Bed Mobility               General bed mobility comments: pt oob coming out of bathroom with OT when PT arrives  Transfers Overall transfer level: Modified independent Equipment used: Rolling walker (2 wheeled)             General transfer comment: good hand placement going stand > sit  Ambulation/Gait Ambulation/Gait assistance: Modified independent (Device/Increase time) Ambulation Distance (Feet): 10  Feet Assistive device: Rolling walker (2 wheeled) Gait Pattern/deviations: Step-to pattern        Stairs            Wheelchair Mobility    Modified Rankin (Stroke Patients Only)       Balance Overall balance assessment: Needs assistance Sitting-balance support: No upper extremity supported;Feet supported Sitting balance-Leahy Scale: Good     Standing balance support: Bilateral upper extremity supported;During functional activity Standing balance-Leahy Scale: Poor Standing balance comment: relies on RW for stability                             Pertinent Vitals/Pain Pain Assessment: 0-10 Pain Score: 7  Pain Location: right knee Pain Descriptors / Indicators: Sore;Grimacing Pain Intervention(s): Monitored during session;Repositioned;Other (comment) (ice offered, pt declined)    Home Living Family/patient expects to be discharged to:: Private residence Living Arrangements: Parent Available Help at Discharge: Family;Available 24 hours/day Type of Home: House Home Access: Level entry     Home Layout: One level Home Equipment: Crutches      Prior Function Level of Independence: Independent         Comments: completely independent, working and did not require any assistance.      Hand Dominance        Extremity/Trunk Assessment   Upper Extremity Assessment Upper Extremity Assessment: Defer to OT evaluation    Lower Extremity Assessment Lower Extremity Assessment: RLE deficits/detail RLE: Unable to fully assess due to immobilization    Cervical / Trunk Assessment Cervical /  Trunk Assessment: Normal  Communication   Communication: No difficulties  Cognition Arousal/Alertness: Awake/alert Behavior During Therapy: WFL for tasks assessed/performed Overall Cognitive Status: Within Functional Limits for tasks assessed                      General Comments      Exercises     Assessment/Plan    PT Assessment Patent does  not need any further PT services  PT Problem List            PT Treatment Interventions      PT Goals (Current goals can be found in the Care Plan section)  Acute Rehab PT Goals Patient Stated Goal: to get home    Frequency     Barriers to discharge        Co-evaluation PT/OT/SLP Co-Evaluation/Treatment: Yes Reason for Co-Treatment: To address functional/ADL transfers PT goals addressed during session: Mobility/safety with mobility OT goals addressed during session: ADL's and self-care       End of Session Equipment Utilized During Treatment: Right knee immobilizer Activity Tolerance: Patient tolerated treatment well Patient left: in chair;with call bell/phone within reach;with nursing/sitter in room Nurse Communication: Mobility status    Functional Assessment Tool Used: clinical judgement Functional Limitation: Mobility: Walking and moving around Mobility: Walking and Moving Around Current Status (C2883): At least 1 percent but less than 20 percent impaired, limited or restricted Mobility: Walking and Moving Around Goal Status [phone]): At least 1 percent but less than 20 percent impaired, limited or restricted Mobility: Walking and Moving Around Discharge Status [phone]): At least 1 percent but less than 20 percent impaired, limited or restricted    Time: 1051-1105 PT Time Calculation (min) (ACUTE ONLY): 14 min   Charges:   PT Evaluation $PT Eval Moderate Complexity: 1 Procedure     PT G Codes:   PT G-Codes **NOT FOR INPATIENT CLASS** Functional Assessment Tool Used: clinical judgement Functional Limitation: Mobility: Walking and moving around Mobility: Walking and Moving Around Current Status (N9987): At least 1 percent but less than 20 percent impaired, limited or restricted Mobility: Walking and Moving Around Goal Status [phone]): At least 1 percent but less than 20 percent impaired, limited or restricted Mobility: Walking and Moving Around Discharge Status  [phone]): At least 1 percent but less than 20 percent impaired, limited or restricted    Scheryl Marten PT, DPT  [phone]  03/28/2016, 1:25 PM

## 2016-04-02 ENCOUNTER — Ambulatory Visit (INDEPENDENT_AMBULATORY_CARE_PROVIDER_SITE_OTHER): Payer: Self-pay | Admitting: Orthopaedic Surgery

## 2016-04-02 ENCOUNTER — Encounter (INDEPENDENT_AMBULATORY_CARE_PROVIDER_SITE_OTHER): Payer: Self-pay | Admitting: Orthopaedic Surgery

## 2016-04-02 VITALS — BP 143/90 | HR 90 | Ht 73.5 in | Wt 197.0 lb

## 2016-04-02 DIAGNOSIS — Z9889 Other specified postprocedural states: Secondary | ICD-10-CM

## 2016-04-02 MED ORDER — OXYCODONE-ACETAMINOPHEN 5-325 MG PO TABS: 1.0000 | ORAL_TABLET | Freq: Four times a day (QID) | ORAL | 0 refills | Status: DC | PRN

## 2016-04-02 NOTE — Progress Notes (Signed)
Post-Op Visit Note   Patient: James [name]           Date of Birth: 1960-05-09           MRN: [id number] Visit Date: 04/02/2016 PCP: No PCP Per Patient   Assessment & Plan:  Chief Complaint:  Chief Complaint  Patient presents with  . Right Knee - Routine Post Op   Visit Diagnoses:  1. Status post tendon repair     Plan: Patient will follow-up the office in 1 week for staple removal. Advised patient that he must wear his knee immobilizer at all times except for when he is showering. Elevate his foot as much as possible to help decrease pain and swelling. He does admit to driving and I advised that he has been driving and I advised against this. Also states that he has returned back to work against our advice and advised that he must take it easy until we see him back in the clinic. Understands the risk of reinjury and also the risk of getting a DUI/DWI if he is driving. Voices understanding. Refilled Percocet.  Follow-Up Instructions: Return in about 1 week (around 04/09/2016).   Orders:  No orders of the defined types were placed in this encounter.  Meds ordered this encounter  Medications  . oxyCODONE-acetaminophen (PERCOCET/ROXICET) 5-325 MG tablet    Sig: Take 1 tablet by mouth every 6 (six) hours as needed for severe pain.    Dispense:  50 tablet    Refill:  0   HPI Patient returns for first post op visit. He is status post right patella tendon repair on 03/27/2016. He is 6 days post op. He is doing okay. He states that he is sore. He is nonweightbearing on crutches. He states that he wears his immobilizer all of the time. He is taking oxycodone and methocarbamol with relief. He requests refill.  Exam:  Wound healing well without signs of infection. Staples are intact. No drainage. Does have some knee swelling although not extreme. Nontender. Neurovascularly intact. Skin warm and dry. Imaging: No results found.  PMFS History: Patient Active Problem List   Diagnosis  Date Noted  . Patellar tendon rupture 03/27/2016  . Thrombosis of saphenous vein, left 11/28/2015  . Alcohol induced acute pancreatitis 11/23/2015  . Elevated lipase 11/23/2015  . Pancreatitis 11/23/2015  . Acute pancreatitis 11/23/2015  . Renal mass 02/15/2015  . Renal cell carcinoma ([place]) 02/15/2015  . Cellulitis and abscess of leg, left 07/14/2012  . GERD (gastroesophageal reflux disease) 07/14/2012  . Alcohol dependence ([place]) 05/25/2012   Past Medical History:  Diagnosis Date  . Arthritis    right knee right wrist   . Chronic kidney disease   . GERD (gastroesophageal reflux disease)   . Hypertension    pt states was treated with medication back in 2000; currently on no medication   . Left renal mass   . Melanoma ([place]) dx'd 46yrs ago age 40   rt lat chest wall--surg only  . Renal cancer ([place]) dx'd 01/2015   left nephrectomy  . Wears glasses     Family History  Problem Relation Age of Onset  . Lung cancer Mother     Past Surgical History:  Procedure Laterality Date  . ABDOMINAL SURGERY  2017   remove tumor cyst kidney  . COLONOSCOPY    . CYSTOSCOPY WITH RETROGRADE PYELOGRAM, URETEROSCOPY AND STENT PLACEMENT Left 07/19/2015   Procedure: LEFT RETROGRADE PYELOGRAM, LEFT URETEROSCOPY AND LEFT URETERAL STENT PLACEMENT;  Surgeon: Ardis Hughs, MD;  Location: [facility];  Service: Urology;  Laterality: Left;  . drained cyst      left kidney 10 months ago   . lymph nodes removed      right armpit secondary to melanoma   . NEPHRECTOMY Left 02/15/2015   Procedure: LEFT OPEN PARTIAL NEPHRECTOMY;  Surgeon: Ardis Hughs, MD;  Location: [facility];  Service: Urology;  Laterality: Left;  . PATELLAR TENDON REPAIR Right 03/27/2016   Procedure: RIGHT PATELLA TENDON REPAIR;  Surgeon: Marybelle Killings, MD;  Location: [facility];  Service: Orthopedics;  Laterality: Right;  . right armpit surgery     for melanoma   Social History   Occupational History  . Not on file.   Social History  Main Topics  . Smoking status: Current Every Day Smoker    Packs/day: 0.33    Years: 20.00    Types: Cigarettes  . Smokeless tobacco: Never Used  . Alcohol use No     Comment: no alcohol since 11/22/2015  . Drug use: No  . Sexual activity: No

## 2016-04-04 NOTE — Discharge Summary (Signed)
Patient ID: James [name] MRN: [id number] DOB/AGE: 56-02-1960 56 y.o.  Admit date: 03/27/2016 Discharge date: 04/04/2016  Admission Diagnoses:  Active Problems:   Patellar tendon rupture   Discharge Diagnoses:  Active Problems:   Patellar tendon rupture  status post Procedure(s): RIGHT PATELLA TENDON REPAIR  Past Medical History:  Diagnosis Date  . Arthritis    right knee right wrist   . Chronic kidney disease   . GERD (gastroesophageal reflux disease)   . Hypertension    pt states was treated with medication back in 2000; currently on no medication   . Left renal mass   . Melanoma ([place]) dx'd 57yrs ago age 59   rt lat chest wall--surg only  . Renal cancer ([place]) dx'd 01/2015   left nephrectomy  . Wears glasses     Surgeries: Procedure(s): RIGHT PATELLA TENDON REPAIR on 03/27/2016   Consultants:   Discharged Condition: Improved  Hospital Course: James [name] is an 56 y.o. male who was admitted 03/27/2016 for operative treatment of patella tendon rupture. Patient failed conservative treatments (please see the history and physical for the specifics) and had severe unremitting pain that affects sleep, daily activities and work/hobbies. After pre-op clearance, the patient was taken to the operating room on 03/27/2016 and underwent  Procedure(s): [place].    Patient was given perioperative antibiotics:  Anti-infectives    Start     Dose/Rate Route Frequency Ordered Stop   03/27/16 1245  ceFAZolin (ANCEF) IVPB 2g/100 mL premix  Status:  Discontinued     2 g 200 mL/hr over 30 Minutes Intravenous On call to O.R. 03/27/16 1221 03/27/16 1943   03/27/16 1231  ceFAZolin (ANCEF) 2-4 GM/100ML-% IVPB    Comments:  Precious Haws   : cabinet override      03/27/16 1231 03/28/16 0044       Patient was given sequential compression devices and early ambulation to prevent DVT.   Patient benefited maximally from hospital stay and there were no  complications. At the time of discharge, the patient was urinating/moving their bowels without difficulty, tolerating a regular diet, pain is controlled with oral pain medications and they have been cleared by PT/OT.   Recent vital signs: No data found.    Recent laboratory studies: No results for input(s): WBC, HGB, HCT, PLT, NA, K, CL, CO2, BUN, CREATININE, GLUCOSE, INR, CALCIUM in the last 72 hours.  Invalid input(s): PT, 2   Discharge Medications:   Allergies as of 03/28/2016      Reactions   Sulfa Antibiotics Swelling      Medication List    STOP taking these medications   cyclobenzaprine 10 MG tablet Commonly known as:  FLEXERIL     TAKE these medications   methocarbamol 500 MG tablet Commonly known as:  ROBAXIN Take 1 tablet (500 mg total) by mouth every 6 (six) hours as needed for muscle spasms.   multivitamin with minerals Tabs tablet Take 1 tablet by mouth daily.   omeprazole 20 MG capsule Commonly known as:  PRILOSEC Take 1 capsule (20 mg total) by mouth 2 (two) times daily before a meal. What changed:  when to take this  additional instructions   oxyCODONE-acetaminophen 5-325 MG tablet Commonly known as:  ROXICET Take 1-2 tablets by mouth every 4 (four) hours as needed for severe pain. What changed:  how much to take  when to take this       Diagnostic Studies: Dg Knee Right Port  Result Date: 03/22/2016 CLINICAL DATA:  Acute onset of right knee pain.  Initial encounter. EXAM: PORTABLE RIGHT KNEE - 1-2 VIEW COMPARISON:  Right knee radiographs performed 12/25/2003 FINDINGS: There is no evidence of fracture or dislocation. The joint spaces are preserved. No significant degenerative change is seen; the patellofemoral joint is grossly unremarkable in appearance. Apparent patella alta is noted, new from 2005. Would correlate for any evidence of patellar tendon injury. No significant joint effusion is seen. The visualized soft tissues are normal in  appearance. IMPRESSION: 1. No evidence of fracture or dislocation. 2. Apparent patella alta noted, new from 2005. Would correlate for any evidence of patellar tendon injury. Electronically Signed   By: Garald Balding M.D.   On: 03/22/2016 17:56      Follow-up Information    Marybelle Killings, MD Follow up in 1 week(s).   Specialty:  Orthopedic Surgery Contact information: [place] [address] [phone]        Marybelle Killings, MD .   Specialty:  Orthopedic Surgery Contact information: [address] [place] [address] [phone]           Discharge Plan:  discharge to home  Disposition:     Signed: Benjiman Core 04/04/2016, 10:14 AM

## 2016-04-10 ENCOUNTER — Encounter (INDEPENDENT_AMBULATORY_CARE_PROVIDER_SITE_OTHER): Payer: Self-pay | Admitting: Surgery

## 2016-04-10 ENCOUNTER — Ambulatory Visit (INDEPENDENT_AMBULATORY_CARE_PROVIDER_SITE_OTHER): Payer: Self-pay | Admitting: Surgery

## 2016-04-10 ENCOUNTER — Ambulatory Visit (INDEPENDENT_AMBULATORY_CARE_PROVIDER_SITE_OTHER): Payer: Self-pay

## 2016-04-10 VITALS — BP 151/102 | HR 118 | Ht 73.25 in | Wt 197.0 lb

## 2016-04-10 DIAGNOSIS — Z9889 Other specified postprocedural states: Secondary | ICD-10-CM

## 2016-04-10 MED ORDER — HYDROCODONE-ACETAMINOPHEN 10-325 MG PO TABS: 1.0000 | ORAL_TABLET | Freq: Four times a day (QID) | ORAL | 0 refills | Status: DC | PRN

## 2016-04-10 MED ORDER — METHOCARBAMOL 500 MG PO TABS: 500.0000 mg | ORAL_TABLET | Freq: Three times a day (TID) | ORAL | 0 refills | Status: DC | PRN

## 2016-04-10 NOTE — Progress Notes (Signed)
Post-Op Visit Note   Patient: James [name]           Date of Birth: 08-Dec-1960           MRN: [id number] Visit Date: 04/10/2016 PCP: No PCP Per Patient   Assessment & Plan:  Chief Complaint:  James [name] is 2 weeks post right patella tendon repair.  He states that he is sore. States that he wants to return back to work. Needs refill of pain medication and muscle relaxer. Visit Diagnoses:  1. Status post tendon repair     Plan: Today the staples were removed and Steri-Strips applied. Must continue wearing the knee immobilizer at all times except when showering. Stressed to him the importance of not bending his knee.  He is wanted to return back to work and was given a note for light duty with restrictions.  Follow-up with Dr. Lorin Mercy in 3 weeks for recheck. Understands also that he should not be driving.  Follow-Up Instructions: No Follow-up on file.   Orders:  Orders Placed This Encounter  Procedures  . XR Knee 1-2 Views Right   No orders of the defined types were placed in this encounter.   Imaging: No results found.  PMFS History: Patient Active Problem List   Diagnosis Date Noted  . Patellar tendon rupture 03/27/2016  . Thrombosis of saphenous vein, left 11/28/2015  . Alcohol induced acute pancreatitis 11/23/2015  . Elevated lipase 11/23/2015  . Pancreatitis 11/23/2015  . Acute pancreatitis 11/23/2015  . Renal mass 02/15/2015  . Renal cell carcinoma ([place]) 02/15/2015  . Cellulitis and abscess of leg, left 07/14/2012  . GERD (gastroesophageal reflux disease) 07/14/2012  . Alcohol dependence ([employer]) 05/25/2012   Past Medical History:  Diagnosis Date  . Arthritis    right knee right wrist   . Chronic kidney disease   . GERD (gastroesophageal reflux disease)   . Hypertension    pt states was treated with medication back in 2000; currently on no medication   . Left renal mass   . Melanoma ([place]) dx'd 2yrs ago age 54   rt lat chest wall--surg only  . Renal  cancer ([place] [facility]) dx'd 01/2015   left nephrectomy  . Wears glasses     Family History  Problem Relation Age of Onset  . Lung cancer Mother     Past Surgical History:  Procedure Laterality Date  . ABDOMINAL SURGERY  2017   remove tumor cyst kidney  . COLONOSCOPY    . CYSTOSCOPY WITH RETROGRADE PYELOGRAM, URETEROSCOPY AND STENT PLACEMENT Left 07/19/2015   Procedure: LEFT RETROGRADE PYELOGRAM, LEFT URETEROSCOPY AND LEFT URETERAL STENT PLACEMENT;  Surgeon: Ardis Hughs, MD;  [facility];  Service: Urology;  Laterality: Left;  . drained cyst      left kidney 10 months ago   . lymph nodes removed      right armpit secondary to melanoma   . NEPHRECTOMY Left 02/15/2015   Procedure: LEFT OPEN PARTIAL NEPHRECTOMY;  Surgeon: Ardis Hughs, MD;  [facility];  Service: Urology;  Laterality: Left;  . PATELLAR TENDON REPAIR Right 03/27/2016   Procedure: RIGHT PATELLA TENDON REPAIR;  Surgeon: Marybelle Killings, MD;  Location: [facility];  Service: Orthopedics;  Laterality: Right;  . right armpit surgery     for melanoma   Social History   Occupational History  . Not on file.   Social History Main Topics  . Smoking status: Current Every Day Smoker    Packs/day: 0.33  Years: 20.00    Types: Cigarettes  . Smokeless tobacco: Never Used  . Alcohol use No     Comment: no alcohol since 11/22/2015  . Drug use: No  . Sexual activity: No   Exam Pleasant white male alert and oriented and in no acute distress. Knee wound looks good. Staples removed and Steri-Strips applied. No drainage or signs of infection. Calf nontender. Patella sitting in good position.

## 2016-04-24 ENCOUNTER — Telehealth (INDEPENDENT_AMBULATORY_CARE_PROVIDER_SITE_OTHER): Payer: Self-pay | Admitting: *Deleted

## 2016-04-24 MED ORDER — METHOCARBAMOL 500 MG PO TABS: 500.0000 mg | ORAL_TABLET | Freq: Four times a day (QID) | ORAL | 0 refills | Status: DC | PRN

## 2016-04-24 MED ORDER — HYDROCODONE-ACETAMINOPHEN 10-325 MG PO TABS: 1.0000 | ORAL_TABLET | Freq: Four times a day (QID) | ORAL | 0 refills | Status: DC | PRN

## 2016-04-24 NOTE — Telephone Encounter (Signed)
Rx's done per Dr Marlou Sa, I called and advised patient.

## 2016-04-24 NOTE — Telephone Encounter (Signed)
Pt needs refill of muscle relaxer and hydrocodone 10-325

## 2016-04-24 NOTE — Telephone Encounter (Signed)
Ok to do per Dr Marlou Sa

## 2016-04-24 NOTE — Telephone Encounter (Signed)
Can you advise since Dr. Lorin Mercy is in surgery? Patient had patella tendon repair on 1/31. He last received Hydrocodone 10-325 #50 on 04/10/2016. Thanks.

## 2016-05-01 ENCOUNTER — Ambulatory Visit (INDEPENDENT_AMBULATORY_CARE_PROVIDER_SITE_OTHER): Payer: Self-pay

## 2016-05-01 ENCOUNTER — Ambulatory Visit (INDEPENDENT_AMBULATORY_CARE_PROVIDER_SITE_OTHER): Payer: Self-pay | Admitting: Surgery

## 2016-05-01 ENCOUNTER — Encounter (INDEPENDENT_AMBULATORY_CARE_PROVIDER_SITE_OTHER): Payer: Self-pay | Admitting: Surgery

## 2016-05-01 VITALS — Ht 73.25 in | Wt 197.0 lb

## 2016-05-01 DIAGNOSIS — Z9889 Other specified postprocedural states: Secondary | ICD-10-CM

## 2016-05-01 MED ORDER — HYDROCODONE-ACETAMINOPHEN 5-325 MG PO TABS: 1.0000 | ORAL_TABLET | Freq: Four times a day (QID) | ORAL | 0 refills | Status: DC | PRN

## 2016-05-01 MED FILL — HYDROCODON-APAP 5-325: 5-325 | 15 days supply | Qty: 60 | Fill #0

## 2016-05-01 NOTE — Progress Notes (Signed)
Office Visit Note   Patient: James [name]           Date of Birth: Aug 04, 1960           MRN: [id number] Visit Date: 05/01/2016              Requested by: No referring provider defined for this encounter. PCP: No PCP Per Patient   Assessment & Plan: Visit Diagnoses:  1. Status post tendon repair     Plan: Patient will continue wearing the immobilizer. I asked him to give [place] a call first thing next week and I will talk to Dr. Lorin Mercy about possibly starting formal PT. I advised patient that he should not be bending his knee. No aggressive activity. He voices understanding.  Follow-Up Instructions: Return in about 3 weeks (around 05/22/2016) for [place].   Orders:  Orders Placed This Encounter  Procedures  . XR Knee 1-2 Views Right   Meds ordered this encounter  Medications  . HYDROcodone-acetaminophen (NORCO/VICODIN) 5-325 MG tablet    Sig: Take 1 tablet by mouth every 6 (six) hours as needed for moderate pain.    Dispense:  60 tablet    Refill:  0      Procedures: No procedures performed   Clinical Data: No additional findings.   Subjective: Chief Complaint  Patient presents with  . Right Knee - Follow-up, Routine Post Op    Mr. Cordner is 6 weeks post right patella tendon repair. When he got out of bed last night the knee popped. He states that it is sore today.  Overall it has been doing good.  He states that he wants to get back to work as soon as possible     Review of Systems  Gastrointestinal: Negative.   Genitourinary: Negative.   Musculoskeletal: Positive for gait problem and joint swelling.  Psychiatric/Behavioral: Negative.      Objective: Vital Signs: Ht 6' 1.25" (1.861 m)   Wt 197 lb (89.4 kg)   BMI 25.81 kg/m   Physical Exam  Constitutional: He is oriented to person, place, and time. No distress.  HENT:  Head: Normocephalic and atraumatic.  Eyes: EOM are normal.  Musculoskeletal:  Surgical incision right knee has  healed well. Minimal knee swelling. Patella sitting in good position. Calf Nontender and he is neurovascularly intact. Skin warm and dry.  Neurological: He is alert and oriented to person, place, and time.    Ortho Exam  Specialty Comments:  No specialty comments available.  Imaging: No results found.   PMFS History: Patient Active Problem List   Diagnosis Date Noted  . Patellar tendon rupture 03/27/2016  . Thrombosis of saphenous vein, left 11/28/2015  . Alcohol induced acute pancreatitis 11/23/2015  . Elevated lipase 11/23/2015  . Pancreatitis 11/23/2015  . Acute pancreatitis 11/23/2015  . Renal mass 02/15/2015  . Renal cell carcinoma ([facility]) 02/15/2015  . Cellulitis and abscess of leg, left 07/14/2012  . GERD (gastroesophageal reflux disease) 07/14/2012  . Alcohol dependence ([place]) 05/25/2012   Past Medical History:  Diagnosis Date  . Arthritis    right knee right wrist   . Chronic kidney disease   . GERD (gastroesophageal reflux disease)   . Hypertension    pt states was treated with medication back in 2000; currently on no medication   . Left renal mass   . Melanoma ([facility]) dx'd 74yrs ago age 49   rt lat chest wall--surg only  . Renal cancer ([facility]) dx'd 01/2015  left nephrectomy  . Wears glasses     Family History  Problem Relation Age of Onset  . Lung cancer Mother     Past Surgical History:  Procedure Laterality Date  . ABDOMINAL SURGERY  2017   remove tumor cyst kidney  . COLONOSCOPY    . CYSTOSCOPY WITH RETROGRADE PYELOGRAM, URETEROSCOPY AND STENT PLACEMENT Left 07/19/2015   Procedure: LEFT RETROGRADE PYELOGRAM, LEFT URETEROSCOPY AND LEFT URETERAL STENT PLACEMENT;  Surgeon: Ardis Hughs, MD;  [facility];  Service: Urology;  Laterality: Left;  . drained cyst      left kidney 10 months ago   . lymph nodes removed      right armpit secondary to melanoma   . NEPHRECTOMY Left 02/15/2015   Procedure: LEFT OPEN PARTIAL NEPHRECTOMY;  Surgeon: Ardis Hughs, MD;  [facility];  Service: Urology;  Laterality: Left;  . PATELLAR TENDON REPAIR Right 03/27/2016   Procedure: RIGHT PATELLA TENDON REPAIR;  Surgeon: Marybelle Killings, MD;  Location: [facility];  Service: Orthopedics;  Laterality: Right;  . right armpit surgery     for melanoma   Social History   Occupational History  . Not on file.   Social History Main Topics  . Smoking status: Current Every Day Smoker    Packs/day: 0.33    Years: 20.00    Types: Cigarettes  . Smokeless tobacco: Never Used  . Alcohol use No     Comment: no alcohol since 11/22/2015  . Drug use: No  . Sexual activity: No

## 2016-05-06 ENCOUNTER — Telehealth (INDEPENDENT_AMBULATORY_CARE_PROVIDER_SITE_OTHER): Payer: Self-pay | Admitting: Orthopaedic Surgery

## 2016-05-06 DIAGNOSIS — S86811D Strain of other muscle(s) and tendon(s) at lower leg level, right leg, subsequent encounter: Secondary | ICD-10-CM

## 2016-05-06 NOTE — Telephone Encounter (Signed)
Patient called asked for a call back concerning him being set up for (PT)  The number to contact patient is [phone]

## 2016-05-06 NOTE — Telephone Encounter (Signed)
Status post right patella tendon repair 03/27/2016. Jeneen Rinks saw patient on 05/01/2016. Note is not available yet. Would you like patient to go to outpatient PT?

## 2016-05-07 ENCOUNTER — Telehealth (INDEPENDENT_AMBULATORY_CARE_PROVIDER_SITE_OTHER): Payer: Self-pay | Admitting: Orthopaedic Surgery

## 2016-05-07 NOTE — Addendum Note (Signed)
Addended by: Meyer Cory on: 05/07/2016 04:43 PM   Modules accepted: Orders

## 2016-05-07 NOTE — Telephone Encounter (Signed)
Entered into system and patient given order for PT

## 2016-05-07 NOTE — Telephone Encounter (Signed)
S/P right patella tendon repair 03/27/2016. OK for light duty?

## 2016-05-07 NOTE — Telephone Encounter (Signed)
OK proceed with outpt PT thanks

## 2016-05-07 NOTE — Telephone Encounter (Signed)
Ok thanks 

## 2016-05-07 NOTE — Telephone Encounter (Signed)
Note written and given to patient. Must wear knee immobilizer at all times.

## 2016-05-07 NOTE — Telephone Encounter (Signed)
Patient called asked if he can get a note stating he can return to work on (light duty). Patient said he can pick up note today. The number to contact patient is [phone]

## 2016-05-10 ENCOUNTER — Telehealth (INDEPENDENT_AMBULATORY_CARE_PROVIDER_SITE_OTHER): Payer: Self-pay | Admitting: Orthopaedic Surgery

## 2016-05-10 ENCOUNTER — Ambulatory Visit: Payer: Self-pay | Attending: Orthopaedic Surgery | Admitting: Physical Therapy

## 2016-05-10 DIAGNOSIS — M25661 Stiffness of right knee, not elsewhere classified: Secondary | ICD-10-CM | POA: Insufficient documentation

## 2016-05-10 DIAGNOSIS — R262 Difficulty in walking, not elsewhere classified: Secondary | ICD-10-CM | POA: Insufficient documentation

## 2016-05-10 DIAGNOSIS — M6281 Muscle weakness (generalized): Secondary | ICD-10-CM | POA: Insufficient documentation

## 2016-05-10 NOTE — Therapy (Signed)
[place], [place], [address] Phone: [phone]   Fax:  [phone]  Physical Therapy Evaluation  Patient Details  Name: James [name] MRN: [id number] Date of Birth: April 28, 1960 Referring Provider: Dr. Rodell Perna  Encounter Date: 05/10/2016      PT End of Session - 05/10/16 1216    Visit Number 1   Number of Visits 16   Date for PT Re-Evaluation 07/05/16   PT Start Time 0930   PT Stop Time 4742   PT Time Calculation (min) 44 min   Activity Tolerance Patient tolerated treatment well   Behavior During Therapy [facility] for tasks assessed/performed      Past Medical History:  Diagnosis Date  . Arthritis    right knee right wrist   . Chronic kidney disease   . GERD (gastroesophageal reflux disease)   . Hypertension    pt states was treated with medication back in 2000; currently on no medication   . Left renal mass   . Melanoma ([place]) dx'd 59yrs ago age 19   rt lat chest wall--surg only  . Renal cancer ([place]) dx'd 01/2015   left nephrectomy  . Wears glasses     Past Surgical History:  Procedure Laterality Date  . ABDOMINAL SURGERY  2017   remove tumor cyst kidney  . COLONOSCOPY    . CYSTOSCOPY WITH RETROGRADE PYELOGRAM, URETEROSCOPY AND STENT PLACEMENT Left 07/19/2015   Procedure: LEFT RETROGRADE PYELOGRAM, LEFT URETEROSCOPY AND LEFT URETERAL STENT PLACEMENT;  Surgeon: Ardis Hughs, MD;  [facility];  Service: Urology;  Laterality: Left;  . drained cyst      left kidney 10 months ago   . lymph nodes removed      right armpit secondary to melanoma   . NEPHRECTOMY Left 02/15/2015   Procedure: LEFT OPEN PARTIAL NEPHRECTOMY;  Surgeon: Ardis Hughs, MD;  [facility];  Service: Urology;  Laterality: Left;  . PATELLAR TENDON REPAIR Right 03/27/2016   Procedure: RIGHT PATELLA TENDON REPAIR;  Surgeon: Marybelle Killings, MD;  Location: [place];  Service: Orthopedics;  Laterality: Right;  .  right armpit surgery     for melanoma    There were no vitals filed for this visit.       Subjective Assessment - 05/10/16 0939    Subjective Pt injured his Rt. knee stepping down from a ladder and ruptured his patellar tendon.  He underwent surgery 03/27/16.  No complications.  04/10/16 was allowed with walk with a crutch, took out stitiches.  On 3/7 he was given OK to return to work with Knee immobilizer on at all times.  He is doign well overall but unable to perform full duties at work normally and has difficulty with transfers in and out of a car, bed, chair.    Pertinent History none relevant    Limitations Lifting;Standing;Walking;House hold activities;Other (comment)  work, sleep    Diagnostic tests XR done 05/01/16.    Patient Stated Goals Return to normal activities unrestricted.  Wants to avoid another surgery so he is being very cautious.    Currently in Pain? Yes   Pain Score 3    Pain Location Knee   Pain Orientation Right;Anterior   Pain Descriptors / Indicators Burning;Sore   Pain Type Surgical pain   Pain Radiating Towards no    Pain Onset More than a month ago   Pain Frequency Intermittent   Aggravating Factors  walking , standing, steps  Pain Relieving Factors Ibuprofen, does not ice, uses KI at night             [facility] PT Assessment - 05/10/16 0946      Assessment   Medical Diagnosis Rt. patella tendon repair    Referring Provider Dr. Rodell Perna   Onset Date/Surgical Date 03/27/16   Prior Therapy No      Precautions   Required Braces or Orthoses Knee Immobilizer - Right   Knee Immobilizer - Right On at all times  wears to bed, takes off for ther ex      Balance Screen   Has the patient fallen in the past 6 months Yes   How many times? 1  injury   Has the patient had a decrease in activity level because of a fear of falling?  Yes  no follow up needed    Is the patient reluctant to leave their home because of a fear of falling?  No     Home  [place] residence     Prior Function   Level of Independence Independent     Observation/Other Assessments-Edema    Edema Circumferential     Circumferential Edema   Circumferential - Right 16 inch    Circumferential - Left  15 inch      Sensation   Light Touch Appears Intact     AROM   Right Knee Extension 4   Right Knee Flexion 48  AAROM to 60 deg    Left Knee Extension 0   Left Knee Flexion 136     Strength   Right Hip Flexion 5/5   Left Hip Flexion 5/5   Right Knee Flexion 4/5   Right Knee Extension 3+/5  or more, did not push hard due to surgery   Left Knee Flexion 5/5   Left Knee Extension 5/5     Palpation   Patella mobility hypomobile, tender   Palpation comment warm but not swollen                   [facility] Adult PT Treatment/Exercise - 05/10/16 0001      Self-Care   Self-Care RICE;Scar Mobilizations;Heat/Ice Application;Other Self-Care Comments   RICE ice post ex    Scar Mobilizations patellar   Other Self-Care Comments  HEP and eval findings      Knee/Hip Exercises: Stretches   Knee: Self-Stretch to increase Flexion Right;5 reps     Knee/Hip Exercises: Supine   Quad Sets AROM;Strengthening;Right;1 set   Heel Slides AAROM;Right;1 set   Straight Leg Raises Strengthening;Right;1 set;10 reps                PT Education - 05/10/16 1215    Education provided Yes   Education Details PT, ROM, HEP, RICE    Person(s) Educated Patient   Methods Explanation;Demonstration;Tactile cues;Verbal cues;Handout   Comprehension Verbalized understanding;Returned demonstration;Verbal cues required;Need further instruction          PT Short Term Goals - 05/10/16 1219      PT SHORT TERM GOAL #1   Title Pt will be I with initial HEP for knee AROM    Time 4   Period Weeks   Status New     PT SHORT TERM GOAL #2   Title Pt will achieve 90 deg knee flexion for proper sitting and transfers.    Baseline 60 deg  AAROM , 48 AROM    Time 4   Status New  PT SHORT TERM GOAL #3   Title Pt will be able to walk without Knee immobilizer brace as MD allows , LRAD    Time 8   Period Weeks   Status New           PT Long Term Goals - 05/10/16 1220      PT LONG TERM GOAL #1   Title Pt will be able to walk as needed for work without AD and pain <3/10 most of the time.    Time 8   Period Weeks   Status New     PT LONG TERM GOAL #2   Title Pt will demo 5/5 knee strength for maximal support with ambulation, stairs.     Time 8   Period Weeks   Status New     PT LONG TERM GOAL #3   Title Pt will negotiate 12+ stairs safely with 1 rail and min pain increase.    Time 8   Period Weeks   Status New     PT LONG TERM GOAL #4   Title Pt will be able to get in and out of the car without difficulty    Time 8   Period Weeks   Status New     PT LONG TERM GOAL #5   Title Pt will be I with more advanced HEP    Time 8   Period Weeks   Status New               Plan - 05/10/16 1216    Clinical Impression Statement Pt presents for low complexity eval of Rt. knee post patellar tendon repair.  He lacks Rt. knee strength and ROM (60 deg AAROM today in supine.  He can perform a SLR with < 10 deg quad lag.  He will do well as he is motivated and compliant.    Rehab Potential Excellent   PT Frequency 2x / week   PT Duration 8 weeks   PT Treatment/Interventions ADLs/Self Care Home Management;Therapeutic activities;Therapeutic exercise;Manual techniques;Taping;Neuromuscular re-education;Cryotherapy;Electrical Stimulation;Functional mobility training;Stair training;Gait training;Patient/family education;Passive range of motion   PT Next Visit Plan ROM, strength .  Sees IF MD sent protocol.  Open chain.  Check HEP, try NUstep as tol.    PT Home Exercise Plan level 1 knee    Consulted and Agree with Plan of Care Patient      Patient will benefit from skilled therapeutic intervention in order to  improve the following deficits and impairments:  Decreased range of motion, Difficulty walking, Pain, Hypomobility, Decreased scar mobility, Decreased mobility, Decreased strength, Increased edema, Impaired flexibility  Visit Diagnosis: Stiffness of right knee, not elsewhere classified  Difficulty in walking, not elsewhere classified  Muscle weakness (generalized)     Problem List Patient Active Problem List   Diagnosis Date Noted  . Patellar tendon rupture 03/27/2016  . Thrombosis of saphenous vein, left 11/28/2015  . Alcohol induced acute pancreatitis 11/23/2015  . Elevated lipase 11/23/2015  . Pancreatitis 11/23/2015  . Acute pancreatitis 11/23/2015  . Renal mass 02/15/2015  . Renal cell carcinoma ([place]) 02/15/2015  . Cellulitis and abscess of leg, left 07/14/2012  . GERD (gastroesophageal reflux disease) 07/14/2012  . Alcohol dependence ([place]) 05/25/2012    PAA,JENNIFER 05/10/2016, 12:29 PM  [facility] [address] [place], [place], [address] Phone: [phone]   Fax:  [phone]  Name: James [name] MRN: [id number] Date of Birth: 06-22-1960   Raeford Razor, PT 05/10/16 12:29 PM  Phone: [phone] Fax: [phone]

## 2016-05-10 NOTE — Telephone Encounter (Signed)
OK for ROM knee and quad strengthening.   Gradually progress with flexion and strengthening. Easier to do terminal arc quad strengthening should be better tolerated and work on passive and AAROM flexion . ucall thanks

## 2016-05-10 NOTE — Patient Instructions (Signed)
   Copyright  VHI. All rights reserved.  HIP: Flexion / KNEE: Extension, Straight Leg Raise   Raise leg, keeping knee straight. Perform slowly. _10__ reps per set, _3-5__ sets per day, _7__ days per week   Copyright  VHI. All rights reserved.  Heel Slide   Bend knee and pull heel toward buttocks. Hold __20-30__ seconds. Return. Repeat with other knee. Repeat __5__ times. Do __3__ sessions per day. Use a sheet to assist pulling your knee into flexion (bending)   http://gt2.exer.us/372   Copyright  VHI. All rights reserved.     Raise leg until knee is straight. _10-20__ reps per set, _2-3__ sets per day, __7_ days per week  Copyright  VHI. All rights reserved.  Short Arc [employer] a large can or rolled towel under leg. Straighten knee and leg. Hold _5___ seconds. Repeat with other leg. Repeat __10-20__ times. Do _2-3___ sessions per day.  http://gt2.exer.us/365   Copyright  VHI. All rights reserved.  Quad Set   Slowly tighten muscles on thigh of straight leg while counting out loud to _5___. Repeat with other leg. Repeat __10-20__ times. Do _3-5___ sessions per day.  http://gt2.exer.us/361   Copyright  VHI. All rights reserved.

## 2016-05-10 NOTE — Telephone Encounter (Signed)
James [name] from [facility] out patient called asking if the patient has any range of motion restrictions and if you have any protocols for the patient if you could fax those over. CB # [phone] Fax # [phone]

## 2016-05-10 NOTE — Telephone Encounter (Signed)
Please advise. S/P patella tendon repair 03/27/2016.

## 2016-05-10 NOTE — Telephone Encounter (Signed)
Information put onto script and faxed to [facility].

## 2016-05-17 ENCOUNTER — Ambulatory Visit: Payer: Self-pay | Admitting: Physical Therapy

## 2016-05-17 ENCOUNTER — Telehealth (INDEPENDENT_AMBULATORY_CARE_PROVIDER_SITE_OTHER): Payer: Self-pay | Admitting: *Deleted

## 2016-05-17 DIAGNOSIS — M6281 Muscle weakness (generalized): Secondary | ICD-10-CM

## 2016-05-17 DIAGNOSIS — M25661 Stiffness of right knee, not elsewhere classified: Secondary | ICD-10-CM

## 2016-05-17 DIAGNOSIS — R262 Difficulty in walking, not elsewhere classified: Secondary | ICD-10-CM

## 2016-05-17 NOTE — Therapy (Signed)
[place], [place], [address] Phone: [phone]   Fax:  [phone]  Physical Therapy Treatment  Patient Details  Name: James [name] MRN: [id number] Date of Birth: 1960/05/21 Referring Provider: Dr. Rodell Perna  Encounter Date: 05/17/2016      PT End of Session - 05/17/16 0857    Visit Number 2   Number of Visits 16   Date for PT Re-Evaluation 07/05/16   PT Start Time 0808   PT Stop Time 0901   PT Time Calculation (min) 53 min   Activity Tolerance Patient tolerated treatment well   Behavior During Therapy [facility] for tasks assessed/performed      Past Medical History:  Diagnosis Date  . Arthritis    right knee right wrist   . Chronic kidney disease   . GERD (gastroesophageal reflux disease)   . Hypertension    pt states was treated with medication back in 2000; currently on no medication   . Left renal mass   . Melanoma ([place]) dx'd 61yrs ago age 56   rt lat chest wall--surg only  . Renal cancer ([place]) dx'd 01/2015   left nephrectomy  . Wears glasses     Past Surgical History:  Procedure Laterality Date  . ABDOMINAL SURGERY  2017   remove tumor cyst kidney  . COLONOSCOPY    . CYSTOSCOPY WITH RETROGRADE PYELOGRAM, URETEROSCOPY AND STENT PLACEMENT Left 07/19/2015   Procedure: LEFT RETROGRADE PYELOGRAM, LEFT URETEROSCOPY AND LEFT URETERAL STENT PLACEMENT;  Surgeon: Ardis Hughs, MD;  [facility];  Service: Urology;  Laterality: Left;  . drained cyst      left kidney 10 months ago   . lymph nodes removed      right armpit secondary to melanoma   . NEPHRECTOMY Left 02/15/2015   Procedure: LEFT OPEN PARTIAL NEPHRECTOMY;  Surgeon: Ardis Hughs, MD;  [facility];  Service: Urology;  Laterality: Left;  . PATELLAR TENDON REPAIR Right 03/27/2016   Procedure: RIGHT PATELLA TENDON REPAIR;  Surgeon: Marybelle Killings, MD;  Location: [place];  Service: Orthopedics;  Laterality: Right;  .  right armpit surgery     for melanoma    There were no vitals filed for this visit.      Subjective Assessment - 05/17/16 0814    Subjective No pain really today.  May have worked it too much Wednesday, had pain increase.  He continues to work on [employer] as he can.  Wears Knee immobilizer when standing.  Would like a brace that would control motion and not inhibit it so much.    Currently in Pain? No/denies            [facility] PT Assessment - 05/17/16 0854      AROM   Right Knee Flexion 80  83 deg AAROM             [facility] Adult PT Treatment/Exercise - 05/17/16 0818      Knee/Hip Exercises: Standing   Heel Raises Both;1 set;15 reps   Wall Squat 10 reps   Wall Squat Limitations 5 sec hold 1/4 squat    Other Standing Knee Exercises standing weight shifting using wall      Knee/Hip Exercises: Seated   Long Arc Quad Strengthening;Right;1 set;20 reps   Hamstring Curl Strengthening;Right;1 set;20 reps   Hamstring Limitations yellow T band      Knee/Hip Exercises: Supine   Quad Sets AROM;Strengthening;Right;1 set   Short Arc [employer]  Strengthening;Right;1 set;20 reps   Heel Slides AAROM;Right;1 set;10 reps   Hip Adduction Isometric --   Straight Leg Raises Strengthening;Right;2 sets;10 reps   Straight Leg Raise with External Rotation Strengthening;Right;1 set;10 reps     Knee/Hip Exercises: Sidelying   Hip ABduction Strengthening;Both;20 reps   Hip ADduction Strengthening;Right;1 set;20 reps     Cryotherapy   Number Minutes Cryotherapy 8 Minutes   Cryotherapy Location Knee   Type of Cryotherapy Ice pack     Manual Therapy   Manual therapy comments sore mid shin with med sized knot, sore just distal to patella    Joint Mobilization gentle Gr I-II for flexion, sore    Passive ROM flex, ext to tolerate                   PT Short Term Goals - 05/17/16 0847      PT SHORT TERM GOAL #1   Title Pt will be I with initial HEP for knee AROM    Status Achieved      PT SHORT TERM GOAL #2   Title Pt will achieve 90 deg knee flexion for proper sitting and transfers.    Status On-going     PT SHORT TERM GOAL #3   Title Pt will be able to walk without Knee immobilizer brace as MD allows , LRAD    Status On-going           PT Long Term Goals - 05/17/16 0853      PT LONG TERM GOAL #1   Title Pt will be able to walk as needed for work without AD and pain <3/10 most of the time.    Status On-going     PT LONG TERM GOAL #2   Title Pt will demo 5/5 knee strength for maximal support with ambulation, stairs.     Status On-going     PT LONG TERM GOAL #3   Title Pt will negotiate 12+ stairs safely with 1 rail and min pain increase.    Status On-going     PT LONG TERM GOAL #4   Title Pt will be able to get in and out of the car without difficulty    Status On-going     PT LONG TERM GOAL #5   Title Pt will be I with more advanced HEP    Status On-going               Plan - 05/17/16 6269    Clinical Impression Statement Patient doing well, is I with HEP for initial knee AROM and strength.  Able to advance to easy gentle closed chain exercises without increaed pain.  He was reluctant to have me do PROM.  AROM to 80 deg.     PT Next Visit Plan ROM, strength .  Sees IF MD sent protocol.  Open chain.  Check HEP, try NUstep as tol.    PT Home Exercise Plan level 1 knee , gentle 1/4 squat wall, hamstring curl yellow band, weight shift       Patient will benefit from skilled therapeutic intervention in order to improve the following deficits and impairments:  Decreased range of motion, Difficulty walking, Pain, Hypomobility, Decreased scar mobility, Decreased mobility, Decreased strength, Increased edema, Impaired flexibility  Visit Diagnosis: Stiffness of right knee, not elsewhere classified  Muscle weakness (generalized)  Difficulty in walking, not elsewhere classified     Problem List Patient Active Problem List   Diagnosis Date  Noted  .  Patellar tendon rupture 03/27/2016  . Thrombosis of saphenous vein, left 11/28/2015  . Alcohol induced acute pancreatitis 11/23/2015  . Elevated lipase 11/23/2015  . Pancreatitis 11/23/2015  . Acute pancreatitis 11/23/2015  . Renal mass 02/15/2015  . Renal cell carcinoma ([place]) 02/15/2015  . Cellulitis and abscess of leg, left 07/14/2012  . GERD (gastroesophageal reflux disease) 07/14/2012  . Alcohol dependence ([place]) 05/25/2012    Semaja Lymon 05/17/2016, 9:07 AM  [facility] [address] [place], [place], [address] Phone: [phone]   Fax:  [phone]  Name: HENRI BAUMLER MRN: [id number] Date of Birth: 06-02-1960  Raeford Razor, PT 05/17/16 9:07 AM Phone: [phone] Fax: [phone]

## 2016-05-17 NOTE — Telephone Encounter (Signed)
Please advise on medication. James [name] saw patient on 3/7 and prescribed Hydrocodone 5/325 1 po q 6 #60. He was supposed to make follow up appt for 3/28.  I will make his appt. Thanks.

## 2016-05-17 NOTE — Telephone Encounter (Signed)
Pt came in stating his therapist jennifer told him he needed to be seen around 3/28. No appts open until April. Pt stated he could not wait until April. Pt also requesting hydrocodone 3-32-5. He would prefer the 10-32-5 CB: [phone]

## 2016-05-17 NOTE — Telephone Encounter (Signed)
I called discussed. Make him appt in a couple weeks.  I called him , time to stop narcotics.

## 2016-05-20 NOTE — Telephone Encounter (Signed)
done

## 2016-05-20 NOTE — Telephone Encounter (Signed)
Can you please put patient on Dr. Lorin Mercy schedule for Friday, April 6 at 1:00pm? He is follow up knee-he has had surgery. Thanks.

## 2016-05-21 ENCOUNTER — Ambulatory Visit: Payer: Self-pay | Admitting: Physical Therapy

## 2016-05-21 ENCOUNTER — Encounter: Payer: Self-pay | Admitting: Physical Therapy

## 2016-05-21 DIAGNOSIS — R262 Difficulty in walking, not elsewhere classified: Secondary | ICD-10-CM

## 2016-05-21 DIAGNOSIS — M6281 Muscle weakness (generalized): Secondary | ICD-10-CM

## 2016-05-21 DIAGNOSIS — M25661 Stiffness of right knee, not elsewhere classified: Secondary | ICD-10-CM

## 2016-05-21 NOTE — Therapy (Signed)
[place], [place], [address] Phone: [phone]   Fax:  [phone]  Physical Therapy Treatment  Patient Details  Name: James [name] MRN: [id number] Date of Birth: [birth year] Referring Provider: Dr. Rodell Perna  Encounter Date: 05/21/2016      PT End of Session - 05/21/16 1725    Visit Number 3   Number of Visits 16   Date for PT Re-Evaluation 07/05/16   PT Start Time 9211   PT Stop Time 1715   PT Time Calculation (min) 38 min   Activity Tolerance Patient tolerated treatment well   Behavior During Therapy [facility] for tasks assessed/performed      Past Medical History:  Diagnosis Date  . Arthritis    right knee right wrist   . Chronic kidney disease   . GERD (gastroesophageal reflux disease)   . Hypertension    pt states was treated with medication back in 2000; currently on no medication   . Left renal mass   . Melanoma ([place]) dx'd 73yrs ago age 13   rt lat chest wall--surg only  . Renal cancer ([place]) dx'd 01/2015   left nephrectomy  . Wears glasses     Past Surgical History:  Procedure Laterality Date  . ABDOMINAL SURGERY  2017   remove tumor cyst kidney  . COLONOSCOPY    . CYSTOSCOPY WITH RETROGRADE PYELOGRAM, URETEROSCOPY AND STENT PLACEMENT Left 07/19/2015   Procedure: LEFT RETROGRADE PYELOGRAM, LEFT URETEROSCOPY AND LEFT URETERAL STENT PLACEMENT;  Surgeon: Ardis Hughs, MD;  [facility];  Service: Urology;  Laterality: Left;  . drained cyst      left kidney 10 months ago   . lymph nodes removed      right armpit secondary to melanoma   . NEPHRECTOMY Left 02/15/2015   Procedure: LEFT OPEN PARTIAL NEPHRECTOMY;  Surgeon: Ardis Hughs, MD;  [facility];  Service: Urology;  Laterality: Left;  . PATELLAR TENDON REPAIR Right 03/27/2016   Procedure: RIGHT PATELLA TENDON REPAIR;  Surgeon: Marybelle Killings, MD;  Location: [place];  Service: Orthopedics;  Laterality: Right;  .  right armpit surgery     for melanoma    There were no vitals filed for this visit.      Subjective Assessment - 05/21/16 1655    Subjective Fell in tub Sunday stepping out of shower. body was on the floor leg still in the shower.  Pain is constant. He does not want to exercises ,  X ray scheduled for next week.   Currently in Pain? Yes   Pain Score --  he could not give a number   Pain Location Knee   Pain Orientation Right;Anterior   Pain Descriptors / Indicators Constant   Pain Type Acute pain   Aggravating Factors  falling   Pain Relieving Factors has tried ice and elevation            [facility] PT Assessment - 05/21/16 0001      Circumferential Edema   Circumferential - Right 15 3/4  inch mid patella                     [facility] Adult PT Treatment/Exercise - 05/21/16 0001      Cryotherapy   Number Minutes Cryotherapy 15 Minutes   Cryotherapy Location Knee   Type of Cryotherapy --  cold pack,  leg elevated     Electrical Stimulation   Electrical Stimulation Location knee  Electrical Stimulation Action IFC   Electrical Stimulation Parameters 25   Electrical Stimulation Goals Pain     Manual Therapy   Manual therapy comments X Kinesiotex 50% mid 1/3 stretch                  PT Short Term Goals - 05/17/16 0847      PT SHORT TERM GOAL #1   Title Pt will be I with initial HEP for knee AROM    Status Achieved     PT SHORT TERM GOAL #2   Title Pt will achieve 90 deg knee flexion for proper sitting and transfers.    Status On-going     PT SHORT TERM GOAL #3   Title Pt will be able to walk without Knee immobilizer brace as MD allows , LRAD    Status On-going           PT Long Term Goals - 05/17/16 0853      PT LONG TERM GOAL #1   Title Pt will be able to walk as needed for work without AD and pain <3/10 most of the time.    Status On-going     PT LONG TERM GOAL #2   Title Pt will demo 5/5 knee strength for maximal support with  ambulation, stairs.     Status On-going     PT LONG TERM GOAL #3   Title Pt will negotiate 12+ stairs safely with 1 rail and min pain increase.    Status On-going     PT LONG TERM GOAL #4   Title Pt will be able to get in and out of the car without difficulty    Status On-going     PT LONG TERM GOAL #5   Title Pt will be I with more advanced HEP    Status On-going               Plan - 05/21/16 1726    Clinical Impression Statement Recent fall pain increased.  X ray next week.  Patient declined exercises.  Modalities and tape only.  Less pain post session.  Patient noted he was able to do some exercises at home. after the fall.   PT Next Visit Plan See if X- Ray done No exercises.   PT Home Exercise Plan level 1 knee , gentle 1/4 squat wall, hamstring curl yellow band, weight shift    Consulted and Agree with Plan of Care Patient      Patient will benefit from skilled therapeutic intervention in order to improve the following deficits and impairments:  Decreased range of motion, Difficulty walking, Pain, Hypomobility, Decreased scar mobility, Decreased mobility, Decreased strength, Increased edema, Impaired flexibility  Visit Diagnosis: Stiffness of right knee, not elsewhere classified  Muscle weakness (generalized)  Difficulty in walking, not elsewhere classified     Problem List Patient Active Problem List   Diagnosis Date Noted  . Patellar tendon rupture 03/27/2016  . Thrombosis of saphenous vein, left 11/28/2015  . Alcohol induced acute pancreatitis 11/23/2015  . Elevated lipase 11/23/2015  . Pancreatitis 11/23/2015  . Acute pancreatitis 11/23/2015  . Renal mass 02/15/2015  . Renal cell carcinoma ([place]) 02/15/2015  . Cellulitis and abscess of leg, left 07/14/2012  . GERD (gastroesophageal reflux disease) 07/14/2012  . Alcohol dependence ([place]) 05/25/2012    HARRIS,KAREN PTA 05/21/2016, 5:31 PM  [facility] [address] [place], [place], [address] Phone: [phone]   Fax:  [phone]  Name: James [name] MRN: [id number] Date of Birth: 1960/03/28

## 2016-05-28 ENCOUNTER — Ambulatory Visit: Payer: Self-pay | Attending: Orthopaedic Surgery | Admitting: Physical Therapy

## 2016-05-28 DIAGNOSIS — M25661 Stiffness of right knee, not elsewhere classified: Secondary | ICD-10-CM | POA: Insufficient documentation

## 2016-05-28 DIAGNOSIS — R262 Difficulty in walking, not elsewhere classified: Secondary | ICD-10-CM | POA: Insufficient documentation

## 2016-05-28 DIAGNOSIS — M6281 Muscle weakness (generalized): Secondary | ICD-10-CM | POA: Insufficient documentation

## 2016-05-28 NOTE — Therapy (Signed)
[place], [place], [address] Phone: [phone]   Fax:  [phone]  Physical Therapy Treatment  Patient Details  Name: James [name] MRN: [id number] Date of Birth: 1961/02/03 Referring Provider: Dr. Rodell Perna  Encounter Date: 05/28/2016      PT End of Session - 05/28/16 1633    Visit Number 4   Number of Visits 16   Date for PT Re-Evaluation 07/05/16   PT Start Time 0432   PT Stop Time 0515   PT Time Calculation (min) 43 min      Past Medical History:  Diagnosis Date  . Arthritis    right knee right wrist   . Chronic kidney disease   . GERD (gastroesophageal reflux disease)   . Hypertension    pt states was treated with medication back in 2000; currently on no medication   . Left renal mass   . Melanoma ([place]) dx'd 13yrs ago age 67   rt lat chest wall--surg only  . Renal cancer ([place]) dx'd 01/2015   left nephrectomy  . Wears glasses     Past Surgical History:  Procedure Laterality Date  . ABDOMINAL SURGERY  2017   remove tumor cyst kidney  . COLONOSCOPY    . CYSTOSCOPY WITH RETROGRADE PYELOGRAM, URETEROSCOPY AND STENT PLACEMENT Left 07/19/2015   Procedure: LEFT RETROGRADE PYELOGRAM, LEFT URETEROSCOPY AND LEFT URETERAL STENT PLACEMENT;  Surgeon: Ardis Hughs, MD;  Location: [facility];  Service: Urology;  Laterality: Left;  . drained cyst      left kidney 10 months ago   . lymph nodes removed      right armpit secondary to melanoma   . NEPHRECTOMY Left 02/15/2015   Procedure: LEFT OPEN PARTIAL NEPHRECTOMY;  Surgeon: Ardis Hughs, MD;  Location: [facility];  Service: Urology;  Laterality: Left;  . PATELLAR TENDON REPAIR Right 03/27/2016   Procedure: RIGHT PATELLA TENDON REPAIR;  Surgeon: Marybelle Killings, MD;  Location: [facility];  Service: Orthopedics;  Laterality: Right;  . right armpit surgery     for melanoma    There were no vitals filed for this visit.      Subjective Assessment  - 05/28/16 1635    Subjective It's been popping and cracking ever since I felt.    Currently in Pain? Yes   Pain Score 6    Pain Location Knee   Pain Descriptors / Indicators Constant   Aggravating Factors  pain is just constant    Pain Relieving Factors estim            [facility] PT Assessment - 05/28/16 0001      AROM   Right Knee Flexion 93                     [facility] Adult PT Treatment/Exercise - 05/28/16 0001      Ambulation/Gait   Ambulation/Gait Yes   Ambulation/Gait Assistance 6: Modified independent (Device/Increase time)   Ambulation Distance (Feet) 100 Feet   Assistive device L Axillary Crutch   Gait Pattern Step-through pattern   Ambulation Surface Level   Gait Comments gait training with unilateral crutch  focusing on gait mechanics, encouraged pt to use AD when not wearing immobilizer outside of the home.      Knee/Hip Exercises: Seated   Long Arc Quad Strengthening;Right;1 set;20 reps   Hamstring Curl Strengthening;Right;1 set;20 reps   Hamstring Limitations yellow T band      Knee/Hip Exercises: Supine  Quad Sets AROM;Strengthening;Right;1 set   [employer] Sets Strengthening;Right;1 set;20 reps   Heel Slides AROM;Right;1 set;10 reps   Straight Leg Raises Strengthening;Right;2 sets;10 reps     Knee/Hip Exercises: Sidelying   Hip ABduction Strengthening;Both;20 reps   Hip ADduction Strengthening;Right;1 set;20 reps     Cryotherapy   Number Minutes Cryotherapy 15 Minutes   Cryotherapy Location Knee   Type of Cryotherapy Ice pack     Electrical Stimulation   Electrical Stimulation Location knee   Electrical Stimulation Action IFC    Electrical Stimulation Parameters 27   Electrical Stimulation Goals Pain                  PT Short Term Goals - 05/17/16 0847      PT SHORT TERM GOAL #1   Title Pt will be I with initial HEP for knee AROM    Status Achieved     PT SHORT TERM GOAL #2   Title Pt will achieve 90 deg knee  flexion for proper sitting and transfers.    Status On-going     PT SHORT TERM GOAL #3   Title Pt will be able to walk without Knee immobilizer brace as MD allows , LRAD    Status On-going           PT Long Term Goals - 05/17/16 0853      PT LONG TERM GOAL #1   Title Pt will be able to walk as needed for work without AD and pain <3/10 most of the time.    Status On-going     PT LONG TERM GOAL #2   Title Pt will demo 5/5 knee strength for maximal support with ambulation, stairs.     Status On-going     PT LONG TERM GOAL #3   Title Pt will negotiate 12+ stairs safely with 1 rail and min pain increase.    Status On-going     PT LONG TERM GOAL #4   Title Pt will be able to get in and out of the car without difficulty    Status On-going     PT LONG TERM GOAL #5   Title Pt will be I with more advanced HEP    Status On-going               Plan - 05/28/16 1718    Clinical Impression Statement Pt has not seen MD since fall. Scheduled for Friday. he will come one more time this week prior to seeing MD. He reports popping and cracking since fall with constant pain at 6/10 anterior knee. He was able to perform gentle therex without c/o increased pain. His AROM has improved to 93 degrees. Repeated IFC as pt reported this decreased his pain last visit. Instructed him in gait with unilateral crutch, he demonstrates good gait mechanics. Encouraged him to use crutch instead of immobilizer when out of house. MD has asked im to wear immobilizer at work full time. Pain reduced to 2/10 after estim.    PT Next Visit Plan gentle therex, gait with crutch, estim, massage?   PT Home Exercise Plan level 1 knee , gentle 1/4 squat wall, hamstring curl yellow band, weight shift    Consulted and Agree with Plan of Care Patient      Patient will benefit from skilled therapeutic intervention in order to improve the following deficits and impairments:  Decreased range of motion, Difficulty walking,  Pain, Hypomobility, Decreased scar mobility, Decreased mobility, Decreased strength,  Increased edema, Impaired flexibility  Visit Diagnosis: Stiffness of right knee, not elsewhere classified  Muscle weakness (generalized)  Difficulty in walking, not elsewhere classified     Problem List Patient Active Problem List   Diagnosis Date Noted  . Patellar tendon rupture 03/27/2016  . Thrombosis of saphenous vein, left 11/28/2015  . Alcohol induced acute pancreatitis 11/23/2015  . Elevated lipase 11/23/2015  . Pancreatitis 11/23/2015  . Acute pancreatitis 11/23/2015  . Renal mass 02/15/2015  . Renal cell carcinoma ([place]) 02/15/2015  . Cellulitis and abscess of leg, left 07/14/2012  . GERD (gastroesophageal reflux disease) 07/14/2012  . Alcohol dependence ([place]) 05/25/2012    Dorene Ar, PTA 05/28/2016, 5:32 PM  [facility] [address] [place], [place], [address] Phone: [phone]   Fax:  [phone]  Name: James [name] MRN: [id number] Date of Birth: 1960-05-13

## 2016-05-30 ENCOUNTER — Encounter: Payer: Self-pay | Admitting: Physical Therapy

## 2016-05-30 ENCOUNTER — Ambulatory Visit: Payer: Self-pay | Admitting: Physical Therapy

## 2016-05-30 DIAGNOSIS — M6281 Muscle weakness (generalized): Secondary | ICD-10-CM

## 2016-05-30 DIAGNOSIS — R262 Difficulty in walking, not elsewhere classified: Secondary | ICD-10-CM

## 2016-05-30 DIAGNOSIS — M25661 Stiffness of right knee, not elsewhere classified: Secondary | ICD-10-CM

## 2016-05-30 NOTE — Therapy (Addendum)
[place], [place], [address] Phone: [phone]   Fax:  [phone]  Physical Therapy Treatment and Discharge  Patient Details  Name: James [name] MRN: [id number] Date of Birth: Aug 05, 1960 Referring Provider: Dr. Rodell Perna  Encounter Date: 05/30/2016      PT End of Session - 05/30/16 1706    Visit Number 5   Number of Visits 16   Date for PT Re-Evaluation 07/05/16   PT Start Time 1630   PT Stop Time 1716   PT Time Calculation (min) 46 min   Activity Tolerance Patient tolerated treatment well   Behavior During Therapy [facility] for tasks assessed/performed      Past Medical History:  Diagnosis Date  . Arthritis    right knee right wrist   . Chronic kidney disease   . GERD (gastroesophageal reflux disease)   . Hypertension    pt states was treated with medication back in 2000; currently on no medication   . Left renal mass   . Melanoma ([place]) dx'd 35yr ago age 56  rt lat chest wall--surg only  . [place]   left nephrectomy  . Wears glasses     Past Surgical History:  Procedure Laterality Date  . ABDOMINAL SURGERY  2017   remove tumor cyst kidney  . COLONOSCOPY    . CYSTOSCOPY WITH RETROGRADE PYELOGRAM, URETEROSCOPY AND STENT PLACEMENT Left 07/19/2015   Procedure: LEFT RETROGRADE PYELOGRAM, LEFT URETEROSCOPY AND LEFT URETERAL STENT PLACEMENT;  Surgeon: BArdis Hughs MD;  [facility];  Service: Urology;  Laterality: Left;  . drained cyst      left kidney 10 months ago   . lymph nodes removed      right armpit secondary to melanoma   . NEPHRECTOMY Left 02/15/2015   Procedure: LEFT OPEN PARTIAL NEPHRECTOMY;  Surgeon: BArdis Hughs MD;  [facility];  Service: Urology;  Laterality: Left;  . PATELLAR TENDON REPAIR Right 03/27/2016   Procedure: RIGHT PATELLA TENDON REPAIR;  Surgeon: MMarybelle Killings MD;  [place] [facility]  Service: Orthopedics;  Laterality:  Right;  . right armpit surgery     for melanoma    There were no vitals filed for this visit.      Subjective Assessment - 05/30/16 1635    Subjective Knee is popping and cracking.   pain 5-6/10. Walks with 1 crutch.  He was on his feet a lot at work today and it is sore   Currently in Pain? Yes   Pain Score 6    Pain Location Knee   Pain Descriptors / Indicators Discomfort;Sore   Pain Type Acute pain   Pain Frequency Constant   Aggravating Factors  pain is constant   Pain Relieving Factors e stim            [facility] PT Assessment - 05/30/16 0001      AROM   Right Knee Flexion 95                     [facility] Adult PT Treatment/Exercise - 05/30/16 0001      Knee/Hip Exercises: Standing   Heel Raises 10 reps  toe raises   Wall Squat 1 set;10 reps   Wall Squat Limitations moves 3 inches     Knee/Hip Exercises: Seated   Long AProbation officer  Long Arc Quad Limitations 10 X shakey     Knee/Hip Exercises: Supine  Quad Sets AROM;Strengthening;Right;1 set   [employer] Sets Strengthening;Right;1 set;20 reps   Heel Slides AROM;Right;1 set;10 reps   Straight Leg Raises Strengthening;Right;2 sets;10 reps     Cryotherapy   Number Minutes Cryotherapy 15 Minutes   Cryotherapy Location Knee   Type of Cryotherapy --  cold pack concurrent with IFC     Electrical Stimulation   Electrical Stimulation Location knee   Electrical Stimulation Action IFC   Electrical Stimulation Parameters 23   Electrical Stimulation Goals Pain     Manual Therapy   Manual therapy comments soft tissue work distal thigh , gentle                  PT Short Term Goals - 05/30/16 1724      PT SHORT TERM GOAL #1   Title Pt will be I with initial HEP for knee AROM    Time 4   Period Weeks   Status Achieved     PT SHORT TERM GOAL #2   Title Pt will achieve 90 deg knee flexion for proper sitting and transfers.    Baseline 95 AROM   Time 4   Period Weeks    Status Achieved     PT SHORT TERM GOAL #3   Title Pt will be able to walk without Knee immobilizer brace as MD allows , LRAD    Baseline Uses immobilizer   Time 8   Period Weeks           PT Long Term Goals - 05/17/16 0109      PT LONG TERM GOAL #1   Title Pt will be able to walk as needed for work without AD and pain <3/10 most of the time.    Status On-going     PT LONG TERM GOAL #2   Title Pt will demo 5/5 knee strength for maximal support with ambulation, stairs.     Status On-going     PT LONG TERM GOAL #3   Title Pt will negotiate 12+ stairs safely with 1 rail and min pain increase.    Status On-going     PT LONG TERM GOAL #4   Title Pt will be able to get in and out of the car without difficulty    Status On-going     PT LONG TERM GOAL #5   Title Pt will be I with more advanced HEP    Status On-going               Plan - 05/30/16 1712    Clinical Impression Statement 95 AROM knee flexion.  6/10 pain after over 11 hours of   work today.  Gentle exercises continues.  STG#1 and #2 met.  Patient uses immobilizer at work. Patient is post op 9 weeks.   PT Next Visit Plan gentle therex, gait with crutch, estim, massage see what MD said.   PT Home Exercise Plan level 1 knee , gentle 1/4 squat wall, hamstring curl yellow band, weight shift    Consulted and Agree with Plan of Care Patient      Patient will benefit from skilled therapeutic intervention in order to improve the following deficits and impairments:  Decreased range of motion, Difficulty walking, Pain, Hypomobility, Decreased scar mobility, Decreased mobility, Decreased strength, Increased edema, Impaired flexibility  Visit Diagnosis: Stiffness of right knee, not elsewhere classified  Muscle weakness (generalized)  Difficulty in walking, not elsewhere classified     Problem List Patient Active Problem List  Diagnosis Date Noted  . Patellar tendon rupture 03/27/2016  . Thrombosis of  saphenous vein, left 11/28/2015  . Alcohol induced acute pancreatitis 11/23/2015  . Elevated lipase 11/23/2015  . Pancreatitis 11/23/2015  . Acute pancreatitis 11/23/2015  . Renal mass 02/15/2015  . Renal cell carcinoma ([place]) 02/15/2015  . Cellulitis and abscess of leg, left 07/14/2012  . GERD (gastroesophageal reflux disease) 07/14/2012  . Alcohol dependence ([place]) 05/25/2012    HARRIS,KAREN PTA 05/30/2016, 5:25 PM  [facility] [address] [place], [place], [address] Phone: [phone]   Fax:  [phone]  Name: James [name] MRN: [id number] Date of Birth: 09-22-1960  PHYSICAL THERAPY DISCHARGE SUMMARY  Visits from Start of Care: 5  Current functional level related to goals / functional outcomes: See above for most recent info    Remaining deficits: Knee AROM and strength, gait , pain    Education / Equipment: HEP  Plan: Patient agrees to discharge.  Patient goals were not met. Patient is being discharged due to the physician's request.  ?????    MD told patient he could continue to work on his AROM at home.   Raeford Razor, PT 06/04/16 2:20 PM Phone: [phone] Fax: [phone]

## 2016-05-31 ENCOUNTER — Ambulatory Visit (INDEPENDENT_AMBULATORY_CARE_PROVIDER_SITE_OTHER): Payer: Self-pay | Admitting: Orthopaedic Surgery

## 2016-05-31 ENCOUNTER — Encounter (INDEPENDENT_AMBULATORY_CARE_PROVIDER_SITE_OTHER): Payer: Self-pay | Admitting: Orthopaedic Surgery

## 2016-05-31 VITALS — BP 140/92 | HR 88 | Ht 73.0 in | Wt 200.0 lb

## 2016-05-31 DIAGNOSIS — S86811D Strain of other muscle(s) and tendon(s) at lower leg level, right leg, subsequent encounter: Secondary | ICD-10-CM

## 2016-05-31 NOTE — Progress Notes (Signed)
Post-Op Visit Note   Patient: James [name]           Date of Birth: Jan 12, 1961           MRN: [id number] Visit Date: 05/31/2016 PCP: No PCP Per Patient   Assessment & Plan: Follow-up right patellar tendon repair. He can flex just past 90 he's walking in his house without anything and uses a knee immobilizer when he goes to work. Swelling is down he's made excellent progress he can do a straight leg raise.  Chief Complaint:  Chief Complaint  Patient presents with  . Right Knee - Routine Post Op   Visit Diagnoses:  1. Rupture of right patellar tendon, subsequent encounter     Plan: He'll continue to work on strengthening continues his knee immobilizer were can gradually work his way out of it over the next month.  Follow-Up Instructions: No Follow-up on file.   Orders:  No orders of the defined types were placed in this encounter.  No orders of the defined types were placed in this encounter.   Imaging: No results found.  PMFS History: Patient Active Problem List   Diagnosis Date Noted  . Patellar tendon rupture 03/27/2016  . Thrombosis of saphenous vein, left 11/28/2015  . Alcohol induced acute pancreatitis 11/23/2015  . Elevated lipase 11/23/2015  . Pancreatitis 11/23/2015  . Acute pancreatitis 11/23/2015  . Renal mass 02/15/2015  . Renal cell carcinoma ([place]) 02/15/2015  . Cellulitis and abscess of leg, left 07/14/2012  . GERD (gastroesophageal reflux disease) 07/14/2012  . Alcohol dependence ([place]) 05/25/2012   Past Medical History:  Diagnosis Date  . Arthritis    right knee right wrist   . Chronic kidney disease   . GERD (gastroesophageal reflux disease)   . Hypertension    pt states was treated with medication back in 2000; currently on no medication   . Left renal mass   . Melanoma ([place]) dx'd 73yrs ago age 86   rt lat chest wall--surg only  . Renal cancer ([place]) dx'd 01/2015   left nephrectomy  . Wears glasses     Family History  Problem  Relation Age of Onset  . Lung cancer Mother     Past Surgical History:  Procedure Laterality Date  . ABDOMINAL SURGERY  2017   remove tumor cyst kidney  . COLONOSCOPY    . CYSTOSCOPY WITH RETROGRADE PYELOGRAM, URETEROSCOPY AND STENT PLACEMENT Left 07/19/2015   Procedure: LEFT RETROGRADE PYELOGRAM, LEFT URETEROSCOPY AND LEFT URETERAL STENT PLACEMENT;  Surgeon: Ardis Hughs, MD;  [facility];  Service: Urology;  Laterality: Left;  . drained cyst      left kidney 10 months ago   . lymph nodes removed      right armpit secondary to melanoma   . NEPHRECTOMY Left 02/15/2015   Procedure: LEFT OPEN PARTIAL NEPHRECTOMY;  Surgeon: Ardis Hughs, MD;  [facility];  Service: Urology;  Laterality: Left;  . PATELLAR TENDON REPAIR Right 03/27/2016   Procedure: RIGHT PATELLA TENDON REPAIR;  Surgeon: Marybelle Killings, MD;  Location: [place];  Service: Orthopedics;  Laterality: Right;  . right armpit surgery     for melanoma   Social History   Occupational History  . Not on file.   Social History Main Topics  . Smoking status: Current Every Day Smoker    Packs/day: 0.33    Years: 20.00    Types: Cigarettes  . Smokeless tobacco: Never Used  . Alcohol use No  Comment: no alcohol since 11/22/2015  . Drug use: No  . Sexual activity: No

## 2016-06-03 ENCOUNTER — Ambulatory Visit: Payer: Self-pay | Admitting: Physical Therapy

## 2016-06-07 ENCOUNTER — Encounter: Payer: Self-pay | Admitting: Physical Therapy

## 2016-06-11 ENCOUNTER — Encounter: Payer: Self-pay | Admitting: Physical Therapy

## 2016-06-14 ENCOUNTER — Encounter: Payer: Self-pay | Admitting: Physical Therapy

## 2016-07-26 NOTE — Addendum Note (Signed)
Addendum  created 07/26/16 1018 by Rica Koyanagi, MD   Sign clinical note

## 2016-07-29 ENCOUNTER — Encounter: Payer: Self-pay | Admitting: Gastroenterology

## 2016-08-06 ENCOUNTER — Other Ambulatory Visit (INDEPENDENT_AMBULATORY_CARE_PROVIDER_SITE_OTHER): Payer: BLUE CROSS/BLUE SHIELD

## 2016-08-06 ENCOUNTER — Encounter: Payer: Self-pay | Admitting: Physician Assistant

## 2016-08-06 ENCOUNTER — Ambulatory Visit (INDEPENDENT_AMBULATORY_CARE_PROVIDER_SITE_OTHER): Payer: BLUE CROSS/BLUE SHIELD | Admitting: Physician Assistant

## 2016-08-06 VITALS — BP 140/82 | HR 84 | Ht 73.0 in | Wt 185.0 lb

## 2016-08-06 DIAGNOSIS — Z8719 Personal history of other diseases of the digestive system: Secondary | ICD-10-CM

## 2016-08-06 DIAGNOSIS — Z85528 Personal history of other malignant neoplasm of kidney: Secondary | ICD-10-CM

## 2016-08-06 DIAGNOSIS — R1012 Left upper quadrant pain: Secondary | ICD-10-CM

## 2016-08-06 DIAGNOSIS — K219 Gastro-esophageal reflux disease without esophagitis: Secondary | ICD-10-CM | POA: Diagnosis not present

## 2016-08-06 LAB — COMPREHENSIVE METABOLIC PANEL
ALT: 16 U/L (ref 0–53)
AST: 19 U/L (ref 0–37)
Albumin: 4.8 g/dL (ref 3.5–5.2)
Alkaline Phosphatase: 67 U/L (ref 39–117)
BUN: 12 mg/dL (ref 6–23)
CO2: 27 mEq/L (ref 19–32)
Calcium: 9.9 mg/dL (ref 8.4–10.5)
Chloride: 105 mEq/L (ref 96–112)
Creatinine, Ser: 1.01 mg/dL (ref 0.40–1.50)
GFR: 81.2 mL/min (ref >60.00)
Glucose, Bld: 99 mg/dL (ref 70–99)
Potassium: 3.7 mEq/L (ref 3.5–5.1)
Sodium: 138 mEq/L (ref 135–145)
Total Bilirubin: 0.8 mg/dL (ref 0.2–1.2)
Total Protein: 8 g/dL (ref 6.0–8.3)

## 2016-08-06 LAB — CBC WITH DIFFERENTIAL/PLATELET
Basophils Absolute: 0 10*3/uL (ref 0.0–0.1)
Basophils Relative: 0.5 % (ref 0.0–3.0)
Eosinophils Absolute: 0 10*3/uL (ref 0.0–0.7)
Eosinophils Relative: 0.3 % (ref 0.0–5.0)
HCT: 42 % (ref 39.0–52.0)
Hemoglobin: 14.1 g/dL (ref 13.0–17.0)
Lymphocytes Relative: 24.9 % (ref 12.0–46.0)
Lymphs Abs: 1.8 10*3/uL (ref 0.7–4.0)
MCHC: 33.5 g/dL (ref 30.0–36.0)
MCV: 81.2 fl (ref 78.0–100.0)
Monocytes Absolute: 0.4 10*3/uL (ref 0.1–1.0)
Monocytes Relative: 6 % (ref 3.0–12.0)
Neutro Abs: 4.9 10*3/uL (ref 1.4–7.7)
Neutrophils Relative %: 68.3 % (ref 43.0–77.0)
Platelets: 233 10*3/uL (ref 150.0–400.0)
RBC: 5.17 Mil/uL (ref 4.22–5.81)
RDW: 15.4 % (ref 11.5–15.5)
WBC: 7.2 10*3/uL (ref 4.0–10.5)

## 2016-08-06 LAB — LIPASE: Lipase: 9 U/L — ABNORMAL LOW (ref 11.0–59.0)

## 2016-08-06 LAB — AMYLASE: Amylase: 33 U/L (ref 27–131)

## 2016-08-06 MED ORDER — OMEPRAZOLE 20 MG PO CPDR: 20.0000 mg | DELAYED_RELEASE_CAPSULE | Freq: Two times a day (BID) | ORAL | 2 refills | Status: DC

## 2016-08-06 MED ORDER — TRAMADOL HCL 50 MG PO TABS: 50.0000 mg | ORAL_TABLET | Freq: Four times a day (QID) | ORAL | 0 refills | Status: DC | PRN

## 2016-08-06 MED FILL — traMADol HCL 50 MG TABS: 50 | 7 days supply | Qty: 30 | Fill #0

## 2016-08-06 MED FILL — OMEPRAZOLE 20 MG CAPSULE DR: 20 | 30 days supply | Qty: 60 | Fill #0

## 2016-08-06 NOTE — Patient Instructions (Addendum)
Please go to the basement level to have your labs drawn.  Increase the Omeprazole to 20 mg , take 1 tab twice daily.  We have provided you with a prescription for Ultram ( Tramadol) 50 mg for pain.    You have been scheduled for a CT scan of the abdomen and pelvis at  [facility] are scheduled on Thursday 08-08-2016 at 7:00 am. You should arrive at 6:45 am  to your appointment time for registration. Please follow the written instructions below on the day of your exam:  WARNING: IF YOU ARE ALLERGIC TO IODINE/X-RAY DYE, PLEASE NOTIFY RADIOLOGY IMMEDIATELY AT [phone]! YOU WILL BE GIVEN A 13 HOUR PREMEDICATION PREP.  1) Do not eat or drink anything after 3:00 am (4 hours prior to your test) 2) You have been given 2 bottles of oral contrast to drink. The solution may taste               better if refrigerated, but do NOT add ice or any other liquid to this solution. Shake             well before drinking.    Drink 1 bottle of contrast @ 5:00  am (2 hours prior to your exam)  Drink 1 bottle of contrast @ 6:00 am  (1 hour prior to your exam)  You may take any medications as prescribed with a small amount of water except for the following: Metformin, Glucophage, Glucovance, Avandamet, Riomet, Fortamet, Actoplus Met, Janumet, Glumetza or Metaglip. The above medications must be held the day of the exam AND 48 hours after the exam.  The purpose of you drinking the oral contrast is to aid in the visualization of your intestinal tract. The contrast solution may cause some diarrhea. Before your exam is started, you will be given a small amount of fluid to drink. Depending on your individual set of symptoms, you may also receive an intravenous injection of x-ray contrast/dye. Plan on being at [facility] for 30 minutes or long, depending on the type of exam you are having performed.  If you have any questions regarding your exam or if you need to reschedule, you may call the CT  department at [phone] between the hours of 8:00 am and 5:00 pm, Monday-Friday.  ________________________________________________________________________

## 2016-08-06 NOTE — Progress Notes (Signed)
Subjective:    Patient ID: James [name], male    DOB: 1960-03-26, 56 y.o.   MRN: [id number]  HPI James [name] is a 56 year old white male, new to GI today but known remotely to Dr. Deatra Ina, self-referred for evaluation of left upper abdominal pain. Patient has history of GERD, EtOH abuse, previous history of EtOH-induced pancreatitis he has history of renal cell CA for which she is status post resection of a portion of the left kidney and also has chronic kidney disease. Patient had 2 admissions in the fall of 2017 both times with what was felt to be alcohol-induced pancreatitis. He was not seen by GI during those admissions. Last CT scan in October 2017 showed acute pancreatitis with inflammatory stranding throughout the lesser sac and lymphadenopathy. There is secondary involvement of the duodenum, porta hepatis and anterior perirenal spaces. He also had trace bilateral pleural effusions. Patient says his current pain has been present over the past 3 weeks. 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 He was out at the beginning of the year for a right talar tendon repair. He describes the left-sided abdominal pain as a constant dull throbbing type of pain that waxes and wanes but never completely resolves throughout the day. He is also had an increase in heartburn over the past couple of weeks and a couple episodes of vomiting. No documented fever or chills. Appetite has been okay and he has not noticed any significant increase in his pain postprandially. Bowel habits have been normal without melena or hematochezia. He has been taking 800 mg of ibuprofen twice daily regularly for arthritis type pain. He also relates that he will take narcotics if he can get them and has used medications from his family etc. He stays on omeprazole 20 mg once daily chronically. He denies any daily alcohol abuse currently but says he still drinks some beers every  week. No procedures available in Epic.   Review of Systems Pertinent positive and negative review of systems were noted in the above HPI section.  All other review of systems was otherwise negative.  Outpatient Encounter Prescriptions as of 08/06/2016  Medication Sig  . ibuprofen (ADVIL,MOTRIN) 100 MG tablet Take 200 mg by mouth every 6 (six) hours as needed for fever.  . Multiple Vitamin (MULTIVITAMIN WITH MINERALS) TABS tablet Take 1 tablet by mouth daily.  [name] omeprazole (PRILOSEC) 20 MG capsule Take 1 capsule (20 mg total) by mouth 2 (two) times daily before a meal.  . [DISCONTINUED] HYDROcodone-acetaminophen (NORCO) 10-325 MG tablet Take 1 tablet by mouth every 6 (six) hours as needed. (Patient not taking: Reported on 05/10/2016)  . [DISCONTINUED] HYDROcodone-acetaminophen (NORCO/VICODIN) 5-325 MG tablet Take 1 tablet by mouth every 6 (six) hours as needed for moderate pain. (Patient not taking: Reported on 05/10/2016)  . [DISCONTINUED] methocarbamol (ROBAXIN) 500 MG tablet Take 1 tablet (500 mg total) by mouth every 8 (eight) hours as needed for muscle spasms. (Patient not taking: Reported on 05/10/2016)  . [DISCONTINUED] methocarbamol (ROBAXIN) 500 MG tablet Take 1 tablet (500 mg total) by mouth every 6 (six) hours as needed for muscle spasms. (Patient not taking: Reported on 05/10/2016)  . [DISCONTINUED] oxyCODONE-acetaminophen (PERCOCET/ROXICET) 5-325 MG tablet Take 1 tablet by mouth every 6 (six) hours as needed for severe pain. (Patient not taking: Reported on 05/10/2016)  . [DISCONTINUED] oxyCODONE-acetaminophen (ROXICET) 5-325 MG tablet Take 1-2 tablets by mouth every 4 (four) hours as needed for severe  pain. (Patient not taking: Reported on 05/10/2016)   No facility-administered encounter medications on file as of 08/06/2016.    Allergies  Allergen Reactions  . Sulfa Antibiotics Swelling   Patient Active Problem List   Diagnosis Date Noted  . Patellar tendon rupture 03/27/2016  .  Thrombosis of saphenous vein, left 11/28/2015  . Alcohol induced acute pancreatitis 11/23/2015  . Elevated lipase 11/23/2015  . Pancreatitis 11/23/2015  . Acute pancreatitis 11/23/2015  . Renal mass 02/15/2015  . Renal cell carcinoma ([place]) 02/15/2015  . Cellulitis and abscess of leg, left 07/14/2012  . GERD (gastroesophageal reflux disease) 07/14/2012  . Alcohol dependence ([place]) 05/25/2012   Social History   Social History  . Marital status: Divorced    Spouse name: N/A  . Number of children: 3  . Years of education: N/A   Occupational History  . metal worker    Social History Main Topics  . Smoking status: Current Every Day Smoker    Packs/day: 0.33    Years: 20.00    Types: Cigarettes  . Smokeless tobacco: Never Used  . Alcohol use Yes     Comment: Rare  . Drug use: No  . Sexual activity: No   Other Topics Concern  . Not on file   Social History Narrative  . No narrative on file    James [name] family history includes Emphysema in his father; Lung cancer in his mother.      Objective:    Vitals:   08/06/16 1055  BP: 140/82  Pulse: 84    Physical Exam well-developed white male in no acute distress, blood pressure 140/82 pulse 84, Height 6 foot 1, weight 185, BMI 24.4. HEENT ;nontraumatic normocephalic EOMI PERRLA sclera anicteric, Cardiovascular; regular rate and rhythm with S1-S2 no murmur rub or gallop, Pulmonary ;clear bilaterally, Abdomen; soft, is tender in the epigastrium and left upper quadrant there is no guarding, no palpable mass or hepatosplenomegaly he does have a large transverse incisional scar in the left upper quadrant, bowel sounds are present, Rectal ;exam not done, Extremities; no clubbing cyanosis or edema skin warm and dry, Neuropsych; mood and affect appropriate       Assessment & Plan:   #15 56 year old white male with previous history of EtOH-induced pancreatitis on more than one occasion who presents with three-week history of left  upper quadrant abdominal pain. This has not been associated with fever or chills or diarrhea. He did have 1 episode of vomiting. Currently not having any post prandial exacerbation. Etiology of patient's pain is not entirely clear, I have a high suspicion for recurrent pancreatitis, also need to consider NSAID-induced gastropathy, and other intra-abdominal inflammatory process in a patient status post partial left nephrectomy for renal cell CA in 2016. #2 chronic GERD #3 history of renal cell CA status post partial left nephrectomy 2016 Number for chronic kidney disease  Plan; CBC with differential, CMET, amylase, lipase, Schedule for CT of the abdomen and pelvis with contrast Discussed need for complete alcohol abstinence with the patient. Discussed need to decrease chronic NSAID use. He says he has chronic pain and needs to take something. He is also requesting pain medication today for his abdominal pain. Prescription for tramadol 50 mg every 6 hours when necessary for pain #30 and no refills. We discussed obtaining appointment at pain management for his chronic pain. Increase omeprazole to 20 mg by mouth twice a day Further plans pending results of labs and imaging. Patient will be established with Dr. Silverio Decamp.  Once acute issues are sorted out, he will also need colon cancer screening at some point.  Amy S Esterwood PA-C 08/06/2016   Cc: No ref. provider found

## 2016-08-07 ENCOUNTER — Telehealth: Payer: Self-pay | Admitting: *Deleted

## 2016-08-07 ENCOUNTER — Other Ambulatory Visit: Payer: Self-pay | Admitting: *Deleted

## 2016-08-07 DIAGNOSIS — R1084 Generalized abdominal pain: Secondary | ICD-10-CM

## 2016-08-07 DIAGNOSIS — Z8719 Personal history of other diseases of the digestive system: Secondary | ICD-10-CM

## 2016-08-07 NOTE — Telephone Encounter (Signed)
Dr. Silverio Decamp reviewed Amy's note from 08-06-2016.  I advised her that the insurance company denied the CT ABD & Pelvis with contrast.  The insurance company suggested  an Ultrasound first.  Dr. Silverio Decamp suggested I order the [place] and if the pancreas isn't visualized on the [place], we may order a CT scan again .  I called the patient and had to leave a message.

## 2016-08-07 NOTE — Progress Notes (Signed)
Reviewed and agree with documentation and assessment and plan. Insurance did not approve CT abd & pelvis, will request abdominal ultrasound. If unable to visualize adequately on abdominal ultrasound, will need to seek re approval for CT abd & pelvis from insurance  K. Denzil Magnuson , MD

## 2016-08-07 NOTE — Telephone Encounter (Signed)
Alonza Bogus PA reviewed the office notes from [facility] PA and agreed to call the insurance company and she did a peer to peer.  They did deny the claim.  See notes from previously today 08-07-2016.

## 2016-08-07 NOTE — Telephone Encounter (Signed)
Spoke to the patient. Advised him the insurance company denied the CT scan. They suggested an Ultrasound.  The patient expressed concern over cost. I told him the insurance company suggested the [place] so they should cover it.  He complained about pain and the Ultram not helping at all. I told him to keep taking the Ultram. He kept saying, "why take something that does not work".  I told him we would not prescribe anything else for pain at this time.  He knows to go to [facility] on Friday 08-09-2016 at 8:45 am.

## 2016-08-07 NOTE — Telephone Encounter (Signed)
Patient calling in stating that he called his insurance company and they told him that our office has not contacted them. Best # [phone]

## 2016-08-07 NOTE — Telephone Encounter (Signed)
Spoke to patient this am. Advised we have to get approval code for the prior authorization from the insurance company. I told him Amy called after hours yesterday. I suggested we cancel the CT for tomorrow, Thursday. He wasn't happy and said he thinks " he will just die". I told him we could more than likely get the appoval tomorrow and we could schedule it for Friday.  Advised him I will call [facility]  and try to get appt for Friday, 6-15.

## 2016-08-07 NOTE — Telephone Encounter (Signed)
The patient came into the office and I spoke to him. He feels he was lied to and no one here cares about him. He started loudly voicing his concerns.  He also complained that the Ultram medication for pain he was presrcibed to by Amy was nothing more than a stronger Tylenol.  He said the insurance company told him they do not have enough information to approve the CT scan.  I advised him that another PA here is going to call the insurance company to do the peer to peer today. I advised I will call him when I know the outcome.

## 2016-08-08 ENCOUNTER — Ambulatory Visit (HOSPITAL_COMMUNITY): Admission: RE | Admit: 2016-08-08 | Payer: BLUE CROSS/BLUE SHIELD | Source: Ambulatory Visit

## 2016-08-09 ENCOUNTER — Ambulatory Visit (HOSPITAL_COMMUNITY)
Admission: RE | Admit: 2016-08-09 | Discharge: 2016-08-09 | Disposition: A | Discharge status: Home / Self Care | Payer: BLUE CROSS/BLUE SHIELD | Source: Ambulatory Visit | Attending: Physician Assistant | Admitting: Physician Assistant

## 2016-08-09 ENCOUNTER — Other Ambulatory Visit: Payer: Self-pay

## 2016-08-09 DIAGNOSIS — R1084 Generalized abdominal pain: Secondary | ICD-10-CM | POA: Diagnosis not present

## 2016-08-09 DIAGNOSIS — Z8719 Personal history of other diseases of the digestive system: Secondary | ICD-10-CM | POA: Insufficient documentation

## 2016-08-09 MED ORDER — HYDROCODONE-ACETAMINOPHEN 5-325 MG PO TABS: 1.0000 | ORAL_TABLET | Freq: Four times a day (QID) | ORAL | 0 refills | Status: DC | PRN

## 2016-08-09 MED FILL — HYDROCODON-APAP 5-325: 5-325 | 10 days supply | Qty: 40 | Fill #0

## 2016-08-12 ENCOUNTER — Telehealth: Payer: Self-pay | Admitting: Physician Assistant

## 2016-08-12 ENCOUNTER — Encounter (HOSPITAL_COMMUNITY): Payer: Self-pay

## 2016-08-12 ENCOUNTER — Ambulatory Visit (HOSPITAL_COMMUNITY)
Admission: RE | Admit: 2016-08-12 | Discharge: 2016-08-12 | Disposition: A | Discharge status: Home / Self Care | Payer: BLUE CROSS/BLUE SHIELD | Source: Ambulatory Visit | Attending: Physician Assistant | Admitting: Physician Assistant

## 2016-08-12 DIAGNOSIS — R1012 Left upper quadrant pain: Secondary | ICD-10-CM | POA: Insufficient documentation

## 2016-08-12 DIAGNOSIS — Z85528 Personal history of other malignant neoplasm of kidney: Secondary | ICD-10-CM | POA: Diagnosis not present

## 2016-08-12 DIAGNOSIS — I700 Atherosclerosis of aorta: Secondary | ICD-10-CM | POA: Diagnosis not present

## 2016-08-12 DIAGNOSIS — Z8719 Personal history of other diseases of the digestive system: Secondary | ICD-10-CM | POA: Diagnosis not present

## 2016-08-12 DIAGNOSIS — K219 Gastro-esophageal reflux disease without esophagitis: Secondary | ICD-10-CM | POA: Diagnosis not present

## 2016-08-12 MED ORDER — IOPAMIDOL (ISOVUE-300) INJECTION 61%
INTRAVENOUS | Status: AC
  Administered 2016-08-12: 100 mL
  Filled 2016-08-12: qty 100

## 2016-08-12 NOTE — Telephone Encounter (Signed)
Advised. See Imaging results-EGD 08/16/16

## 2016-08-13 ENCOUNTER — Encounter: Payer: Self-pay | Admitting: Gastroenterology

## 2016-08-13 ENCOUNTER — Ambulatory Visit (AMBULATORY_SURGERY_CENTER): Payer: Self-pay

## 2016-08-13 VITALS — Ht 73.0 in | Wt 184.8 lb

## 2016-08-13 DIAGNOSIS — R1012 Left upper quadrant pain: Secondary | ICD-10-CM

## 2016-08-13 NOTE — Progress Notes (Signed)
Denies allergies to eggs or soy products. Denies complication of anesthesia or sedation. Denies use of weight loss medication. Denies use of O2.   Emmi instructions given for colonoscopy.  

## 2016-08-14 ENCOUNTER — Ambulatory Visit (HOSPITAL_COMMUNITY): Payer: BLUE CROSS/BLUE SHIELD

## 2016-08-16 ENCOUNTER — Ambulatory Visit (AMBULATORY_SURGERY_CENTER): Payer: BLUE CROSS/BLUE SHIELD | Admitting: Gastroenterology

## 2016-08-16 ENCOUNTER — Telehealth: Payer: Self-pay

## 2016-08-16 ENCOUNTER — Encounter: Payer: Self-pay | Admitting: Gastroenterology

## 2016-08-16 VITALS — BP 128/77 | HR 57 | Temp 98.4°F | Resp 16 | Ht 73.0 in | Wt 184.0 lb

## 2016-08-16 DIAGNOSIS — R1013 Epigastric pain: Secondary | ICD-10-CM | POA: Diagnosis not present

## 2016-08-16 DIAGNOSIS — R1012 Left upper quadrant pain: Secondary | ICD-10-CM

## 2016-08-16 MED ORDER — SODIUM CHLORIDE 0.9 % IV SOLN: 500.0000 mL | INTRAVENOUS | Status: DC

## 2016-08-16 NOTE — Progress Notes (Signed)
To recovery, report to [place], RN, VSS 

## 2016-08-16 NOTE — Telephone Encounter (Signed)
Referral [facility] [place]. 10/15/16 at 2:30 pm. Records, imaging and procedure notes faxed. Written information will be sent by [facility].

## 2016-08-16 NOTE — Telephone Encounter (Signed)
I informed patient his results in recovery. He is being disrespectful and unreasonable despite the timely appropriate care he received. Please discharge patient from the practice.

## 2016-08-16 NOTE — Op Note (Signed)
[facility] Patient Name: James [name] Procedure Date: 08/16/2016 9:52 AM MRN: [id number] Endoscopist: Mauri Pole , MD Age: 56 Referring MD:  Date of Birth: 24-Feb-1961 Gender: Male Account #: [id number] Procedure:                Upper GI endoscopy Indications:              Epigastric abdominal pain, Abdominal pain in the                            left upper quadrant Medicines:                Monitored Anesthesia Care Procedure:                Pre-Anesthesia Assessment:                           - Prior to the procedure, a History and Physical                            was performed, and patient medications and                            allergies were reviewed. The patient's tolerance of                            previous anesthesia was also reviewed. The risks                            and benefits of the procedure and the sedation                            options and risks were discussed with the patient.                            All questions were answered, and informed consent                            was obtained. Prior Anticoagulants: The patient has                            taken no previous anticoagulant or antiplatelet                            agents. ASA Grade Assessment: III - A patient with                            severe systemic disease. After reviewing the risks                            and benefits, the patient was deemed in                            satisfactory condition to undergo the procedure.  After obtaining informed consent, the endoscope was                            passed under direct vision. Throughout the                            procedure, the patient's blood pressure, pulse, and                            oxygen saturations were monitored continuously. The                            Endoscope was introduced through the mouth, and                            advanced to the second part of  duodenum. The upper                            GI endoscopy was accomplished without difficulty.                            The patient tolerated the procedure well. Scope In: Scope Out: Findings:                 A small hiatal hernia was present.                           No gross lesions were noted in the entire esophagus.                           The stomach was normal.                           The examined duodenum was normal. Complications:            No immediate complications. Estimated Blood Loss:     Estimated blood loss: none. Impression:               - Small hiatal hernia.                           - No gross lesions in esophagus.                           - Normal stomach.                           - Normal examined duodenum.                           - No specimens collected. Recommendation:           - Patient has a contact number available for                            emergencies. The signs and symptoms of potential  delayed complications were discussed with the                            patient. Return to normal activities tomorrow.                            Written discharge instructions were provided to the                            patient.                           - Resume previous diet.                           - Continue present medications.                           - Return to GI clinic PRN. Mauri Pole, MD 08/16/2016 10:07:54 AM This report has been signed electronically.

## 2016-08-16 NOTE — Patient Instructions (Signed)
YOU HAD AN ENDOSCOPIC PROCEDURE TODAY AT THE [facility]:   Refer to the procedure report that was given to you for any specific questions about what was found during the examination.  If the procedure report does not answer your questions, please call your gastroenterologist to clarify.  If you requested that your care partner not be given the details of your procedure findings, then the procedure report has been included in a sealed envelope for you to review at your convenience later.  YOU SHOULD EXPECT: Some feelings of bloating in the abdomen. Passage of more gas than usual.  Walking can help get rid of the air that was put into your GI tract during the procedure and reduce the bloating. If you had a lower endoscopy (such as a colonoscopy or flexible sigmoidoscopy) you may notice spotting of blood in your stool or on the toilet paper. If you underwent a bowel prep for your procedure, you may not have a normal bowel movement for a few days.  Please Note:  You might notice some irritation and congestion in your nose or some drainage.  This is from the oxygen used during your procedure.  There is no need for concern and it should clear up in a day or so.  SYMPTOMS TO REPORT IMMEDIATELY:   Following upper endoscopy (EGD)  Vomiting of blood or coffee ground material  New chest pain or pain under the shoulder blades  Painful or persistently difficult swallowing  New shortness of breath  Fever of 100F or higher  Black, tarry-looking stools  For urgent or emergent issues, a gastroenterologist can be reached at any hour by calling [phone].   DIET:  We do recommend a small meal at first, but then you may proceed to your regular diet.  Drink plenty of fluids but you should avoid alcoholic beverages for 24 hours.  ACTIVITY:  You should plan to take it easy for the rest of today and you should NOT DRIVE or use heavy machinery until tomorrow (because of the sedation medicines used  during the test).    FOLLOW UP: Our staff will call the number listed on your records the next business day following your procedure to check on you and address any questions or concerns that you may have regarding the information given to you following your procedure. If we do not reach you, we will leave a message.  However, if you are feeling well and you are not experiencing any problems, there is no need to return our call.  We will assume that you have returned to your regular daily activities without incident.  If any biopsies were taken you will be contacted by phone or by letter within the next 1-3 weeks.  Please call us at [phone] if you have not heard about the biopsies in 3 weeks.    SIGNATURES/CONFIDENTIALITY: You and/or your care partner have signed paperwork which will be entered into your electronic medical record.  These signatures attest to the fact that that the information above on your After Visit Summary has been reviewed and is understood.  Full responsibility of the confidentiality of this discharge information lies with you and/or your care-partner. 

## 2016-08-16 NOTE — Progress Notes (Signed)
Pt's states no medical or surgical changes since previsit or office visit. maw 

## 2016-08-16 NOTE — Telephone Encounter (Signed)
Patient walked into the office today to complain about the practice. He was angry and demanding to speak with a supervisor and that he has been lied to. He was very confrontational.  He alleges that he has been lied to over and over.  He says it started with the the CT scan that was denied by his insurance.  He was notified that the scan needed to be cancelled because his insurance denied it.  He says that he spoke with his insurance and that it was pending not denied.  A Peer to Peer review was performed and the scan was denied by his insurance. He was shown the email trail about the denial from the Supervisor of Centralized Scheduling  That the CT was denied and needed to be cancelled. He says that we were lying and that it was not denied.     He was scheduled for an appointment with Dr. Havery Moros on 08/29/16, his mother called on 07/29/16 and rescheduled this to [facility] on 08/06/16. He said he was being lied to that his mother made both appointments on the same day. I showed him in the computer the event details and that the appts were rescheduled when his mother called and that cancelled the appt with Dr. Havery Moros.  Dr. Silverio Decamp was the supervising MD of the day and his EGD was scheduled with her.  He states that he was not told this and that Dr.Armbruster was his doctor.  He met Dr. Silverio Decamp upstairs today for the first time and almost walked out and not allow her to perform the EGD because "I don't deal with those people". He reports that he decided to continue with the appt because "I'd come so far".  He says that she did not speak with him in recovery.  He wants to know why nobody can find an answer for his pain.  I attempted to explain that they are looking for the cause and that so far with the testing he has had they do not have an answer.  I offered to schedule an office visit with Dr. Silverio Decamp, he refused. He wanted me to reschedule the appt with Dr. Havery Moros, I explained that I could not do  that without permission from both. He thought that was ridiculous.  I explained our office policy for any  MD changes that both MDs had to agree. I asked for his reason for the change "because I don't want to see her, put down what you want". I advised patient that I would send the question to both MDs.    Patient has since called back and spoke with the PCCs up front he told her that he had a letter with the July 5th appt on it, Alyse Low attempted to explain again that he did have an appt and that it was rescheduled. He told her he was coming back up to the office to bring the letter and get his appt date with Dr. Havery Moros, she again explained the policy that both MDs need to approve and Dr. Havery Moros is not in the office today.   Patient was confrontational with staff when he came into the office on 08/07/16 as well.

## 2016-08-16 NOTE — Progress Notes (Signed)
Patient very angry upon awakening. Patient states that [place] ied to him about his procedure.  He stated that his insurance company would pay for everything.  Dr. Silverio Decamp spoke with him at length about this.   I called Lorriane Shire who is taking over for our usual precert person, and she stated that he had a $3,500 deductible.  His EGD was preventative, and not covered until deductible met.  I informed patient and he was very angry.  He stated that he will never come back here.  Patient did not listen to discharge instructions at all.  His mom signed the papers.  Dr. Silverio Decamp aware.

## 2016-08-16 NOTE — Progress Notes (Signed)
Pt has a flat affect.  He seems nervous and worried.  maw

## 2016-08-19 ENCOUNTER — Telehealth: Payer: Self-pay | Admitting: *Deleted

## 2016-08-19 NOTE — Telephone Encounter (Signed)
  Follow up Call-  Call back number 08/16/2016  Post procedure Call Back phone  # [phone] hm  Permission to leave phone message Yes  Some recent data might be hidden     No answer at # given.  No VM to leave message.

## 2016-08-26 ENCOUNTER — Telehealth: Payer: Self-pay | Admitting: Gastroenterology

## 2016-08-26 NOTE — Telephone Encounter (Signed)
Patient dismissed from [facility] by Harl Bowie MD , effective June . Dismissal letter sent out by certified / registered mail. rmf

## 2016-08-26 NOTE — Telephone Encounter (Signed)
Received signed domestic return receipt verifying delivery of certified letter on August 23, 2016. Article number [id number] [id number]

## 2016-08-29 ENCOUNTER — Ambulatory Visit: Payer: Self-pay | Admitting: Gastroenterology

## 2016-10-22 MED FILL — CYCLOBENZAPRINE 5 MG TABLET: 5 | 10 days supply | Qty: 30 | Fill #0

## 2016-11-12 MED FILL — GABAPENTIN 300 MG CAPSULE: 300 | 30 days supply | Qty: 90 | Fill #0

## 2016-12-12 MED FILL — GABAPENTIN 300 MG CAPSULE: 300 | 30 days supply | Qty: 270 | Fill #0

## 2016-12-16 MED FILL — OMEPRAZOLE 20 MG CAP: 20 | 30 days supply | Qty: 60 | Fill #1

## 2016-12-30 DIAGNOSIS — G588 Other specified mononeuropathies: Secondary | ICD-10-CM | POA: Insufficient documentation

## 2017-01-21 MED FILL — traMADol HCL 50 MG TABS: 50 | 5 days supply | Qty: 40 | Fill #0

## 2017-01-28 MED FILL — CYCLOBENZAPRINE 5 MG TABLET: 5 | 10 days supply | Qty: 30 | Fill #0

## 2017-02-05 MED FILL — OMEPRAZOLE 20 MG CAP: 20 | 30 days supply | Qty: 60 | Fill #2

## 2017-02-05 MED FILL — GABAPENTIN 300 MG CAPSULE: 300 | 30 days supply | Qty: 90 | Fill #1

## 2017-02-11 MED FILL — TOPIRAMATE 50 MG TABLET: 50 | 30 days supply | Qty: 30 | Fill #0

## 2017-02-11 MED FILL — NUCYNTA 50 MG TABLET: 50 | 15 days supply | Qty: 45 | Fill #0

## 2017-02-24 MED FILL — GABAPENTIN 300 MG CAPSULE: 300 | 30 days supply | Qty: 270 | Fill #1

## 2017-05-07 MED FILL — GABAPENTIN 300 MG CAPSULE: 300 | 30 days supply | Qty: 270 | Fill #2

## 2017-05-13 MED FILL — tiZANidine HCL 4 MG TABS: 4 | 23 days supply | Qty: 90 | Fill #0

## 2017-05-14 ENCOUNTER — Other Ambulatory Visit: Payer: Self-pay

## 2017-05-14 ENCOUNTER — Ambulatory Visit (HOSPITAL_COMMUNITY)
Admission: EM | Admit: 2017-05-14 | Discharge: 2017-05-14 | Disposition: A | Discharge status: Home / Self Care | Payer: BLUE CROSS/BLUE SHIELD

## 2017-05-14 ENCOUNTER — Encounter: Payer: Self-pay | Admitting: Physician Assistant

## 2017-05-14 ENCOUNTER — Ambulatory Visit: Payer: BLUE CROSS/BLUE SHIELD | Admitting: Physician Assistant

## 2017-05-14 VITALS — BP 132/80 | HR 91 | Temp 98.4°F | Resp 16 | Ht 71.0 in | Wt 183.0 lb

## 2017-05-14 DIAGNOSIS — R1012 Left upper quadrant pain: Secondary | ICD-10-CM | POA: Diagnosis not present

## 2017-05-14 LAB — POCT URINALYSIS DIP (MANUAL ENTRY)
Bilirubin, UA: NEGATIVE
Blood, UA: NEGATIVE
Glucose, UA: NEGATIVE mg/dL
Ketones, POC UA: NEGATIVE mg/dL
Leukocytes, UA: NEGATIVE
Nitrite, UA: NEGATIVE
Protein Ur, POC: NEGATIVE mg/dL
Spec Grav, UA: <=1.005 — AB (ref 1.010–1.025)
Urobilinogen, UA: 0.2 E.U./dL
pH, UA: 5.5 (ref 5.0–8.0)

## 2017-05-14 LAB — POCT CBC
Granulocyte percent: 67.2 %G (ref 37–80)
HCT, POC: 44.5 % (ref 43.5–53.7)
Hemoglobin: 14.5 g/dL (ref 14.1–18.1)
Lymph, poc: 1.8 (ref 0.6–3.4)
MCH, POC: 28.2 pg (ref 27–31.2)
MCHC: 32.5 g/dL (ref 31.8–35.4)
MCV: 86.9 fL (ref 80–97)
MID (cbc): 0.4 (ref 0–0.9)
MPV: 9 fL (ref 0–99.8)
POC Granulocyte: 4.6 (ref 2–6.9)
POC LYMPH PERCENT: 27 %L (ref 10–50)
POC MID %: 5.8 %M (ref 0–12)
Platelet Count, POC: 229 10*3/uL (ref 142–424)
RBC: 5.12 M/uL (ref 4.69–6.13)
RDW, POC: 13.5 %
WBC: 6.8 10*3/uL (ref 4.6–10.2)

## 2017-05-14 LAB — AMYLASE: Amylase: 121 U/L (ref 31–124)

## 2017-05-14 LAB — POC MICROSCOPIC URINALYSIS (UMFC): Mucus: ABSENT

## 2017-05-14 LAB — LIPASE: Lipase: 31 U/L (ref 13–78)

## 2017-05-14 NOTE — Progress Notes (Signed)
James [name]  MRN: [id number] DOB: 23-Apr-1960  PCP: Patient, No Pcp Per  Subjective:  Pt is a 57 year old male PMH chronic pain, alcohol induced acute pancreatitis, renal cell carcinoma, alcohol dependence, GERD who presents to clinic for abdominal pain x 2 days.   Care team: Dr. Sheliah Mends, MD in the [facility].  He was seen there yesterday. HPI: "Patient is status post T10, T11, T12 intercostal nerve radio frequency ablation on the left on 02/05/2017. Patient presents today saying the procedure provided no significant pain relief. The patient has received the most relief in the past from trigger point injections around the scar area.  Yesterday he was dx with myofascial muscle pain, neuropathic pain and chronic abdominal pain and was administered several trigger point injections to LUQ along his incision site and asked to return in 6 weeks.   Today he states that yesterday's treatments gave him about 4-5 hours of pain relief, then LUQ pain returned. Pain is constant. Radiates to left low back.  He is afraid of recurrence of pancreatitis.  Denies fever, chills, back pain, urinary symptoms, n/v/d, constipation, diaphoresis.  Denies drinking problem. He drinks 2-3 beers/week. He does not have a healthy diet.   Review of Systems  Constitutional: Negative for chills, diaphoresis, fatigue and fever.  Cardiovascular: Negative for chest pain and palpitations.  Gastrointestinal: Positive for abdominal pain. Negative for diarrhea, nausea and vomiting.  Genitourinary: Negative for decreased urine volume, dysuria, enuresis, flank pain, frequency, hematuria and urgency.  Musculoskeletal: Negative for back pain.  Neurological: Negative for dizziness, light-headedness and headaches.    Patient Active Problem List   Diagnosis Date Noted  . Patellar tendon rupture 03/27/2016  . Thrombosis of saphenous vein, left 11/28/2015  . Alcohol induced acute pancreatitis 11/23/2015  .  Elevated lipase 11/23/2015  . Pancreatitis 11/23/2015  . Acute pancreatitis 11/23/2015  . Renal mass 02/15/2015  . Renal cell carcinoma ([place]) 02/15/2015  . Cellulitis and abscess of leg, left 07/14/2012  . GERD (gastroesophageal reflux disease) 07/14/2012  . Alcohol dependence ([place]) 05/25/2012    Current Outpatient Medications on File Prior to Visit  Medication Sig Dispense Refill  . gabapentin (NEURONTIN) 300 MG capsule Take 300 mg by mouth 3 (three) times daily.    [name] omeprazole (PRILOSEC) 20 MG capsule Take 1 capsule (20 mg total) by mouth 2 (two) times daily before a meal. 60 capsule 2  . tiZANidine (ZANAFLEX) 4 MG tablet Take 4 mg by mouth every 6 (six) hours as needed for muscle spasms.    [name] HYDROcodone-acetaminophen (NORCO/VICODIN) 5-325 MG tablet Take 1 tablet by mouth every 6 (six) hours as needed for moderate pain. 40 tablet 0  . ibuprofen (ADVIL,MOTRIN) 100 MG tablet Take 200 mg by mouth every 6 (six) hours as needed for fever.    . Multiple Vitamin (MULTIVITAMIN WITH MINERALS) TABS tablet Take 1 tablet by mouth daily.     Current Facility-Administered Medications on File Prior to Visit  Medication Dose Route Frequency Provider Last Rate Last Dose  . 0.9 %  sodium chloride infusion  500 mL Intravenous Continuous Nandigam, Venia Minks, MD        Allergies  Allergen Reactions  . Sulfa Antibiotics Swelling     Objective:  BP 132/80   Pulse 91   Temp 98.4 F (36.9 C) (Oral)   Resp 16   Ht _0  (1.803 m)   Wt 183 lb (83 kg)   SpO2 100%   BMI 25.52  kg/m   Physical Exam  Constitutional: He is oriented to person, place, and time and well-developed, well-nourished, and in no distress. No distress.  Cardiovascular: Normal rate, regular rhythm and normal heart sounds.  Abdominal: Soft. Normal appearance and bowel sounds are normal. There is tenderness in the epigastric area.  Neurological: He is alert and oriented to person, place, and time. GCS score is 15.  Skin: Skin  is warm and dry. Bruising noted.     Multiple small bruises along incision site. TTP along his scar.    Psychiatric: Mood, memory, affect and judgment normal.  Vitals reviewed.  Results for orders placed or performed in visit on 05/14/17  POCT CBC  Result Value Ref Range   WBC 6.8 4.6 - 10.2 K/uL   Lymph, poc 1.8 0.6 - 3.4   POC LYMPH PERCENT 27.0 10 - 50 %L   MID (cbc) 0.4 0 - 0.9   POC MID % 5.8 0 - 12 %M   POC Granulocyte 4.6 2 - 6.9   Granulocyte percent 67.2 37 - 80 %G   RBC 5.12 4.69 - 6.13 M/uL   Hemoglobin 14.5 14.1 - 18.1 g/dL   HCT, POC 44.5 43.5 - 53.7 %   MCV 86.9 80 - 97 fL   MCH, POC 28.2 27 - 31.2 pg   MCHC 32.5 31.8 - 35.4 g/dL   RDW, POC 13.5 %   Platelet Count, POC 229 142 - 424 K/uL   MPV 9.0 0 - 99.8 fL  POCT urinalysis dipstick  Result Value Ref Range   Color, UA yellow yellow   Clarity, UA clear clear   Glucose, UA negative negative mg/dL   Bilirubin, UA negative negative   Ketones, POC UA negative negative mg/dL   Spec Grav, UA <=1.005 (A) 1.010 - 1.025   Blood, UA negative negative   pH, UA 5.5 5.0 - 8.0   Protein Ur, POC negative negative mg/dL   Urobilinogen, UA 0.2 0.2 or 1.0 E.U./dL   Nitrite, UA Negative Negative   Leukocytes, UA Negative Negative  POCT Microscopic Urinalysis ([facility])  Result Value Ref Range   WBC,UR,HPF,POC None None WBC/hpf   RBC,UR,HPF,POC None None RBC/hpf   Bacteria None None, Too numerous to count   Mucus Absent Absent   Epithelial Cells, UR Per Microscopy None None, Too numerous to count cells/hpf    Assessment and Plan :  1. Abdominal pain, left upper quadrant - CMP14+EGFR - Amylase - Lipase - Lipid panel - POCT CBC - POCT urinalysis dipstick - POCT Microscopic Urinalysis ([facility]) - Pt is a 57 year old male PMH chronic pain, alcohol induced acute pancreatitis, renal cell carcinoma, alcohol dependence, GERD who presents to clinic for abdominal pain x 2 days. [name] and treated for  myofascial muscle pain with several trigger point injections to LUQ along his incision site and asked to return in 6 weeks. Pt's relief was short lived and he is concerned for pancreatitis. Will send out stat pancreatic enzymes and contact with results and plan. CBC and UA is negative. Mild findings on PE. He is also asking for pain medication. I denied this considering his history. Advised him to f/u with Dr. Evelene Croon.   Mercer Pod, PA-C  [facility] 05/14/2017 2:27 PM

## 2017-05-14 NOTE — Patient Instructions (Addendum)
  We will call you with the results of today's lab work as soon as it comes back.  Please follow-up with Dr. Evelene Croon.  Consider finding a primary care provider to have routine check ups.   Thank you for coming in today. I hope you feel we met your needs.  Feel free to call PCP if you have any questions or further requests.  Please consider signing up for MyChart if you do not already have it, as this is a great way to communicate with me.  Best,  Whitney McVey, PA-C   IF you received an x-ray today, you will receive an invoice from [facility]. Please contact [facility] at [phone] with questions or concerns regarding your invoice.   IF you received labwork today, you will receive an invoice from [facility]. Please contact [facility] at [phone] with questions or concerns regarding your invoice.   Our billing staff will not be able to assist you with questions regarding bills from these companies.  You will be contacted with the lab results as soon as they are available. The fastest way to get your results is to activate your My Chart account. Instructions are located on the last page of this paperwork. If you have not heard from [place] regarding the results in 2 weeks, please contact this office.

## 2017-05-15 ENCOUNTER — Telehealth: Payer: Self-pay | Admitting: Physician Assistant

## 2017-05-15 LAB — LIPID PANEL
Chol/HDL Ratio: 3.8 ratio (ref 0.0–5.0)
Cholesterol, Total: 229 mg/dL — ABNORMAL HIGH (ref 100–199)
HDL: 61 mg/dL (ref >39)
LDL Calculated: 129 mg/dL — ABNORMAL HIGH (ref 0–99)
Triglycerides: 194 mg/dL — ABNORMAL HIGH (ref 0–149)
VLDL Cholesterol Cal: 39 mg/dL (ref 5–40)

## 2017-05-15 LAB — CMP14+EGFR
ALT: 13 IU/L (ref 0–44)
AST: 20 IU/L (ref 0–40)
Albumin/Globulin Ratio: 1.7 (ref 1.2–2.2)
Albumin: 4.7 g/dL (ref 3.5–5.5)
Alkaline Phosphatase: 62 IU/L (ref 39–117)
BUN/Creatinine Ratio: 9 (ref 9–20)
BUN: 11 mg/dL (ref 6–24)
Bilirubin Total: 0.3 mg/dL (ref 0.0–1.2)
CO2: 20 mmol/L (ref 20–29)
Calcium: 9.7 mg/dL (ref 8.7–10.2)
Chloride: 103 mmol/L (ref 96–106)
Creatinine, Ser: 1.2 mg/dL (ref 0.76–1.27)
GFR calc Af Amer: 78 mL/min/{1.73_m2} (ref >59)
GFR calc non Af Amer: 67 mL/min/{1.73_m2} (ref >59)
Globulin, Total: 2.8 g/dL (ref 1.5–4.5)
Glucose: 107 mg/dL — ABNORMAL HIGH (ref 65–99)
Potassium: 4.1 mmol/L (ref 3.5–5.2)
Sodium: 140 mmol/L (ref 134–144)
Total Protein: 7.5 g/dL (ref 6.0–8.5)

## 2017-05-15 LAB — CBC WITH DIFFERENTIAL/PLATELET
Basophils Absolute: 0 10*3/uL (ref 0.0–0.2)
Basos: 0 %
EOS (ABSOLUTE): 0 10*3/uL (ref 0.0–0.4)
Eos: 1 %
Hematocrit: 42.7 % (ref 37.5–51.0)
Hemoglobin: 14.3 g/dL (ref 13.0–17.7)
Immature Grans (Abs): 0 10*3/uL (ref 0.0–0.1)
Immature Granulocytes: 0 %
Lymphocytes Absolute: 2 10*3/uL (ref 0.7–3.1)
Lymphs: 29 %
MCH: 28.5 pg (ref 26.6–33.0)
MCHC: 33.5 g/dL (ref 31.5–35.7)
MCV: 85 fL (ref 79–97)
Monocytes Absolute: 0.5 10*3/uL (ref 0.1–0.9)
Monocytes: 7 %
Neutrophils Absolute: 4.5 10*3/uL (ref 1.4–7.0)
Neutrophils: 63 %
Platelets: 247 10*3/uL (ref 150–379)
RBC: 5.01 x10E6/uL (ref 4.14–5.80)
RDW: 13.7 % (ref 12.3–15.4)
WBC: 7.1 10*3/uL (ref 3.4–10.8)

## 2017-05-15 NOTE — Telephone Encounter (Signed)
Patient checking status, please advise [phone]

## 2017-05-15 NOTE — Telephone Encounter (Signed)
Copied from [place] [id number]. Topic: Inquiry >> May 14, 2017  5:12 PM Oliver Pila B wrote: Reason for CRM: pt called to ask Dr. Magda Kiel what is the next step for him to deal w/ his pain after todays visit, call pt to advise  >> May 15, 2017  8:49 AM Cleaster Corin, NT wrote:  pt called to ask Dr. Magda Kiel what is the next step for him to deal w/ his pain after yesterdays visit, call pt to advise

## 2017-05-15 NOTE — Telephone Encounter (Signed)
Provider, please advise.  

## 2017-05-16 NOTE — Telephone Encounter (Signed)
Patient is calling and is very upset that he has not been contacted back. He states that [facility] mentioned a CT scan. He states he is in pain. Please contact patient.   [phone]

## 2017-05-19 ENCOUNTER — Encounter: Payer: Self-pay | Admitting: Physician Assistant

## 2017-05-19 ENCOUNTER — Ambulatory Visit: Payer: BLUE CROSS/BLUE SHIELD | Admitting: Physician Assistant

## 2017-05-19 ENCOUNTER — Other Ambulatory Visit: Payer: Self-pay

## 2017-05-19 ENCOUNTER — Ambulatory Visit (HOSPITAL_COMMUNITY)
Admission: RE | Admit: 2017-05-19 | Discharge: 2017-05-19 | Disposition: A | Discharge status: Home / Self Care | Payer: BLUE CROSS/BLUE SHIELD | Source: Ambulatory Visit | Attending: Physician Assistant | Admitting: Physician Assistant

## 2017-05-19 ENCOUNTER — Ambulatory Visit: Payer: Self-pay

## 2017-05-19 VITALS — BP 111/67 | HR 92 | Temp 98.1°F | Resp 18 | Ht 71.0 in | Wt 177.6 lb

## 2017-05-19 DIAGNOSIS — R1012 Left upper quadrant pain: Secondary | ICD-10-CM | POA: Insufficient documentation

## 2017-05-19 DIAGNOSIS — I700 Atherosclerosis of aorta: Secondary | ICD-10-CM | POA: Insufficient documentation

## 2017-05-19 DIAGNOSIS — M5137 Other intervertebral disc degeneration, lumbosacral region: Secondary | ICD-10-CM | POA: Diagnosis not present

## 2017-05-19 DIAGNOSIS — Z905 Acquired absence of kidney: Secondary | ICD-10-CM | POA: Diagnosis not present

## 2017-05-19 LAB — POCT URINALYSIS DIP (MANUAL ENTRY)
Bilirubin, UA: NEGATIVE
Blood, UA: NEGATIVE
Glucose, UA: NEGATIVE mg/dL
Ketones, POC UA: NEGATIVE mg/dL
Leukocytes, UA: NEGATIVE
Nitrite, UA: NEGATIVE
Protein Ur, POC: NEGATIVE mg/dL
Spec Grav, UA: <=1.005 — AB (ref 1.010–1.025)
Urobilinogen, UA: 0.2 E.U./dL
pH, UA: 6.5 (ref 5.0–8.0)

## 2017-05-19 MED ORDER — IOPAMIDOL (ISOVUE-300) INJECTION 61%
100.0000 mL | Freq: Once | INTRAVENOUS | Status: AC | PRN
  Administered 2017-05-19: 100 mL via INTRAVENOUS

## 2017-05-19 MED ORDER — IOPAMIDOL (ISOVUE-300) INJECTION 61%
INTRAVENOUS | Status: AC
  Filled 2017-05-19: qty 100

## 2017-05-19 NOTE — Telephone Encounter (Signed)
Pt. States he saw Ms. McVey last week for this same thing - "stomach pain- I couldn't go to work. I hurt all weekend on my left side." States he does "not want to go back to the pain clinic. They have done everything they can for me." Requests an appointment today. Could not go to work today. Denies nausea, vomiting or diarrhea.  Reason for Disposition . [1] MODERATE pain (e.g., interferes with normal activities) AND [2] pain comes and goes (cramps) AND [3] present > 24 hours  (Exception: pain with Vomiting or Diarrhea - see that Guideline)  Answer Assessment - Initial Assessment Questions 1. LOCATION: "Where does it hurt?"      Left side of abdomen 2. RADIATION: "Does the pain shoot anywhere else?" (e.g., chest, back)     Hurts into back 3. ONSET: "When did the pain begin?" (Minutes, hours or days ago)      Got worse over the weekend 4. SUDDEN: "Gradual or sudden onset?"     Gradual 5. PATTERN "Does the pain come and go, or is it constant?"    - If constant: "Is it getting better, staying the same, or worsening?"      (Note: Constant means the pain never goes away completely; most serious pain is constant and it progresses)     - If intermittent: "How long does it last?" "Do you have pain now?"     (Note: Intermittent means the pain goes away completely between bouts)     Constant 6. SEVERITY: "How bad is the pain?"  (e.g., Scale 1-10; mild, moderate, or severe)    - MILD (1-3): doesn't interfere with normal activities, abdomen soft and not tender to touch     - MODERATE (4-7): interferes with normal activities or awakens from sleep, tender to touch     - SEVERE (8-10): excruciating pain, doubled over, unable to do any normal activities       9-10 7. RECURRENT SYMPTOM: "Have you ever had this type of abdominal pain before?" If so, ask: "When was the last time?" and "What happened that time?"      Yes 8. CAUSE: "What do you think is causing the abdominal pain?"     Unsure 9.  RELIEVING/AGGRAVATING FACTORS: "What makes it better or worse?" (e.g., movement, antacids, bowel movement)     Nothing 10. OTHER SYMPTOMS: "Has there been any vomiting, diarrhea, constipation, or urine problems?"       No  Protocols used: ABDOMINAL PAIN - MALE-A-AH

## 2017-05-19 NOTE — Progress Notes (Signed)
PRIMARY CARE AT [facility] [address] [place] [address] [phone]  Date:  05/19/2017   Name:  James [name]   DOB:  01-01-1961   MRN:  [id number]  PCP:  Patient, No Pcp Per    History of Present Illness:  James [name] is a 57 y.o. male patient who presents to PCP with  Chief Complaint  Patient presents with  . Abdominal Pain     Patient returns after 5 days for follow up of abdominal pain.  Patient was last seen by McVey after he hadmuscle pain of luq.  He was dxd with myofascial muscle pain, neuropathic pain and chronic adominal pain, one day prior to her visit with McVey.  Trigger point injections were adminstered which offered hm no relief but for the first 4-5 hours of the procedure.   He denies fever, chills, back pain, or urinary symptoms. McVey performed extensive bloodwork which were negative and wnl with u dipstick, cmet, lipase, amylase, and cbc among others.  He returns today...     Care team: Dr. Sheliah Mends, MD in the [facility]. He was seen there yesterday. HPI: "Patient is status post T10, T11, T12 intercostal nerve radio frequency ablation on the left on 02/05/2017. Patient presents today saying the procedure provided no significant pain relief. The patient has received the most relief in the past from trigger point injections around the scar area.  Yesterday he was dx with myofascial muscle pain, neuropathic pain and chronic abdominal pain and was administered several trigger point injections to LUQ along his incision site and asked to return in 6 weeks.      Patient Active Problem List   Diagnosis Date Noted  . Patellar tendon rupture 03/27/2016  . Thrombosis of saphenous vein, left 11/28/2015  . Alcohol induced acute pancreatitis 11/23/2015  . Elevated lipase 11/23/2015  . Pancreatitis 11/23/2015  . Acute pancreatitis 11/23/2015  . Renal mass 02/15/2015  . Renal cell carcinoma ([place]) 02/15/2015  . Cellulitis and abscess of leg, left  07/14/2012  . GERD (gastroesophageal reflux disease) 07/14/2012  . Alcohol dependence ([place]) 05/25/2012    Past Medical History:  Diagnosis Date  . Arthritis    right knee right wrist   . Chronic kidney disease   . GERD (gastroesophageal reflux disease)   . Hypertension    pt states was treated with medication back in 2000; currently on no medication   . Left renal mass   . Melanoma ([facility]) dx'd 58yrs ago age 34   rt lat chest wall--surg only  . Pancreatitis   . Renal cancer ([place]) dx'd 01/2015   left nephrectomy  . Wears glasses     Past Surgical History:  Procedure Laterality Date  . ABDOMINAL SURGERY  2017   remove tumor cyst kidney  . COLONOSCOPY    . CYSTOSCOPY WITH RETROGRADE PYELOGRAM, URETEROSCOPY AND STENT PLACEMENT Left 07/19/2015   Procedure: LEFT RETROGRADE PYELOGRAM, LEFT URETEROSCOPY AND LEFT URETERAL STENT PLACEMENT;  Surgeon: Ardis Hughs, MD;  Location: [facility];  Service: Urology;  Laterality: Left;  . drained cyst      left kidney 10 months ago   . lymph nodes removed      right armpit secondary to melanoma   . NEPHRECTOMY Left 02/15/2015   Procedure: LEFT OPEN PARTIAL NEPHRECTOMY;  Surgeon: Ardis Hughs, MD;  Location: [facility];  Service: Urology;  Laterality: Left;  . PATELLAR TENDON REPAIR Right 03/27/2016   Procedure: RIGHT PATELLA TENDON REPAIR;  Surgeon: Marybelle Killings, MD;  Location: [facility];  Service: Orthopedics;  Laterality: Right;  . right armpit surgery     for melanoma    Social History   Tobacco Use  . Smoking status: Current Every Day Smoker    Packs/day: 0.33    Years: 20.00    Pack years: 6.60    Types: Cigarettes  . Smokeless tobacco: Never Used  Substance Use Topics  . Alcohol use: No  . Drug use: No    Family History  Problem Relation Age of Onset  . Lung cancer Mother        non-smoker  . Emphysema Father        smoker  . Colon cancer Neg Hx   . Esophageal cancer Neg Hx   . Rectal cancer Neg Hx   . Stomach cancer  Neg Hx     Allergies  Allergen Reactions  . Sulfa Antibiotics Swelling    Medication list has been reviewed and updated.  Current Outpatient Medications on File Prior to Visit  Medication Sig Dispense Refill  . gabapentin (NEURONTIN) 300 MG capsule Take 300 mg by mouth 3 (three) times daily.    [name] HYDROcodone-acetaminophen (NORCO/VICODIN) 5-325 MG tablet Take 1 tablet by mouth every 6 (six) hours as needed for moderate pain. 40 tablet 0  . ibuprofen (ADVIL,MOTRIN) 100 MG tablet Take 200 mg by mouth every 6 (six) hours as needed for fever.    . Multiple Vitamin (MULTIVITAMIN WITH MINERALS) TABS tablet Take 1 tablet by mouth daily.    [name] omeprazole (PRILOSEC) 20 MG capsule Take 1 capsule (20 mg total) by mouth 2 (two) times daily before a meal. 60 capsule 2  . tiZANidine (ZANAFLEX) 4 MG tablet Take 4 mg by mouth every 6 (six) hours as needed for muscle spasms.     Current Facility-Administered Medications on File Prior to Visit  Medication Dose Route Frequency Provider Last Rate Last Dose  . 0.9 %  sodium chloride infusion  500 mL Intravenous Continuous Nandigam, Kavitha V, MD        ROS ROS otherwise unremarkable unless listed above.  Physical Examination: There were no vitals taken for this visit. Ideal Body Weight:    Physical Exam   Assessment and Plan: James [name] is a 57 y.o. male who is here today  There are no diagnoses linked to this encounter.  Ivar Drape, PA-C [facility] 05/19/2017 2:02 PM

## 2017-05-19 NOTE — Progress Notes (Signed)
PRIMARY CARE AT [facility] [address] [place] [address] [phone]  Date:  05/19/2017   Name:  James [name]   DOB:  06/22/1960   MRN:  [id number]  PCP:  Patient, No Pcp Per    History of Present Illness:  James [name] is a 57 y.o. male patient who presents to PCP with  Chief Complaint  Patient presents with  . Abdominal Pain    seen by [facility] last wednesday or  thursday per pt.  Continued abd pain and has been trying not to go to ED but if Ms. English can't get help him he will be going to ED.     Patient returns after 5 days for follow up of abdominal pain.  Patient was last seen by [facility] after he hadmuscle pain of luq.  He was dxd with myofascial muscle pain, neuropathic pain and chronic adominal pain, one day prior to her visit with Mc[facility].  Trigger point injections were adminstered which offered hm no relief but for the first 4-5 hours of the procedure.   He denies fever, chills, back pain, or urinary symptoms. [facility] performed extensive bloodwork which were negative and wnl with u dipstick, cmet, lipase, amylase, and cbc among others.  He returns today... He notes that the pain feels even worsened.   He reports the radiofrequency ablation had helped for about 3 months.  Pain hurts with just putting his boot on.  No rash.  He denies any constipation.  No nausea.   At last visit with Dr. Evelene Croon opiate use was avoided due to poor compliance.  Over the last several years, multiple positive screenings including tetrahydrocannabiniol, and cocaine.   "Care team:Dr. Sheliah [facility]ends, [facility]. He was seen there yesterday. HPI: "Patient is status post T10, T11, T12 intercostal nerve radio frequency ablation on the left on 02/05/2017. Patient presents today saying the procedure provided no significant pain relief. The patient has received the most relief in the past from trigger point injections around the scar area." "Yesterday he was dx with myofascial muscle  pain, neuropathic pain and chronic abdominal painand wasadministered severaltrigger point injections to [place] his incision siteand asked to return in 6 weeks."         Patient Active Problem List   Diagnosis Date Noted  . Patellar tendon rupture 03/27/2016  . Thrombosis of saphenous vein, left 11/28/2015  . Alcohol induced acute pancreatitis 11/23/2015  . Elevated lipase       Patient Active Problem List   Diagnosis Date Noted  . Patellar tendon rupture 03/27/2016  . Thrombosis of saphenous vein, left 11/28/2015  . Alcohol induced acute pancreatitis 11/23/2015  . Elevated lipase 11/23/2015  . Pancreatitis 11/23/2015  . Acute pancreatitis 11/23/2015  . Renal mass 02/15/2015  . Renal cell carcinoma ([place]) 02/15/2015  . Cellulitis and abscess of leg, left 07/14/2012  . GERD (gastroesophageal reflux disease) 07/14/2012  . Alcohol dependence ([place]) 05/25/2012    Past Medical History:  Diagnosis Date  . Arthritis    right knee right wrist   . Chronic kidney disease   . GERD (gastroesophageal reflux disease)   . Hypertension    pt states was treated with medication back in 2000; currently on no medication   . Left renal mass   . Melanoma ([place]) dx'd 59yrs ago age 65   rt lat chest wall--surg only  . Pancreatitis   . Renal cancer ([place]) dx'd 01/2015   left nephrectomy  . Wears  glasses     Past Surgical History:  Procedure Laterality Date  . ABDOMINAL SURGERY  2017   remove tumor cyst kidney  . COLONOSCOPY    . CYSTOSCOPY WITH RETROGRADE PYELOGRAM, URETEROSCOPY AND STENT PLACEMENT Left 07/19/2015   Procedure: LEFT RETROGRADE PYELOGRAM, LEFT URETEROSCOPY AND LEFT URETERAL STENT PLACEMENT;  Surgeon: Ardis Hughs, MD;  Location: [facility];  Service: Urology;  Laterality: Left;  . drained cyst      left kidney 10 months ago   . lymph nodes removed      right armpit secondary to melanoma   . NEPHRECTOMY Left 02/15/2015   Procedure: LEFT OPEN PARTIAL  NEPHRECTOMY;  Surgeon: Ardis Hughs, MD;  Location: [facility];  Service: Urology;  Laterality: Left;  . PATELLAR TENDON REPAIR Right 03/27/2016   Procedure: RIGHT PATELLA TENDON REPAIR;  Surgeon: Marybelle Killings, MD;  Location: [facility];  Service: Orthopedics;  Laterality: Right;  . right armpit surgery     for melanoma    Social History   Tobacco Use  . Smoking status: Current Every Day Smoker    Packs/day: 0.33    Years: 20.00    Pack years: 6.60    Types: Cigarettes  . Smokeless tobacco: Never Used  Substance Use Topics  . Alcohol use: No  . Drug use: No    Family History  Problem Relation Age of Onset  . Lung cancer Mother        non-smoker  . Emphysema Father        smoker  . Colon cancer Neg Hx   . Esophageal cancer Neg Hx   . Rectal cancer Neg Hx   . Stomach cancer Neg Hx     Allergies  Allergen Reactions  . Sulfa Antibiotics Swelling    Medication list has been reviewed and updated.  Current Outpatient Medications on File Prior to Visit  Medication Sig Dispense Refill  . gabapentin (NEURONTIN) 300 MG capsule Take 300 mg by mouth 3 (three) times daily.    [name] HYDROcodone-acetaminophen (NORCO/VICODIN) 5-325 MG tablet Take 1 tablet by mouth every 6 (six) hours as needed for moderate pain. 40 tablet 0  . Multiple Vitamin (MULTIVITAMIN WITH MINERALS) TABS tablet Take 1 tablet by mouth daily.    [name] omeprazole (PRILOSEC) 20 MG capsule Take 1 capsule (20 mg total) by mouth 2 (two) times daily before a meal. 60 capsule 2  . tiZANidine (ZANAFLEX) 4 MG tablet Take 4 mg by mouth every 6 (six) hours as needed for muscle spasms.     Current Facility-Administered Medications on File Prior to Visit  Medication Dose Route Frequency Provider Last Rate Last Dose  . 0.9 %  sodium chloride infusion  500 mL Intravenous Continuous Nandigam, Kavitha V, MD        ROS ROS otherwise unremarkable unless listed above.  Physical Examination: BP 111/67 (BP Location: Right Arm, Patient  Position: Sitting, Cuff Size: Normal)   Pulse 92   Temp 98.1 F (36.7 C) (Oral)   Resp 18   Ht 5\' 11"  (1.803 m)   Wt 177 lb 9.6 oz (80.6 kg)   SpO2 98%   BMI 24.77 kg/m  Ideal Body Weight: Weight in (lb) to have BMI = 25: 178.9  Physical Exam  Constitutional: He is oriented to person, place, and time. He appears well-developed and well-nourished. No distress.  HENT:  Head: Normocephalic and atraumatic.  Eyes: Pupils are equal, round, and reactive to light. Conjunctivae and EOM are normal.  Cardiovascular: Normal rate.  Pulmonary/Chest: Effort normal. No respiratory distress.  Abdominal:  Left upper quadrant abdominal tenderness with gentle palpation.  No mass assessed.  Healing ecchymosis.   Neurological: He is alert and oriented to person, place, and time.  Skin: Skin is warm and dry. He is not diaphoretic.  Psychiatric: He has a normal mood and affect. His behavior is normal.    Results for orders placed or performed in visit on 05/19/17  POCT urinalysis dipstick  Result Value Ref Range   Color, UA yellow yellow   Clarity, UA clear clear   Glucose, UA negative negative mg/dL   Bilirubin, UA negative negative   Ketones, POC UA negative negative mg/dL   Spec Grav, UA <=1.005 (A) 1.010 - 1.025   Blood, UA negative negative   pH, UA 6.5 5.0 - 8.0   Protein Ur, POC negative negative mg/dL   Urobilinogen, UA 0.2 0.2 or 1.0 E.U./dL   Nitrite, UA Negative Negative   Leukocytes, UA Negative Negative     Assessment and Plan: James [name] is a 57 y.o. male who is here today for cc of  Chief Complaint  Patient presents with  . Abdominal Pain    seen by [facility] last wednesday or  thursday per pt.  Continued abd pain and has been trying not to go to ED but if Ms. English can't get help him he will be going to ED.  patient reports that he is clean at this time.  He would like a chance to perform uds.  I think this could be appropriate, however this should be done by his pain  management service.  Substance control database consistent and normal.   I have been able to obtain appt with with Dr. Evelene Croon.  Advised to follow up tomorrow morning.  He would liek a CT to insure that the cancer has not returned.  Unlikely with symptoms, but will perform this today.   LUQ abdominal pain - Plan: POCT urinalysis dipstick  Left upper quadrant pain - Plan: CT Abdomen Pelvis W Contrast  Ivar Drape, PA-C [facility] 3/26/20199:43 AM

## 2017-05-19 NOTE — Patient Instructions (Addendum)
  You have an appointment with James [name] at [address], [place] office which you have previously been.  The appointment is at 945am.  Please make sure that you attend, and arrive early. You can take tylenol for your pain at this time.  I want to insure that if they get a urine drug test, that this is negative.  Please continue to take your gabapentin as prescribed.     IF you received an x-ray today, you will receive an invoice from [facility]. Please contact [facility] at [phone] with questions or concerns regarding your invoice.   IF you received labwork today, you will receive an invoice from [facility]. Please contact [facility] at [phone] with questions or concerns regarding your invoice.   Our billing staff will not be able to assist you with questions regarding bills from these companies.  You will be contacted with the lab results as soon as they are available. The fastest way to get your results is to activate your My Chart account. Instructions are located on the last page of this paperwork. If you have not heard from [place] regarding the results in 2 weeks, please contact this office.

## 2017-05-19 NOTE — Progress Notes (Signed)
PRIMARY CARE AT [facility] [place] [address], [place] [address] [phone]  Date:  05/19/2017   Name:  KHAN CHURA   DOB:  05-27-1960   MRN:  [id number]  PCP:  Patient, No Pcp Per    History of Present Illness:  LEMMIE VANLANEN is a 57 y.o. male patient who presents to PCP with  Chief Complaint  Patient presents with  . Abdominal Pain    seen by [facility] last wednesday or  thursday per pt.  Continued abd pain and has been trying not to go to ED but if Ms. English can't get help him he will be going to ED.       Patient Active Problem List   Diagnosis Date Noted  . Patellar tendon rupture 03/27/2016  . Thrombosis of saphenous vein, left 11/28/2015  . Alcohol induced acute pancreatitis 11/23/2015  . Elevated lipase 11/23/2015  . Pancreatitis 11/23/2015  . Acute pancreatitis 11/23/2015  . Renal mass 02/15/2015  . Renal cell carcinoma ([place]) 02/15/2015  . Cellulitis and abscess of leg, left 07/14/2012  . GERD (gastroesophageal reflux disease) 07/14/2012  . Alcohol dependence ([place]) 05/25/2012    Past Medical History:  Diagnosis Date  . Arthritis    right knee right wrist   . Chronic kidney disease   . GERD (gastroesophageal reflux disease)   . Hypertension    pt states was treated with medication back in 2000; currently on no medication   . Left renal mass   . Melanoma ([place]) dx'd 4yrs ago age 40   rt lat chest wall--surg only  . Pancreatitis   . Renal cancer ([place]) dx'd 01/2015   left nephrectomy  . Wears glasses     Past Surgical History:  Procedure Laterality Date  . ABDOMINAL SURGERY  2017   remove tumor cyst kidney  . COLONOSCOPY    . CYSTOSCOPY WITH RETROGRADE PYELOGRAM, URETEROSCOPY AND STENT PLACEMENT Left 07/19/2015   Procedure: LEFT RETROGRADE PYELOGRAM, LEFT URETEROSCOPY AND LEFT URETERAL STENT PLACEMENT;  Surgeon: Ardis Hughs, MD;  [facility];  Service: Urology;  Laterality: Left;  . drained cyst      left kidney 10 months ago   . lymph  nodes removed      right armpit secondary to melanoma   . NEPHRECTOMY Left 02/15/2015   Procedure: LEFT OPEN PARTIAL NEPHRECTOMY;  Surgeon: Ardis Hughs, MD;  [facility];  Service: Urology;  Laterality: Left;  . PATELLAR TENDON REPAIR Right 03/27/2016   Procedure: RIGHT PATELLA TENDON REPAIR;  Surgeon: Marybelle Killings, MD;  Location: [facility];  Service: Orthopedics;  Laterality: Right;  . right armpit surgery     for melanoma    Social History   Tobacco Use  . Smoking status: Current Every Day Smoker    Packs/day: 0.33    Years: 20.00    Pack years: 6.60    Types: Cigarettes  . Smokeless tobacco: Never Used  Substance Use Topics  . Alcohol use: No  . Drug use: No    Family History  Problem Relation Age of Onset  . Lung cancer Mother        non-smoker  . Emphysema Father        smoker  . Colon cancer Neg Hx   . Esophageal cancer Neg Hx   . Rectal cancer Neg Hx   . Stomach cancer Neg Hx     Allergies  Allergen Reactions  . Sulfa Antibiotics Swelling    Medication list  has been reviewed and updated.  Current Outpatient Medications on File Prior to Visit  Medication Sig Dispense Refill  . gabapentin (NEURONTIN) 300 MG capsule Take 300 mg by mouth 3 (three) times daily.    [name] HYDROcodone-acetaminophen (NORCO/VICODIN) 5-325 MG tablet Take 1 tablet by mouth every 6 (six) hours as needed for moderate pain. 40 tablet 0  . Multiple Vitamin (MULTIVITAMIN WITH MINERALS) TABS tablet Take 1 tablet by mouth daily.    [name] omeprazole (PRILOSEC) 20 MG capsule Take 1 capsule (20 mg total) by mouth 2 (two) times daily before a meal. 60 capsule 2  . tiZANidine (ZANAFLEX) 4 MG tablet Take 4 mg by mouth every 6 (six) hours as needed for muscle spasms.     Current Facility-Administered Medications on File Prior to Visit  Medication Dose Route Frequency Provider Last Rate Last Dose  . 0.9 %  sodium chloride infusion  500 mL Intravenous Continuous Nandigam, Kavitha V, MD         ROS ROS otherwise unremarkable unless listed above.  Physical Examination: BP 111/67 (BP Location: Right Arm, Patient Position: Sitting, Cuff Size: Normal)   Pulse 92   Temp 98.1 F (36.7 C) (Oral)   Resp 18   Ht 5\' 11"  (1.803 m)   Wt 177 lb 9.6 oz (80.6 kg)   SpO2 98%   BMI 24.77 kg/m  Ideal Body Weight: Weight in (lb) to have BMI = 25: 178.9  Physical Exam   Assessment and Plan: KAMARIAN SAHAKIAN is a 57 y.o. male who is here today  There are no diagnoses linked to this encounter.  Ivar Drape, PA-C [facility] 05/19/2017 2:09 PM

## 2017-05-19 NOTE — Telephone Encounter (Signed)
Pt and I did not discuss CT scan. He needs to come back and be seen if he has further concerns. I advised him to f/u with his pain management doctor, Dr. Sheliah Mends in the [place].

## 2017-05-19 NOTE — Telephone Encounter (Signed)
Patient seen today by Ivar Drape, PA-C.

## 2017-05-20 ENCOUNTER — Telehealth: Payer: Self-pay | Admitting: Physician Assistant

## 2017-05-20 MED FILL — CELECOXIB 100 MG CAPSULE: 100 | 30 days supply | Qty: 60 | Fill #0

## 2017-05-20 NOTE — Telephone Encounter (Signed)
Phone call never sent to me. Contacted patient of results.  He was headed to the pain service appt.

## 2017-05-20 NOTE — Telephone Encounter (Signed)
Initiated retro-auth for STAT CT abdomen and pelvis performed on 05/19/17 for patient. I spoke with [place] with [facility] as well as their nurse reviewer but this case has not been approved and is under review. The nurse stated they will need a NP, PA, or MD to call back to do a peer to peer. She stated they are needing a more definitive diagnosis to see what exactly is being suspected for exam. Please advise. AIM can be reached at [phone]. Member's ID number is [id number] and DOB is 1961/01/27. Thanks.

## 2017-05-20 NOTE — Telephone Encounter (Signed)
Pt called in to check on results of CT. Please call him when available at [phone].

## 2017-05-23 ENCOUNTER — Telehealth: Payer: Self-pay | Admitting: Physician Assistant

## 2017-05-23 NOTE — Telephone Encounter (Signed)
He has had the ct, so we should see if we can clear this.

## 2017-05-23 NOTE — Telephone Encounter (Signed)
Received fax from [facility] stating they are denying prior auth for CT Abdomen and Pelvis. A peer to peer can still be done as mentioned in previous message. Please advise.

## 2017-05-27 NOTE — Telephone Encounter (Signed)
Patient had CT scan before a prior authorization was obtained, so we had to do a retro authorization to try to get insurance to cover this after it had already been performed. In order for insurance to cover their part for this scan, an authorization has to be obtained. I tried to get this by talking with the nurse with [facility] but they said it would have to go to a peer to peer review now and they are needing a NP, PA, or MD to call and speak with one of their physicians.

## 2017-05-28 NOTE — Telephone Encounter (Signed)
Please advise for peer to peer. AIM can be reached at [phone]. Member's ID number is [id number] and DOB is Oct 24, 1960. Thanks.

## 2017-05-28 NOTE — Telephone Encounter (Signed)
That's fine.  I'll await the contact information to call for this peer to peer.

## 2017-06-04 ENCOUNTER — Encounter: Payer: Self-pay | Admitting: Physician Assistant

## 2017-06-24 MED FILL — GABAPENTIN 300 MG CAPSULE: 300 | 30 days supply | Qty: 360 | Fill #0

## 2017-06-24 MED FILL — tiZANidine HCL 4 MG TABS: 4 | 30 days supply | Qty: 90 | Fill #0

## 2017-07-28 ENCOUNTER — Encounter (HOSPITAL_COMMUNITY): Payer: Self-pay | Admitting: Emergency Medicine

## 2017-07-28 ENCOUNTER — Emergency Department (HOSPITAL_COMMUNITY)
Admission: EM | Admit: 2017-07-28 | Discharge: 2017-07-28 | Disposition: A | Discharge status: Home / Self Care | Payer: BLUE CROSS/BLUE SHIELD | Attending: Emergency Medicine | Admitting: Emergency Medicine

## 2017-07-28 ENCOUNTER — Emergency Department (HOSPITAL_COMMUNITY): Payer: BLUE CROSS/BLUE SHIELD

## 2017-07-28 DIAGNOSIS — F17210 Nicotine dependence, cigarettes, uncomplicated: Secondary | ICD-10-CM | POA: Insufficient documentation

## 2017-07-28 DIAGNOSIS — R109 Unspecified abdominal pain: Secondary | ICD-10-CM | POA: Diagnosis not present

## 2017-07-28 DIAGNOSIS — G8929 Other chronic pain: Secondary | ICD-10-CM | POA: Diagnosis not present

## 2017-07-28 DIAGNOSIS — Z79899 Other long term (current) drug therapy: Secondary | ICD-10-CM | POA: Diagnosis not present

## 2017-07-28 DIAGNOSIS — Z85528 Personal history of other malignant neoplasm of kidney: Secondary | ICD-10-CM | POA: Insufficient documentation

## 2017-07-28 LAB — COMPREHENSIVE METABOLIC PANEL
ALT: 16 U/L — ABNORMAL LOW (ref 17–63)
AST: 27 U/L (ref 15–41)
Albumin: 4.4 g/dL (ref 3.5–5.0)
Alkaline Phosphatase: 73 U/L (ref 38–126)
Anion gap: 10 (ref 5–15)
BUN: 11 mg/dL (ref 6–20)
CO2: 21 mmol/L — ABNORMAL LOW (ref 22–32)
Calcium: 9.1 mg/dL (ref 8.9–10.3)
Chloride: 108 mmol/L (ref 101–111)
Creatinine, Ser: 1.07 mg/dL (ref 0.61–1.24)
GFR calc Af Amer: >60 mL/min (ref >60)
GFR calc non Af Amer: >60 mL/min (ref >60)
Glucose, Bld: 149 mg/dL — ABNORMAL HIGH (ref 65–99)
Potassium: 3.8 mmol/L (ref 3.5–5.1)
Sodium: 139 mmol/L (ref 135–145)
Total Bilirubin: 0.8 mg/dL (ref 0.3–1.2)
Total Protein: 7.7 g/dL (ref 6.5–8.1)

## 2017-07-28 LAB — URINALYSIS, ROUTINE W REFLEX MICROSCOPIC
Bacteria, UA: NONE SEEN
Bilirubin Urine: NEGATIVE
Glucose, UA: NEGATIVE mg/dL
Hgb urine dipstick: NEGATIVE
Ketones, ur: 5 mg/dL — AB
Leukocytes, UA: NEGATIVE
Nitrite: NEGATIVE
Protein, ur: 30 mg/dL — AB
Specific Gravity, Urine: 1.026 (ref 1.005–1.030)
pH: 5 (ref 5.0–8.0)

## 2017-07-28 LAB — CBC
HCT: 44.5 % (ref 39.0–52.0)
Hemoglobin: 15 g/dL (ref 13.0–17.0)
MCH: 29 pg (ref 26.0–34.0)
MCHC: 33.7 g/dL (ref 30.0–36.0)
MCV: 86.1 fL (ref 78.0–100.0)
Platelets: 281 10*3/uL (ref 150–400)
RBC: 5.17 MIL/uL (ref 4.22–5.81)
RDW: 14.6 % (ref 11.5–15.5)
WBC: 6.6 10*3/uL (ref 4.0–10.5)

## 2017-07-28 LAB — LIPASE, BLOOD: Lipase: 33 U/L (ref 11–51)

## 2017-07-28 LAB — TROPONIN I: Troponin I: <0.03 ng/mL (ref <0.03)

## 2017-07-28 MED ORDER — SODIUM CHLORIDE 0.9 % IV BOLUS
500.0000 mL | Freq: Once | INTRAVENOUS | Status: AC
  Administered 2017-07-28: 500 mL via INTRAVENOUS

## 2017-07-28 MED ORDER — ONDANSETRON HCL 4 MG/2ML IJ SOLN
4.0000 mg | Freq: Once | INTRAMUSCULAR | Status: AC
  Administered 2017-07-28: 4 mg via INTRAVENOUS
  Filled 2017-07-28: qty 2

## 2017-07-28 MED ORDER — HYDROCODONE-ACETAMINOPHEN 5-325 MG PO TABS
1.0000 | ORAL_TABLET | Freq: Once | ORAL | Status: AC
  Administered 2017-07-28: 1 via ORAL
  Filled 2017-07-28: qty 1

## 2017-07-28 MED ORDER — MORPHINE SULFATE (PF) 4 MG/ML IV SOLN
4.0000 mg | Freq: Once | INTRAVENOUS | Status: AC
  Administered 2017-07-28: 4 mg via INTRAVENOUS
  Filled 2017-07-28: qty 1

## 2017-07-28 MED ORDER — IOPAMIDOL (ISOVUE-370) INJECTION 76%
100.0000 mL | Freq: Once | INTRAVENOUS | Status: AC | PRN
  Administered 2017-07-28: 100 mL via INTRAVENOUS

## 2017-07-28 MED ORDER — IOPAMIDOL (ISOVUE-370) INJECTION 76%
INTRAVENOUS | Status: AC
  Filled 2017-07-28: qty 100

## 2017-07-28 NOTE — Discharge Instructions (Signed)
Please continue taking your normally scheduled pain medications for your chronic abdominal pain, and please follow-up with your pain management doctor for further treatment of your chronic abdominal pain.  Please follow-up with your primary care doctor as Arty scheduled in 1 week for reevaluation and return to the ER for any new or worsening symptoms including chest pain, shortness of breath, worsening abdominal pain, persistent vomiting, fevers.

## 2017-07-28 NOTE — ED Triage Notes (Signed)
Pt reports that has left abd pains at incision site and sees a pain management clinic. Reports that this morning he got up and couldn't put his work boots on due to pain so couldn't make it to work. Hx pancreatitis but reports cut back a lot on alcohol consumption so doesnt think it is that.

## 2017-07-28 NOTE — ED Notes (Signed)
No answer from lobby  

## 2017-07-28 NOTE — ED Provider Notes (Signed)
[facility] DEPT Provider Note   CSN: [id number] Arrival date & time: 07/28/17  1146     History   Chief Complaint Chief Complaint  Patient presents with  . Abdominal Pain    HPI James [name] is a 57 y.o. male.  HPI   Patient is a 57 year old male with history of CKD, GERD, hypertension, pancreatitis, renal cancer s/P partial left nephrectomy who presents the emergency department today to be evaluated for left upper quadrant and left lower quadrant abdominal pain that has been intermittent for the last several months but seem to be worse this morning.  States the pain this morning is consistent with his chronic pain and rates it 9/10.  Feels like a cramping pain along his incision site.  Patient had surgery to this area about 1 year ago and since then has had chronic pain.  Sees pain management for this pain is on gabapentin which she states is not improving his symptoms.  He also tried taking Aleve with no relief.  This morning had some nausea with one episode of vomiting he describes as "mucus ". Also states that pain took his breath away this morning, but since then the feeling of shortness of breath has improved. Denies any further episodes or continued nausea. He denies chest pain. Denies any associated diarrhea, constipation, blood in his vomit or stool.  No urinary symptoms.  He of pancreatitis but states this does not feel similar. Denies leg pain/swelling, hemoptysis, recent surgery/trauma, recent long travel, hormone use, or hx of DVT/PE.   Upon review of prior records patient was seen for similar pain by his primary doctor on 05/19/2017.  At that time he also complained of abdominal pain just with putting his boots on.  He had a CT scan completed at that time which showed no identifiable cause for patients pain.  This note also details information from prior visit with pain management. He is currently seen by Dr. Evelene Croon with pain management.  Dr.  Evelene Croon had avoided opiates due to patients h/o poor compliance and positive drug screenings. He has had T10, T11, T12 intercostal nerve radio frequency ablation on the left on 02/05/2017 without significant pain relief. He has received the most relief in the past from trigger point injections around the scar area. He has been dx with myofascial muscle pain, neuropathic pain and chronic abdominal pain.  Past Medical History:  Diagnosis Date  . Arthritis    right knee right wrist   . Chronic kidney disease   . GERD (gastroesophageal reflux disease)   . Hypertension    pt states was treated with medication back in 2000; currently on no medication   . Left renal mass   . Melanoma ([place]) dx'd 67yrs ago age 19   rt lat chest wall--surg only  . Pancreatitis   . Renal cancer ([place]) dx'd 01/2015   left nephrectomy  . Wears glasses     Patient Active Problem List   Diagnosis Date Noted  . Patellar tendon rupture 03/27/2016  . Thrombosis of saphenous vein, left 11/28/2015  . Alcohol induced acute pancreatitis 11/23/2015  . Elevated lipase 11/23/2015  . Pancreatitis 11/23/2015  . Acute pancreatitis 11/23/2015  . Renal mass 02/15/2015  . Renal cell carcinoma ([place]) 02/15/2015  . Cellulitis and abscess of leg, left 07/14/2012  . GERD (gastroesophageal reflux disease) 07/14/2012  . Alcohol dependence ([place]) 05/25/2012    Past Surgical History:  Procedure Laterality Date  . ABDOMINAL SURGERY  2017  remove tumor cyst kidney  . COLONOSCOPY    . CYSTOSCOPY WITH RETROGRADE PYELOGRAM, URETEROSCOPY AND STENT PLACEMENT Left 07/19/2015   Procedure: LEFT RETROGRADE PYELOGRAM, LEFT URETEROSCOPY AND LEFT URETERAL STENT PLACEMENT;  Surgeon: Ardis Hughs, MD;  Location: [facility];  Service: Urology;  Laterality: Left;  . drained cyst      left kidney 10 months ago   . lymph nodes removed      right armpit secondary to melanoma   . NEPHRECTOMY Left 02/15/2015   Procedure: LEFT OPEN PARTIAL  NEPHRECTOMY;  Surgeon: Ardis Hughs, MD;  Location: [facility];  Service: Urology;  Laterality: Left;  . PATELLAR TENDON REPAIR Right 03/27/2016   Procedure: RIGHT PATELLA TENDON REPAIR;  Surgeon: Marybelle Killings, MD;  Location: [facility];  Service: Orthopedics;  Laterality: Right;  . right armpit surgery     for melanoma        Home Medications    Prior to Admission medications   Medication Sig Start Date End Date Taking? Authorizing Provider  Esomeprazole Magnesium (NEXIUM PO) Take 1 tablet by mouth daily.   Yes [provider]  gabapentin (NEURONTIN) 300 MG capsule Take 900 mg by mouth 3 (three) times daily.    Yes [provider]  naproxen sodium (ALEVE) 220 MG tablet Take 440 mg by mouth 4 (four) times daily as needed (pain).   Yes [provider]  HYDROcodone-acetaminophen (NORCO/VICODIN) 5-325 MG tablet Take 1 tablet by mouth every 6 (six) hours as needed for moderate pain. Patient not taking: Reported on 07/28/2017 08/09/16   Esterwood, Amy S, PA-C  omeprazole (PRILOSEC) 20 MG capsule Take 1 capsule (20 mg total) by mouth 2 (two) times daily before a meal. Patient not taking: Reported on 07/28/2017 08/06/16   Alfredia Ferguson, PA-C    Family History Family History  Problem Relation Age of Onset  . Lung cancer Mother        non-smoker  . Emphysema Father        smoker  . Colon cancer Neg Hx   . Esophageal cancer Neg Hx   . Rectal cancer Neg Hx   . Stomach cancer Neg Hx     Social History Social History   Tobacco Use  . Smoking status: Current Every Day Smoker    Packs/day: 0.33    Years: 20.00    Pack years: 6.60    Types: Cigarettes  . Smokeless tobacco: Never Used  Substance Use Topics  . Alcohol use: Yes  . Drug use: No     Allergies   Sulfa antibiotics   Review of Systems Review of Systems  Constitutional: Negative for fever.  HENT: Negative for ear pain and sore throat.   Eyes: Negative for pain and visual disturbance.    Respiratory: Negative for cough and shortness of breath.   Cardiovascular: Negative for chest pain and leg swelling.  Gastrointestinal: Positive for abdominal pain (chronic), nausea and vomiting. Negative for blood in stool, constipation and diarrhea.  Genitourinary: Negative for dysuria, flank pain, frequency, hematuria and urgency.  Musculoskeletal: Negative for myalgias.  Skin: Negative for rash.  Neurological: Negative for dizziness, light-headedness, numbness and headaches.  All other systems reviewed and are negative.  Physical Exam Updated Vital Signs BP 132/86   Pulse 79   Temp 97.6 F (36.4 C) (Oral)   Resp 18   Ht 6' (1.829 m)   Wt 88.5 kg (195 lb)   SpO2 99%   BMI 26.45 kg/m  Physical Exam  Constitutional: He appears well-developed and well-nourished.  Non-toxic appearance. He does not appear ill. No distress.  Appears well on exam. Sleeping comfortably when I enter the room.   HENT:  Head: Normocephalic and atraumatic.  Mouth/Throat: Oropharynx is clear and moist.  Eyes: Conjunctivae are normal. No scleral icterus.  Neck: Neck supple.  Cardiovascular: Normal rate, regular rhythm, normal heart sounds and intact distal pulses.  No murmur heard. Pulmonary/Chest: Effort normal and breath sounds normal. No stridor. No respiratory distress. He has no wheezes. He has no rales.  Abdominal: Soft. There is no CVA tenderness, no tenderness at McBurney's point and negative Murphy's sign.  TTP along scar line on the LUQ and LLQ with guarding. BS present. No rigidity.   Musculoskeletal: He exhibits no edema.  No calf TTP, erythema, swelling.  Neurological: He is alert.  Skin: Skin is warm and dry. Capillary refill takes less than 2 seconds.  Psychiatric: He has a normal mood and affect.  Nursing note and vitals reviewed.  ED Treatments / Results  Labs (all labs ordered are listed, but only abnormal results are displayed) Labs Reviewed  COMPREHENSIVE METABOLIC PANEL -  Abnormal; Notable for the following components:      Result Value   CO2 21 (*)    Glucose, Bld 149 (*)    ALT 16 (*)    All other components within normal limits  URINALYSIS, ROUTINE W REFLEX MICROSCOPIC - Abnormal; Notable for the following components:   Color, Urine AMBER (*)    Ketones, ur 5 (*)    Protein, ur 30 (*)    All other components within normal limits  LIPASE, BLOOD  CBC  TROPONIN I    EKG EKG Interpretation  Date/Time:  Monday July 28 2017 15:47:03 EDT Ventricular Rate:  65 PR Interval:    QRS Duration: 89 QT Interval:  423 QTC Calculation: 440 R Axis:   36 Text Interpretation:  Sinus rhythm no acute st/ts similar to prior 9/17 except slower rate now Confirmed by Aletta Edouard [phone]) on 07/28/2017 3:55:35 PM Also confirmed by Aletta Edouard [phone]), editor [place], Jeannetta Nap [phone])  on 07/28/2017 4:03:48 PM   Radiology Dg Chest 2 View  Result Date: 07/28/2017 CLINICAL DATA:  57 year old male with abdominal pain and vomiting today. EXAM: CHEST - 2 VIEW COMPARISON:  Chest radiographs 06/27/2015 and earlier. CT Abdomen and Pelvis 05/19/2017 FINDINGS: Lung volumes and mediastinal contours remain normal. Visualized tracheal air column is within normal limits. No pneumothorax or pneumoperitoneum. Both lungs appear clear. Negative visible bowel gas pattern. No acute osseous abnormality identified. IMPRESSION: Negative.  No acute cardiopulmonary abnormality. Electronically Signed   By: Genevie Ann M.D.   On: 07/28/2017 14:16   Ct Angio Chest Pe W And/or Wo Contrast  Result Date: 07/28/2017 CLINICAL DATA:  Left abdominal pain. History of pancreatitis. Prior abdominal surgery. EXAM: CT ANGIOGRAPHY CHEST CT ABDOMEN AND PELVIS WITH CONTRAST TECHNIQUE: Multidetector CT imaging of the chest was performed using the standard protocol during bolus administration of intravenous contrast. Multiplanar CT image reconstructions and MIPs were obtained to evaluate the vascular anatomy.  Multidetector CT imaging of the abdomen and pelvis was performed using the standard protocol during bolus administration of intravenous contrast. CONTRAST:  156mL ISOVUE-370 IOPAMIDOL (ISOVUE-370) INJECTION 76% COMPARISON:  Abdominopelvic CT 05/19/2017 and 08/12/2016. FINDINGS: CTA CHEST FINDINGS Cardiovascular: The pulmonary arteries are satisfactorily opacified with contrast to the level of the subsegmental branches. There is no evidence of acute pulmonary embolism. There is atherosclerosis of  the aorta, great vessels and coronary arteries. The heart size is normal. There is no pericardial effusion. Mediastinum/Nodes: There are scattered small mediastinal lymph nodes, including an 11 mm high left paratracheal nodule on image 7/10 which is located posterior to the left thyroid lobe. In the appropriate clinical context, this could reflect a parathyroid lesion. There is no hilar or axillary lymphadenopathy. The thyroid gland, trachea and esophagus demonstrate no significant findings. Lungs/Pleura: There is no pleural effusion or pneumothorax. There is mild emphysema with mild dependent atelectasis in both lungs. No confluent airspace opacity, endobronchial lesion or suspicious pulmonary nodule. Musculoskeletal/Chest wall: There is no chest wall mass or suspicious osseous finding. Sternoclavicular degenerative changes are present bilaterally. Review of the MIP images confirms the above findings. CT ABDOMEN and PELVIS FINDINGS Hepatobiliary: The liver is normal in density without focal abnormality. The gallbladder is contracted without surrounding inflammation or calcified stones. No biliary dilatation. Pancreas: Unremarkable. No pancreatic ductal dilatation or surrounding inflammatory changes. Spleen: Normal in size without focal abnormality. Adrenals/Urinary Tract: Both adrenal glands appear normal. There is stable cortical scarring in the interpolar region of the left kidney from previous partial nephrectomy. The  right kidney appears normal. No evidence of urinary tract calculus or hydronephrosis. The bladder appears normal. Stomach/Bowel: No evidence of bowel wall thickening, distention or surrounding inflammatory change. The appendix appears normal. Vascular/Lymphatic: There are no enlarged abdominal or pelvic lymph nodes. Aortic and branch vessel atherosclerosis. No acute vascular findings. Reproductive: The prostate gland and seminal vesicles appear stable. Other: No evidence of abdominal wall mass or hernia. No ascites. Musculoskeletal: No acute or significant osseous findings. Mild lumbar spine degenerative changes. Review of the MIP images confirms the above findings. IMPRESSION: 1. No evidence of acute pulmonary embolism or other acute chest process. 2. No acute abdominal findings. No evidence of recurrent pancreatitis. 3. Stable appearance of the left kidney status post partial nephrectomy. 4.  Aortic Atherosclerosis (ICD10-I70.0). 5. Small nodule along the left tracheoesophageal groove at the thoracic inlet, likely a lymph node, although potentially parathyroid in origin. Electronically Signed   By: Richardean Sale M.D.   On: 07/28/2017 15:32   Ct Abdomen Pelvis W Contrast  Result Date: 07/28/2017 CLINICAL DATA:  Left abdominal pain. History of pancreatitis. Prior abdominal surgery. EXAM: CT ANGIOGRAPHY CHEST CT ABDOMEN AND PELVIS WITH CONTRAST TECHNIQUE: Multidetector CT imaging of the chest was performed using the standard protocol during bolus administration of intravenous contrast. Multiplanar CT image reconstructions and MIPs were obtained to evaluate the vascular anatomy. Multidetector CT imaging of the abdomen and pelvis was performed using the standard protocol during bolus administration of intravenous contrast. CONTRAST:  162mL ISOVUE-370 IOPAMIDOL (ISOVUE-370) INJECTION 76% COMPARISON:  Abdominopelvic CT 05/19/2017 and 08/12/2016. FINDINGS: CTA CHEST FINDINGS Cardiovascular: The pulmonary arteries are  satisfactorily opacified with contrast to the level of the subsegmental branches. There is no evidence of acute pulmonary embolism. There is atherosclerosis of the aorta, great vessels and coronary arteries. The heart size is normal. There is no pericardial effusion. Mediastinum/Nodes: There are scattered small mediastinal lymph nodes, including an 11 mm high left paratracheal nodule on image 7/10 which is located posterior to the left thyroid lobe. In the appropriate clinical context, this could reflect a parathyroid lesion. There is no hilar or axillary lymphadenopathy. The thyroid gland, trachea and esophagus demonstrate no significant findings. Lungs/Pleura: There is no pleural effusion or pneumothorax. There is mild emphysema with mild dependent atelectasis in both lungs. No confluent airspace opacity, endobronchial lesion or suspicious pulmonary  nodule. Musculoskeletal/Chest wall: There is no chest wall mass or suspicious osseous finding. Sternoclavicular degenerative changes are present bilaterally. Review of the MIP images confirms the above findings. CT ABDOMEN and PELVIS FINDINGS Hepatobiliary: The liver is normal in density without focal abnormality. The gallbladder is contracted without surrounding inflammation or calcified stones. No biliary dilatation. Pancreas: Unremarkable. No pancreatic ductal dilatation or surrounding inflammatory changes. Spleen: Normal in size without focal abnormality. Adrenals/Urinary Tract: Both adrenal glands appear normal. There is stable cortical scarring in the interpolar region of the left kidney from previous partial nephrectomy. The right kidney appears normal. No evidence of urinary tract calculus or hydronephrosis. The bladder appears normal. Stomach/Bowel: No evidence of bowel wall thickening, distention or surrounding inflammatory change. The appendix appears normal. Vascular/Lymphatic: There are no enlarged abdominal or pelvic lymph nodes. Aortic and branch vessel  atherosclerosis. No acute vascular findings. Reproductive: The prostate gland and seminal vesicles appear stable. Other: No evidence of abdominal wall mass or hernia. No ascites. Musculoskeletal: No acute or significant osseous findings. Mild lumbar spine degenerative changes. Review of the MIP images confirms the above findings. IMPRESSION: 1. No evidence of acute pulmonary embolism or other acute chest process. 2. No acute abdominal findings. No evidence of recurrent pancreatitis. 3. Stable appearance of the left kidney status post partial nephrectomy. 4.  Aortic Atherosclerosis (ICD10-I70.0). 5. Small nodule along the left tracheoesophageal groove at the thoracic inlet, likely a lymph node, although potentially parathyroid in origin. Electronically Signed   By: Richardean Sale M.D.   On: 07/28/2017 15:32    Procedures Procedures (including critical care time)  Medications Ordered in ED Medications  iopamidol (ISOVUE-370) 76 % injection (has no administration in time range)  morphine 4 MG/ML injection 4 mg (has no administration in time range)  HYDROcodone-acetaminophen (NORCO/VICODIN) 5-325 MG per tablet 1 tablet (1 tablet Oral Given 07/28/17 1441)  ondansetron (ZOFRAN) injection 4 mg (4 mg Intravenous Given 07/28/17 1440)  sodium chloride 0.9 % bolus 500 mL (500 mLs Intravenous New Bag/Given 07/28/17 1441)  iopamidol (ISOVUE-370) 76 % injection 100 mL (100 mLs Intravenous Contrast Given 07/28/17 1457)     Initial Impression / Assessment and Plan / ED Course  I have reviewed the triage vital signs and the nursing notes.  Pertinent labs & imaging results that were available during my care of the patient were reviewed by me and considered in my medical decision making (see chart for details).  Discussed pt presentation and exam findings with Dr. Melina Copa, who agrees with the workup and plan for dc with outpt f/u.  Final Clinical Impressions(s) / ED Diagnoses   Final diagnoses:  Chronic abdominal  pain   Patient with abdominal pain to left upper quadrant and left lower quadrant along prior surgical scar.  Has history of chronic abdominal pain in this area and has had negative work-up previously as noted in HPI.  His vital signs are stable in the ED and he is afebrile.   CBC without leukocytosis or anemia. CMP with normal kidney and liver function. Lipase negative, doubt pancreatitis. Ua negative for UTI. Positive for ketonuria and proteinuria. CT abd/pelvis negative for acute intra-abdominal pathology at this time. CT chest r/o PE given h/o CA and c/o SOB. CT chest negative for acute pulmonary embolism. CXR negative for pneumonia, pneumothorax, widened mediastinum.  No cardiomegaly. EKG normal sinus rhythm.  No ST-T wave changes to suggest ischemia or infarction.  Unchanged from prior. Trop negative.  No cardiomegaly or evidence of fluid overload on exam  to suggest acute heart failure. PT has no h/o this.  Given patient's negative work-up and pain present to the incision site, so feel that his symptoms are likely consistent with his chronic abdominal pain along his scar.  Do not feel that his reports of shallow breathing or of any emergent etiology at this time given he has had stable vital signs, normal chest x-ray, troponin, EKG and negative CT chest.  He was given pain medication and Zofran in the ED and was able to tolerate p.o.  His abdominal exam reveals a nonsurgical abdomen at this time.  Advised him that he needs to follow-up with his pain management doctor for further recommendations with regards to management of his chronic abdominal pain.  Advised to follow-up with his PCP as well for reevaluation as already scheduled.  Also gave referral to gastroenterology should his symptoms persist.  Advised return to ER for any new or worsening symptoms in the meantime.  Patient voiced understanding and agrees with plan.  All questions answered.  ED Discharge Orders    None       Rodney Booze, [place] 07/28/17 1613    Hayden Rasmussen, MD 07/29/17 [phone]

## 2017-07-28 NOTE — ED Notes (Signed)
Discharge instructions reviewed with patient. Patient verbalizes understanding. VSS.   

## 2017-08-25 MED FILL — tiZANidine HCL 4 MG TABS: 4 | 30 days supply | Qty: 90 | Fill #1

## 2017-09-29 MED FILL — GABAPENTIN 300 MG CAPSULE: 300 | 30 days supply | Qty: 360 | Fill #1

## 2018-03-09 MED FILL — GABAPENTIN 300 MG CAPSULE: 300 | 30 days supply | Qty: 360 | Fill #2

## 2018-04-03 ENCOUNTER — Other Ambulatory Visit: Payer: Self-pay

## 2018-04-03 ENCOUNTER — Encounter (HOSPITAL_COMMUNITY): Payer: Self-pay

## 2018-04-03 ENCOUNTER — Emergency Department (HOSPITAL_COMMUNITY)
Admission: EM | Admit: 2018-04-03 | Discharge: 2018-04-03 | Disposition: A | Discharge status: Home / Self Care | Payer: BLUE CROSS/BLUE SHIELD | Attending: Emergency Medicine | Admitting: Emergency Medicine

## 2018-04-03 DIAGNOSIS — Z905 Acquired absence of kidney: Secondary | ICD-10-CM | POA: Insufficient documentation

## 2018-04-03 DIAGNOSIS — R197 Diarrhea, unspecified: Secondary | ICD-10-CM | POA: Insufficient documentation

## 2018-04-03 DIAGNOSIS — M791 Myalgia, unspecified site: Secondary | ICD-10-CM | POA: Insufficient documentation

## 2018-04-03 DIAGNOSIS — F1123 Opioid dependence with withdrawal: Secondary | ICD-10-CM

## 2018-04-03 DIAGNOSIS — N189 Chronic kidney disease, unspecified: Secondary | ICD-10-CM | POA: Insufficient documentation

## 2018-04-03 DIAGNOSIS — I129 Hypertensive chronic kidney disease with stage 1 through stage 4 chronic kidney disease, or unspecified chronic kidney disease: Secondary | ICD-10-CM | POA: Insufficient documentation

## 2018-04-03 DIAGNOSIS — Z85820 Personal history of malignant melanoma of skin: Secondary | ICD-10-CM | POA: Insufficient documentation

## 2018-04-03 DIAGNOSIS — F1110 Opioid abuse, uncomplicated: Secondary | ICD-10-CM

## 2018-04-03 DIAGNOSIS — F17210 Nicotine dependence, cigarettes, uncomplicated: Secondary | ICD-10-CM | POA: Insufficient documentation

## 2018-04-03 DIAGNOSIS — R1010 Upper abdominal pain, unspecified: Secondary | ICD-10-CM | POA: Insufficient documentation

## 2018-04-03 DIAGNOSIS — Z79899 Other long term (current) drug therapy: Secondary | ICD-10-CM | POA: Insufficient documentation

## 2018-04-03 DIAGNOSIS — F1193 Opioid use, unspecified with withdrawal: Secondary | ICD-10-CM

## 2018-04-03 DIAGNOSIS — Z8553 Personal history of malignant neoplasm of renal pelvis: Secondary | ICD-10-CM | POA: Insufficient documentation

## 2018-04-03 MED ORDER — CLONIDINE HCL 0.1 MG PO TABS: ORAL_TABLET | ORAL | 0 refills | Status: DC

## 2018-04-03 MED ORDER — ONDANSETRON 4 MG PO TBDP: 4.0000 mg | ORAL_TABLET | Freq: Three times a day (TID) | ORAL | 0 refills | Status: DC | PRN

## 2018-04-03 MED ORDER — DICYCLOMINE HCL 10 MG PO CAPS
10.0000 mg | ORAL_CAPSULE | Freq: Once | ORAL | Status: AC
  Administered 2018-04-03: 10 mg via ORAL
  Filled 2018-04-03: qty 1

## 2018-04-03 MED ORDER — LOPERAMIDE HCL 2 MG PO CAPS: 2.0000 mg | ORAL_CAPSULE | Freq: Four times a day (QID) | ORAL | 0 refills | Status: DC | PRN

## 2018-04-03 MED ORDER — METHOCARBAMOL 500 MG PO TABS: 500.0000 mg | ORAL_TABLET | Freq: Three times a day (TID) | ORAL | 0 refills | Status: DC | PRN

## 2018-04-03 MED ORDER — ONDANSETRON 4 MG PO TBDP
4.0000 mg | ORAL_TABLET | Freq: Once | ORAL | Status: AC
  Administered 2018-04-03: 4 mg via ORAL
  Filled 2018-04-03: qty 1

## 2018-04-03 MED ORDER — CLONIDINE HCL 0.1 MG PO TABS
0.1000 mg | ORAL_TABLET | Freq: Once | ORAL | Status: AC
  Administered 2018-04-03: 0.1 mg via ORAL
  Filled 2018-04-03: qty 1

## 2018-04-03 MED ORDER — DICYCLOMINE HCL 20 MG PO TABS: 20.0000 mg | ORAL_TABLET | Freq: Four times a day (QID) | ORAL | 0 refills | Status: DC | PRN

## 2018-04-03 MED ORDER — NAPROXEN 500 MG PO TABS: 500.0000 mg | ORAL_TABLET | Freq: Two times a day (BID) | ORAL | 0 refills | Status: DC | PRN

## 2018-04-03 MED ORDER — HYDROXYZINE HCL 25 MG PO TABS: 25.0000 mg | ORAL_TABLET | Freq: Four times a day (QID) | ORAL | 0 refills | Status: DC | PRN

## 2018-04-03 NOTE — ED Provider Notes (Addendum)
[facility] DEPT Provider Note   CSN: [id number] Arrival date & time: 04/03/18  2122     History   Chief Complaint Chief Complaint  Patient presents with  . Detox    HPI James [name] is a 58 y.o. male.  HPI Patient presents requesting help with detox.  He is using opiates.  Mostly oxycodone.  States he uses about 6 pills of 30 mg Roxie's a day.  Last use yesterday.  States [facility] is [facility] about treatment.  States they have an appointment for him on Tuesday for more treatment but he needs to detox first.  States he does not need medical clearance.  Has chronic abdominal pain and states that is why get hooked on the medicine.  He states he started having increasing pain diarrhea nausea and muscle aches. Past Medical History:  Diagnosis Date  . Arthritis    right knee right wrist   . Chronic kidney disease   . GERD (gastroesophageal reflux disease)   . Hypertension    pt states was treated with medication back in 2000; currently on no medication   . Left renal mass   . Melanoma ([place]) dx'd 66yrs ago age 15   rt lat chest wall--surg only  . Pancreatitis   . Renal cancer ([place]) dx'd 01/2015   left nephrectomy  . Wears glasses     Patient Active Problem List   Diagnosis Date Noted  . Patellar tendon rupture 03/27/2016  . Thrombosis of saphenous vein, left 11/28/2015  . Alcohol induced acute pancreatitis 11/23/2015  . Elevated lipase 11/23/2015  . Pancreatitis 11/23/2015  . Acute pancreatitis 11/23/2015  . Renal mass 02/15/2015  . Renal cell carcinoma ([place]) 02/15/2015  . Cellulitis and abscess of leg, left 07/14/2012  . GERD (gastroesophageal reflux disease) 07/14/2012  . Alcohol dependence ([place]) 05/25/2012    Past Surgical History:  Procedure Laterality Date  . ABDOMINAL SURGERY  2017   remove tumor cyst kidney  . COLONOSCOPY    . CYSTOSCOPY WITH RETROGRADE PYELOGRAM, URETEROSCOPY AND STENT PLACEMENT Left 07/19/2015   Procedure: LEFT RETROGRADE PYELOGRAM, LEFT URETEROSCOPY AND LEFT URETERAL STENT PLACEMENT;  Surgeon: Ardis Hughs, MD;  Location: [facility];  Service: Urology;  Laterality: Left;  . drained cyst      left kidney 10 months ago   . lymph nodes removed      right armpit secondary to melanoma   . NEPHRECTOMY Left 02/15/2015   Procedure: LEFT OPEN PARTIAL NEPHRECTOMY;  Surgeon: Ardis Hughs, MD;  Location: [facility];  Service: Urology;  Laterality: Left;  . PATELLAR TENDON REPAIR Right 03/27/2016   Procedure: RIGHT PATELLA TENDON REPAIR;  Surgeon: Marybelle Killings, MD;  Location: [facility];  Service: Orthopedics;  Laterality: Right;  . right armpit surgery     for melanoma        Home Medications    Prior to Admission medications   Medication Sig Start Date End Date Taking? Authorizing Provider  cloNIDine (CATAPRES) 0.1 MG tablet Take 1 tablet (0.1 mg total) by mouth 4 (four) times daily for 2 days, THEN 1 tablet (0.1 mg total) 2 (two) times daily in the am and at bedtime. for 2 days, THEN 1 tablet (0.1 mg total) every morning for 2 days. 04/03/18 04/09/18  Davonna Belling, MD  dicyclomine (BENTYL) 20 MG tablet Take 1 tablet (20 mg total) by mouth every 6 (six) hours as needed for spasms. 04/03/18   Davonna Belling, MD  Esomeprazole Magnesium (NEXIUM PO) Take 1 tablet by mouth daily.    [provider]  gabapentin (NEURONTIN) 300 MG capsule Take 900 mg by mouth 3 (three) times daily.     [provider]  HYDROcodone-acetaminophen (NORCO/VICODIN) 5-325 MG tablet Take 1 tablet by mouth every 6 (six) hours as needed for moderate pain. Patient not taking: Reported on 07/28/2017 08/09/16   Esterwood, Amy S, PA-C  hydrOXYzine (ATARAX/VISTARIL) 25 MG tablet Take 1 tablet (25 mg total) by mouth every 6 (six) hours as needed. 04/03/18   Davonna Belling, MD  loperamide (IMODIUM) 2 MG capsule Take 1 capsule (2 mg total) by mouth 4 (four) times daily as needed for diarrhea or loose stools.  04/03/18   Davonna Belling, MD  methocarbamol (ROBAXIN) 500 MG tablet Take 1 tablet (500 mg total) by mouth every 8 (eight) hours as needed for muscle spasms. 04/03/18   Davonna Belling, MD  naproxen (NAPROSYN) 500 MG tablet Take 1 tablet (500 mg total) by mouth 2 (two) times daily as needed. 04/03/18   Davonna Belling, MD  naproxen sodium (ALEVE) 220 MG tablet Take 440 mg by mouth 4 (four) times daily as needed (pain).    [provider]  omeprazole (PRILOSEC) 20 MG capsule Take 1 capsule (20 mg total) by mouth 2 (two) times daily before a meal. Patient not taking: Reported on 07/28/2017 08/06/16   Esterwood, Amy S, PA-C  ondansetron (ZOFRAN-ODT) 4 MG disintegrating tablet Take 1 tablet (4 mg total) by mouth every 8 (eight) hours as needed for nausea or vomiting. 04/03/18   Davonna Belling, MD    Family History Family History  Problem Relation Age of Onset  . Lung cancer Mother        non-smoker  . Emphysema Father        smoker  . Colon cancer Neg Hx   . Esophageal cancer Neg Hx   . Rectal cancer Neg Hx   . Stomach cancer Neg Hx     Social History Social History   Tobacco Use  . Smoking status: Current Every Day Smoker    Packs/day: 0.33    Years: 20.00    Pack years: 6.60    Types: Cigarettes  . Smokeless tobacco: Never Used  Substance Use Topics  . Alcohol use: Yes  . Drug use: No     Allergies   Sulfa antibiotics   Review of Systems Review of Systems  Constitutional: Positive for appetite change. Negative for fever.  HENT: Negative for congestion.   Respiratory: Negative for shortness of breath.   Cardiovascular: Negative for chest pain.  Gastrointestinal: Positive for abdominal pain, diarrhea, nausea and vomiting.  Endocrine: Negative for polyuria.  Genitourinary: Negative for flank pain.  Musculoskeletal: Positive for myalgias. Negative for back pain.  Skin: Negative for rash.  Neurological: Negative for weakness.  Psychiatric/Behavioral: Negative  for confusion and suicidal ideas.     Physical Exam Updated Vital Signs BP (!) 161/102 (BP Location: Left Arm)   Pulse 78   Temp 98 F (36.7 C) (Oral)   Resp 16   Physical Exam HENT:     Head: Atraumatic.     Mouth/Throat:     Mouth: Mucous membranes are moist.  Eyes:     Extraocular Movements: Extraocular movements intact.  Neck:     Musculoskeletal: Neck supple.  Cardiovascular:     Rate and Rhythm: Normal rate and regular rhythm.  Abdominal:     Tenderness: There is abdominal tenderness.  Comments: Upper abdominal tenderness.  No rebound or guarding  Musculoskeletal:     Right lower leg: No edema.     Left lower leg: No edema.  Skin:    General: Skin is warm.     Capillary Refill: Capillary refill takes less than 2 seconds.  Neurological:     Mental Status: He is alert.      ED Treatments / Results  Labs (all labs ordered are listed, but only abnormal results are displayed) Labs Reviewed - No data to display  EKG None  Radiology No results found.  Procedures Procedures (including critical care time)  Medications Ordered in ED Medications  cloNIDine (CATAPRES) tablet 0.1 mg (0.1 mg Oral Given 04/03/18 2304)  ondansetron (ZOFRAN-ODT) disintegrating tablet 4 mg (4 mg Oral Given 04/03/18 2304)  dicyclomine (BENTYL) capsule 10 mg (10 mg Oral Given 04/03/18 2304)     Initial Impression / Assessment and Plan / ED Course  I have reviewed the triage vital signs and the nursing notes.  Pertinent labs & imaging results that were available during my care of the patient were reviewed by me and considered in my medical decision making (see chart for details).     Patient with opiate use and not withdrawal.  Reviewed previous records.  Do not think he needs more medical clearance at this time and already has arranged follow-up.  He does however need help with detox.  He was given the medicines for a clonidine detox protocol.  Patient will be discharged  home.  Final Clinical Impressions(s) / ED Diagnoses   Final diagnoses:  Opiate abuse, continuous ([place])  Opiate withdrawal [facility])    ED Discharge Orders         Ordered    cloNIDine (CATAPRES) 0.1 MG tablet     04/03/18 2245    dicyclomine (BENTYL) 20 MG tablet  Every 6 hours PRN     04/03/18 2245    hydrOXYzine (ATARAX/VISTARIL) 25 MG tablet  Every 6 hours PRN     04/03/18 2245    loperamide (IMODIUM) 2 MG capsule  4 times daily PRN     04/03/18 2245    methocarbamol (ROBAXIN) 500 MG tablet  Every 8 hours PRN     04/03/18 2245    naproxen (NAPROSYN) 500 MG tablet  2 times daily PRN     04/03/18 2245    ondansetron (ZOFRAN-ODT) 4 MG disintegrating tablet  Every 8 hours PRN     04/03/18 2245           Davonna Belling, MD 04/03/18 2348    Davonna Belling, MD 04/04/18 0004

## 2018-04-03 NOTE — Discharge Instructions (Signed)
Go to [facility] as planned to help with your sobriety.

## 2018-04-03 NOTE — ED Triage Notes (Addendum)
Pt reports that he needs detox from roxicodone. He states that he went to [facility] and filled out all the paperwork, but they said that they couldn't take him until he was detoxed. Endorses body aches, nausea, and vomiting. Denies SI/HI.

## 2018-04-03 NOTE — ED Notes (Signed)
Bed: [facility] Expected date:  Expected time:  Means of arrival:  Comments: 

## 2018-04-30 MED FILL — IBUPROFEN 800 MG TAB: 800 | 6 days supply | Qty: 20 | Fill #0

## 2019-03-23 IMAGING — CT CT ABD-PELV W/ CM
2 of 5 series · 16 of 46 positions shown, 18 images · IV contrast (iopamidol)
Comparison: Abdominopelvic CT 11/29/2015.  Ultrasound 08/09/2016.

CLINICAL DATA: Left upper quadrant abdominal pain for 3 weeks.
History of pancreatitis and left renal cell carcinoma.

EXAM:
CT ABDOMEN AND PELVIS WITH CONTRAST
TECHNIQUE: Multidetector CT imaging of the abdomen and pelvis was performed
using the standard protocol following bolus administration of
intravenous contrast.
CONTRAST:  100mL 2ID8O7-FKK IOPAMIDOL (2ID8O7-FKK) INJECTION 61%

[Series 3: abdroutine 5.0 i31s 1 · axial · 0.71mm/px · z∈[-458,-48]mm · 13 of 94 slices shown, 15 images]
[im 6/94  soft-tissue]
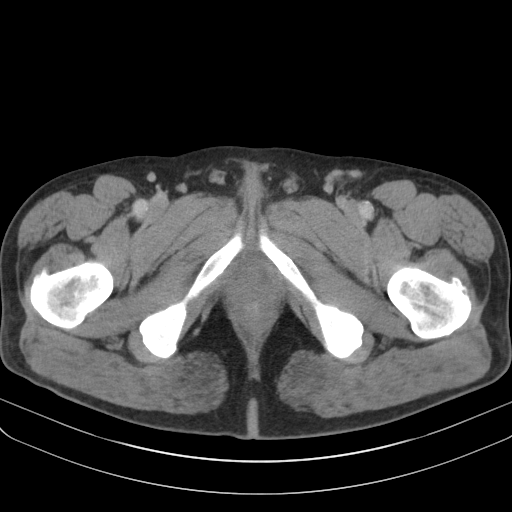
[im 6/94  bone]
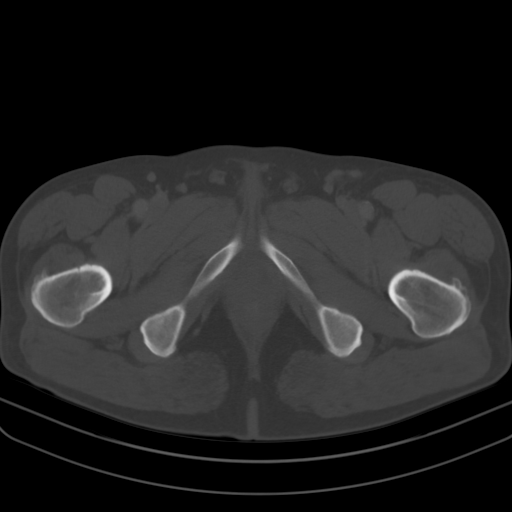
[im 11/94  soft-tissue]
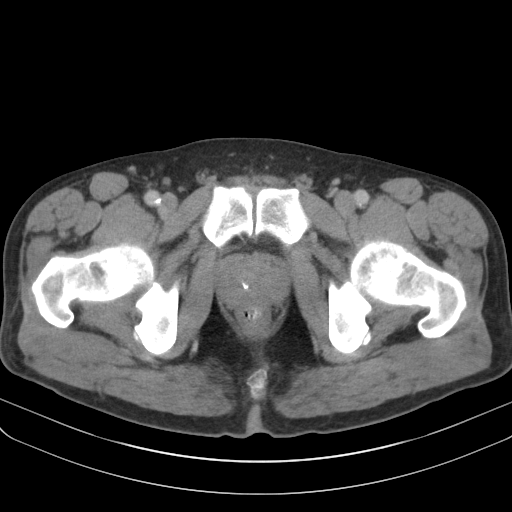
[im 21/94  soft-tissue]
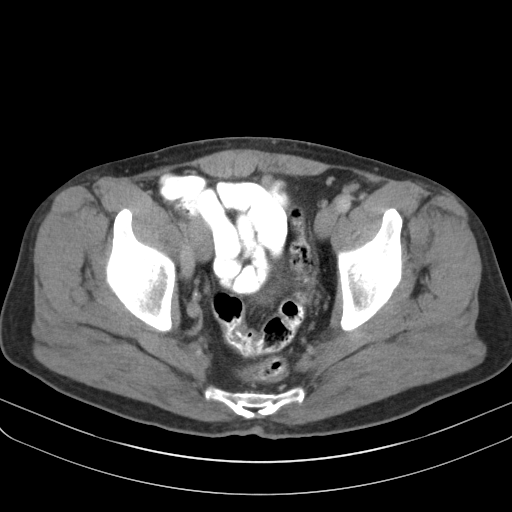
[im 26/94  soft-tissue]
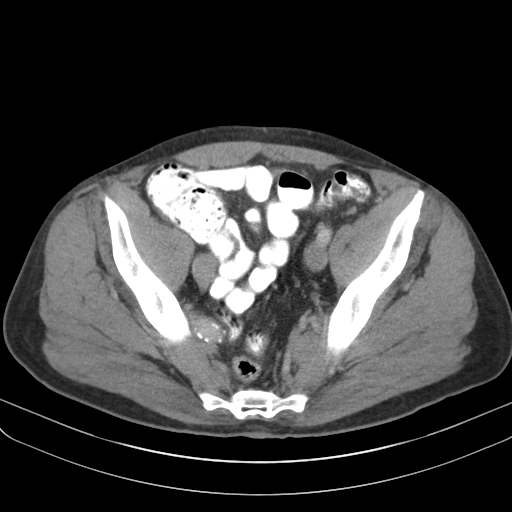
[im 32/94  soft-tissue]
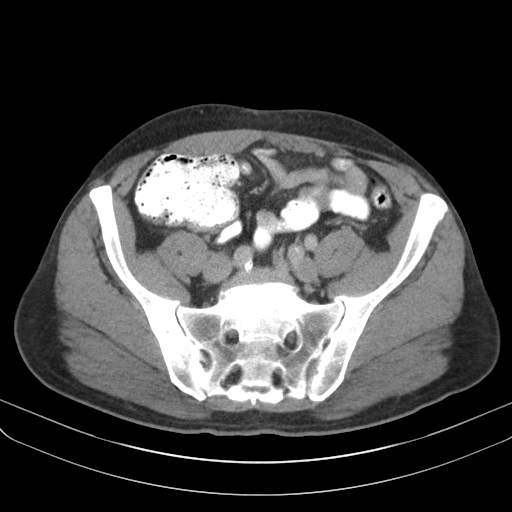
[im 42/94  soft-tissue]
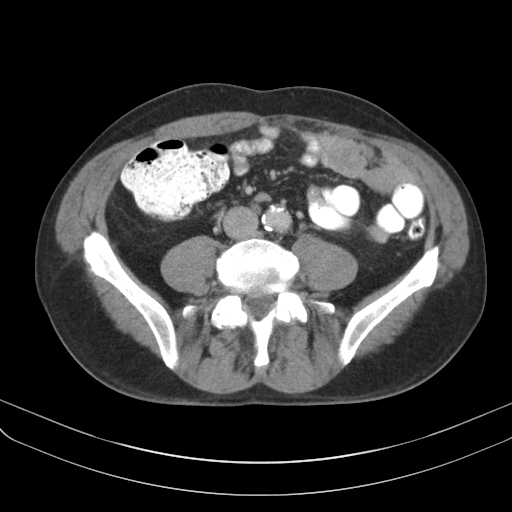
[im 47/94  soft-tissue]
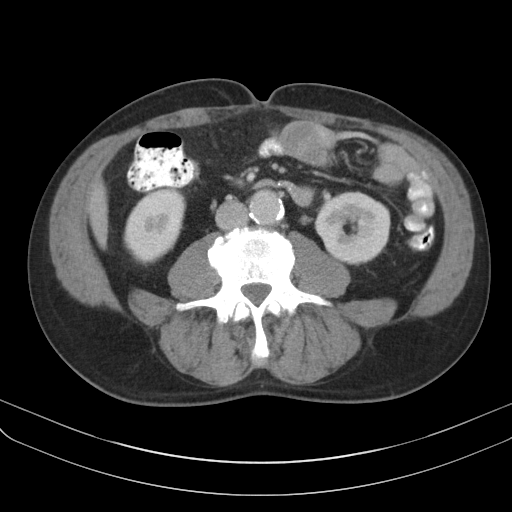
[im 52/94  soft-tissue]
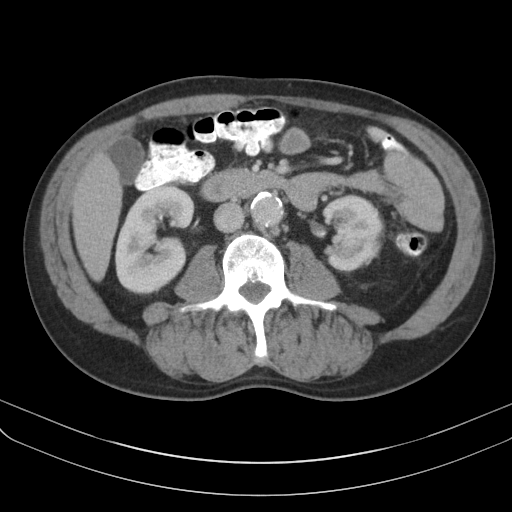
[im 63/94  soft-tissue]
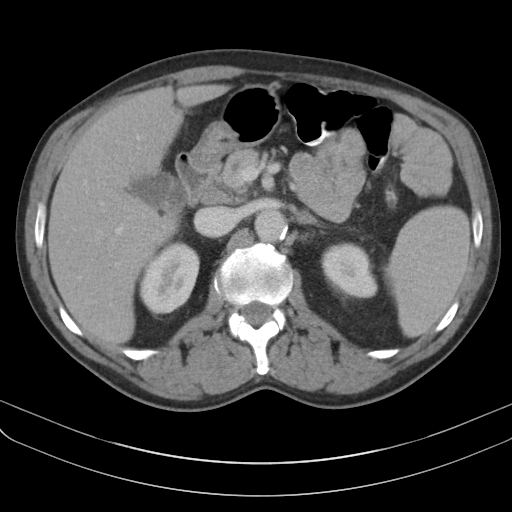
[im 63/94  bone]
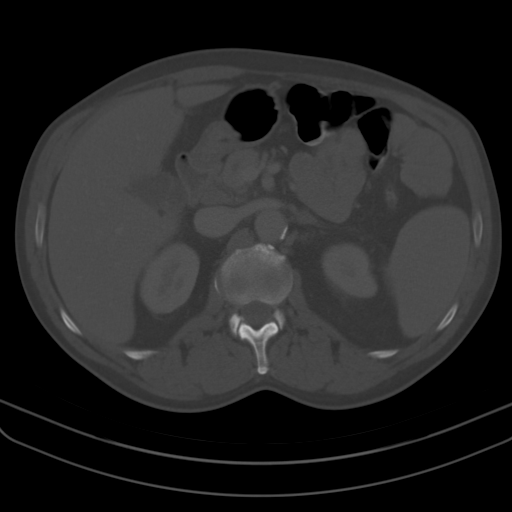
[im 68/94  soft-tissue]
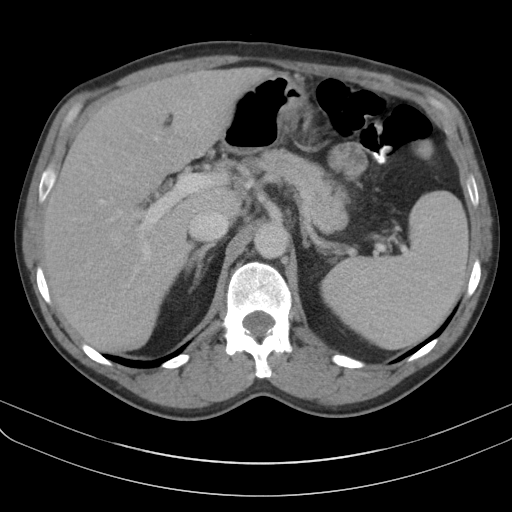
[im 73/94  soft-tissue]
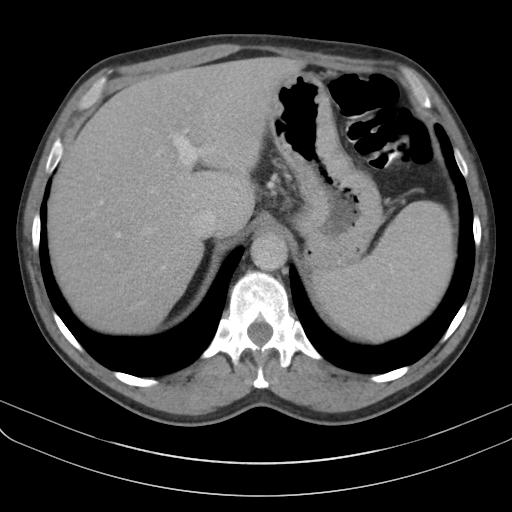
[im 83/94  soft-tissue]
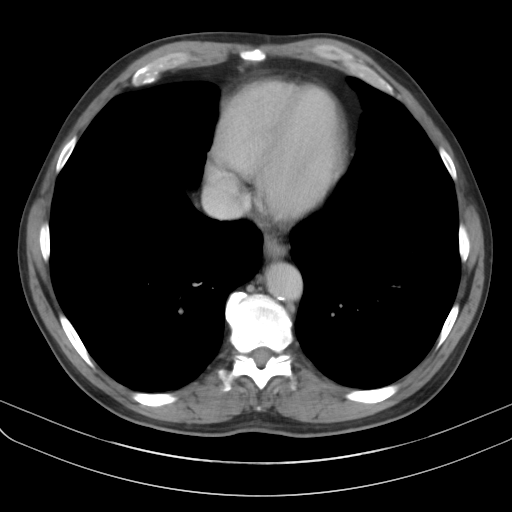
[im 88/94  soft-tissue]
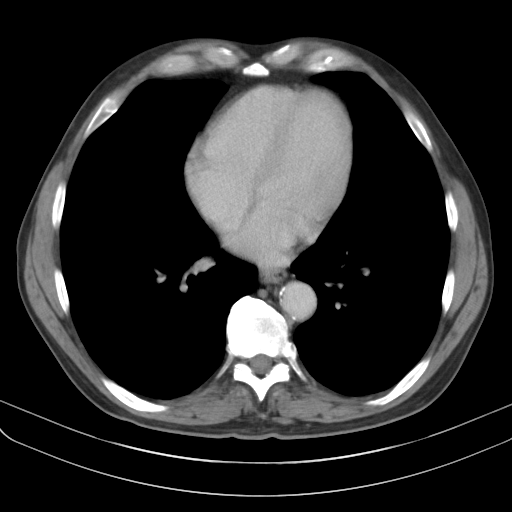

[Series 6: abdroutine 2.0 mpr cor · coronal · 0.74mm/px · 3 of 137 slices shown]
[im 46/137  soft-tissue]
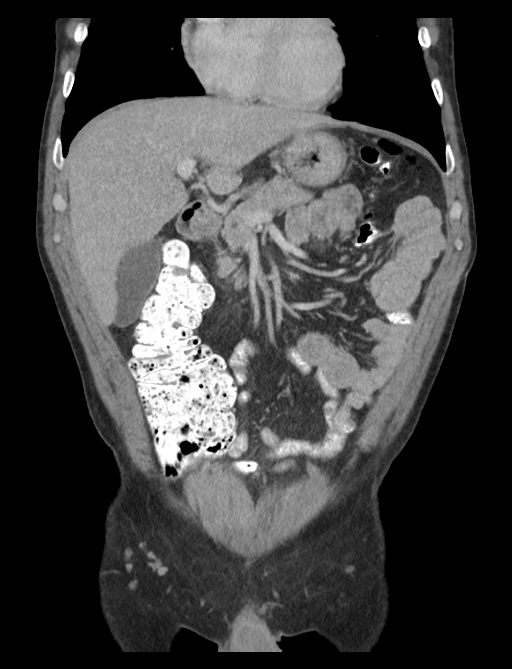
[im 61/137  soft-tissue]
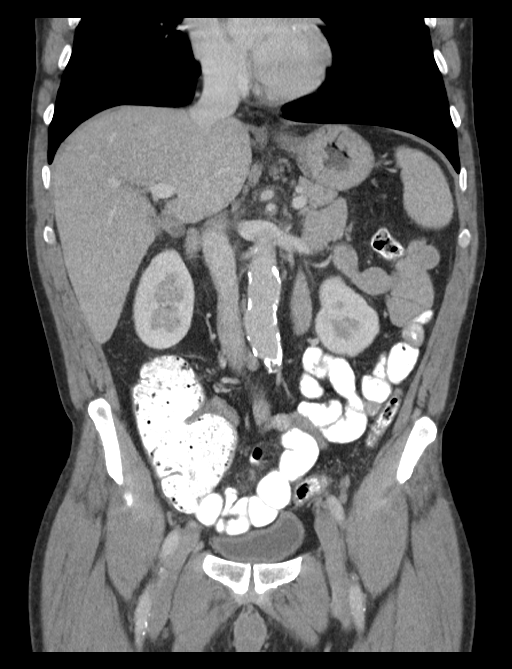
[im 76/137  soft-tissue]
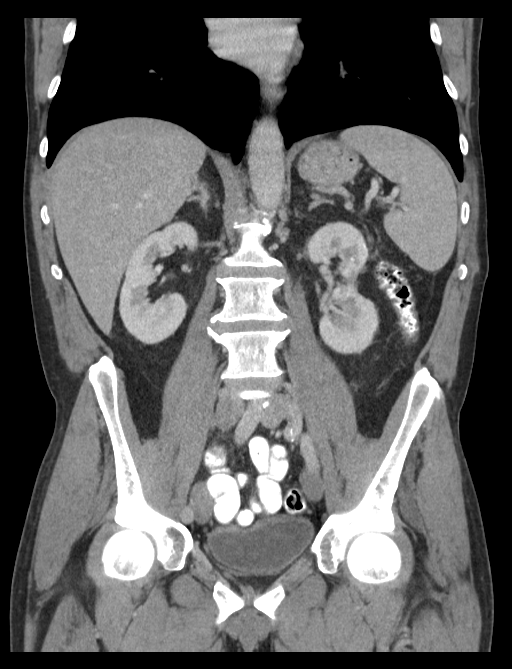

[16 of 46 positions shown; findings below may reference images not displayed]

FINDINGS: Lower chest: Mild subpleural scarring in the right lower lobe,
improved from previous study. No suspicious nodule, pleural or
pericardial effusion.

Hepatobiliary: The liver is normal in density without focal
abnormality. No evidence of gallstones, gallbladder wall thickening
or biliary dilatation.

Pancreas: Unremarkable. No pancreatic ductal dilatation or
surrounding inflammatory changes.

Spleen: Within normal limits for size.  No focal abnormality.

Adrenals/Urinary Tract: Both adrenal glands appear normal. Stable
postsurgical changes in the interpolar region of the left kidney
status post partial nephrectomy. No evidence of renal mass,
hydronephrosis or urinary tract calculus. The bladder appears
unremarkable.

Stomach/Bowel: No evidence of bowel wall thickening, distention or
surrounding inflammatory change.

Vascular/Lymphatic: There are no enlarged abdominal or pelvic lymph
nodes. Stable small lymph nodes in the gastrohepatic ligament and
retroperitoneum. There is aortic and branch vessel atherosclerosis
without aneurysm or large vessel occlusion.

Reproductive: The prostate gland and seminal vesicles appear
unchanged.

Other: No evidence of abdominal wall mass or hernia. No ascites.

Musculoskeletal: No acute or significant osseous findings.
IMPRESSION: 1. No acute findings or explanation for the patient's symptoms. No
evidence of complicated pancreatitis.
2. Stable postsurgical changes in the left kidney. No evidence of
renal mass or metastatic disease.
3.  Aortic Atherosclerosis ([id number]).

## 2020-06-13 ENCOUNTER — Emergency Department (HOSPITAL_COMMUNITY)
Admission: EM | Admit: 2020-06-13 | Discharge: 2020-06-13 | Disposition: A | Discharge status: Home / Self Care | Payer: Self-pay | Attending: Emergency Medicine | Admitting: Emergency Medicine

## 2020-06-13 ENCOUNTER — Emergency Department (HOSPITAL_COMMUNITY): Payer: Self-pay

## 2020-06-13 ENCOUNTER — Other Ambulatory Visit (HOSPITAL_COMMUNITY): Payer: Self-pay

## 2020-06-13 ENCOUNTER — Other Ambulatory Visit: Payer: Self-pay

## 2020-06-13 DIAGNOSIS — R202 Paresthesia of skin: Secondary | ICD-10-CM | POA: Insufficient documentation

## 2020-06-13 DIAGNOSIS — N189 Chronic kidney disease, unspecified: Secondary | ICD-10-CM | POA: Insufficient documentation

## 2020-06-13 DIAGNOSIS — S99922A Unspecified injury of left foot, initial encounter: Secondary | ICD-10-CM | POA: Insufficient documentation

## 2020-06-13 DIAGNOSIS — F17210 Nicotine dependence, cigarettes, uncomplicated: Secondary | ICD-10-CM | POA: Insufficient documentation

## 2020-06-13 DIAGNOSIS — L03116 Cellulitis of left lower limb: Secondary | ICD-10-CM | POA: Insufficient documentation

## 2020-06-13 DIAGNOSIS — I129 Hypertensive chronic kidney disease with stage 1 through stage 4 chronic kidney disease, or unspecified chronic kidney disease: Secondary | ICD-10-CM | POA: Insufficient documentation

## 2020-06-13 DIAGNOSIS — Z85528 Personal history of other malignant neoplasm of kidney: Secondary | ICD-10-CM | POA: Insufficient documentation

## 2020-06-13 DIAGNOSIS — Y92009 Unspecified place in unspecified non-institutional (private) residence as the place of occurrence of the external cause: Secondary | ICD-10-CM | POA: Insufficient documentation

## 2020-06-13 DIAGNOSIS — W450XXA Nail entering through skin, initial encounter: Secondary | ICD-10-CM | POA: Insufficient documentation

## 2020-06-13 MED ORDER — STERILE WATER FOR INJECTION IJ SOLN
INTRAMUSCULAR | Status: AC
  Administered 2020-06-13: 2.1 mL
  Filled 2020-06-13: qty 10

## 2020-06-13 MED ORDER — DOXYCYCLINE HYCLATE 100 MG PO CAPS
100.0000 mg | ORAL_CAPSULE | Freq: Two times a day (BID) | ORAL | 0 refills | Status: DC
  Filled 2020-06-13: qty 20, 10d supply, fill #0

## 2020-06-13 MED ORDER — CEFTRIAXONE SODIUM 1 G IJ SOLR
1.0000 g | Freq: Once | INTRAMUSCULAR | Status: AC
  Administered 2020-06-13: 1 g via INTRAMUSCULAR
  Filled 2020-06-13: qty 10

## 2020-06-13 MED ORDER — CEFTRIAXONE SODIUM 1 G IJ SOLR
1.0000 g | Freq: Once | INTRAMUSCULAR | 0 refills | Status: AC
  Filled 2020-06-13: qty 1, 1d supply, fill #0

## 2020-06-13 NOTE — ED Provider Notes (Signed)
[facility] DEPT Provider Note   CSN: [id number] Arrival date & time: 06/13/20  0747     History Chief Complaint  Patient presents with  . Foot Injury  . Hand Numbness    James [name] is a 60 y.o. male.  Pt reports he stepped on a nail 4 days ago.  Pt reports the top of his foot is red and swollen.   The history is provided by the patient. No language interpreter was used.  Foot Injury Location:  Foot Time since incident:  4 days Injury: yes   Foot location:  L foot Pain details:    Quality:  Aching   Radiates to:  Does not radiate   Severity:  Moderate   Onset quality:  Gradual   Timing:  Constant Chronicity:  New Foreign body present:  No foreign bodies Tetanus status:  Up to date    Pt also reports numbness and tingling of both hands for 2 days   Past Medical History:  Diagnosis Date  . Arthritis    right knee right wrist   . Chronic kidney disease   . GERD (gastroesophageal reflux disease)   . Hypertension    pt states was treated with medication back in 2000; currently on no medication   . Left renal mass   . Melanoma ([place]) dx'd 12yrs ago age 80   rt lat chest wall--surg only  . Pancreatitis   . Renal cancer ([place]) dx'd 01/2015   left nephrectomy  . Wears glasses     Patient Active Problem List   Diagnosis Date Noted  . Patellar tendon rupture 03/27/2016  . Thrombosis of saphenous vein, left 11/28/2015  . Alcohol induced acute pancreatitis 11/23/2015  . Elevated lipase 11/23/2015  . Pancreatitis 11/23/2015  . Acute pancreatitis 11/23/2015  . Renal mass 02/15/2015  . Renal cell carcinoma ([place]) 02/15/2015  . Cellulitis and abscess of leg, left 07/14/2012  . GERD (gastroesophageal reflux disease) 07/14/2012  . Alcohol dependence ([place]) 05/25/2012    Past Surgical History:  Procedure Laterality Date  . ABDOMINAL SURGERY  2017   remove tumor cyst kidney  . COLONOSCOPY    . CYSTOSCOPY WITH RETROGRADE PYELOGRAM,  URETEROSCOPY AND STENT PLACEMENT Left 07/19/2015   Procedure: LEFT RETROGRADE PYELOGRAM, LEFT URETEROSCOPY AND LEFT URETERAL STENT PLACEMENT;  Surgeon: Ardis Hughs, MD;  Location: [facility];  Service: Urology;  Laterality: Left;  . drained cyst      left kidney 10 months ago   . lymph nodes removed      right armpit secondary to melanoma   . NEPHRECTOMY Left 02/15/2015   Procedure: LEFT OPEN PARTIAL NEPHRECTOMY;  Surgeon: Ardis Hughs, MD;  [facility];  Service: Urology;  Laterality: Left;  . PATELLAR TENDON REPAIR Right 03/27/2016   Procedure: RIGHT PATELLA TENDON REPAIR;  Surgeon: Marybelle Killings, MD;  Location: [facility];  Service: Orthopedics;  Laterality: Right;  . right armpit surgery     for melanoma       Family History  Problem Relation Age of Onset  . Lung cancer Mother        non-smoker  . Emphysema Father        smoker  . Colon cancer Neg Hx   . Esophageal cancer Neg Hx   . Rectal cancer Neg Hx   . Stomach cancer Neg Hx     Social History   Tobacco Use  . Smoking status: Current Every Day Smoker  Packs/day: 0.33    Years: 20.00    Pack years: 6.60    Types: Cigarettes  . Smokeless tobacco: Never Used  Vaping Use  . Vaping Use: Never used  Substance Use Topics  . Alcohol use: Yes  . Drug use: No    Home Medications Prior to Admission medications   Medication Sig Start Date End Date Taking? Authorizing Provider  cefTRIAXone (ROCEPHIN) 1 g injection Inject 1 g into the muscle once for 1 dose. 06/13/20 06/13/20 Yes Fransico Meadow, PA-C  doxycycline (VIBRAMYCIN) 100 MG capsule Take 1 capsule (100 mg total) by mouth 2 (two) times daily. 06/13/20  Yes Caryl Ada K, PA-C  cloNIDine (CATAPRES) 0.1 MG tablet Take 1 tablet (0.1 mg total) by mouth 4 (four) times daily for 2 days, THEN 1 tablet (0.1 mg total) 2 (two) times daily in the am and at bedtime. for 2 days, THEN 1 tablet (0.1 mg total) every morning for 2 days. 04/03/18 04/09/18  Davonna Belling, MD   dicyclomine (BENTYL) 20 MG tablet Take 1 tablet (20 mg total) by mouth every 6 (six) hours as needed for spasms. 04/03/18   Davonna Belling, MD  Esomeprazole Magnesium (NEXIUM PO) Take 1 tablet by mouth daily.    [provider]  gabapentin (NEURONTIN) 300 MG capsule Take 900 mg by mouth 3 (three) times daily.     [provider]  HYDROcodone-acetaminophen (NORCO/VICODIN) 5-325 MG tablet Take 1 tablet by mouth every 6 (six) hours as needed for moderate pain. Patient not taking: Reported on 07/28/2017 08/09/16   Esterwood, Amy S, PA-C  hydrOXYzine (ATARAX/VISTARIL) 25 MG tablet Take 1 tablet (25 mg total) by mouth every 6 (six) hours as needed. 04/03/18   Davonna Belling, MD  loperamide (IMODIUM) 2 MG capsule Take 1 capsule (2 mg total) by mouth 4 (four) times daily as needed for diarrhea or loose stools. 04/03/18   Davonna Belling, MD  methocarbamol (ROBAXIN) 500 MG tablet Take 1 tablet (500 mg total) by mouth every 8 (eight) hours as needed for muscle spasms. 04/03/18   Davonna Belling, MD  naproxen (NAPROSYN) 500 MG tablet Take 1 tablet (500 mg total) by mouth 2 (two) times daily as needed. 04/03/18   Davonna Belling, MD  naproxen sodium (ALEVE) 220 MG tablet Take 440 mg by mouth 4 (four) times daily as needed (pain).    [provider]  omeprazole (PRILOSEC) 20 MG capsule Take 1 capsule (20 mg total) by mouth 2 (two) times daily before a meal. Patient not taking: Reported on 07/28/2017 08/06/16   Esterwood, Amy S, PA-C  ondansetron (ZOFRAN-ODT) 4 MG disintegrating tablet Take 1 tablet (4 mg total) by mouth every 8 (eight) hours as needed for nausea or vomiting. 04/03/18   Davonna Belling, MD    Allergies    Sulfa antibiotics  Review of Systems   Review of Systems  All other systems reviewed and are negative.   Physical Exam Updated Vital Signs BP (!) 159/98 (BP Location: Right Arm)   Pulse 93   Temp 97.6 F (36.4 C) (Oral)   Resp 18   Ht 6' (1.829 m)   Wt 86.2  kg   SpO2 100%   BMI 25.77 kg/m   Physical Exam Vitals and nursing note reviewed.  Cardiovascular:     Rate and Rhythm: Normal rate.     Pulses: Normal pulses.  Pulmonary:     Effort: Pulmonary effort is normal.  Musculoskeletal:        General:  Swelling and tenderness present.     Comments: Swollen tender left foot  Dorsal foot,  No swelling bottom of foot, no redness at puncture area  nv and ns intact   Bilat hands normal appearance, normal pulses   Skin:    General: Skin is warm.  Neurological:     General: No focal deficit present.     Mental Status: He is alert.  Psychiatric:        Mood and Affect: Mood normal.     ED Results / Procedures / Treatments   Labs (all labs ordered are listed, but only abnormal results are displayed) Labs Reviewed - No data to display  EKG None  Radiology DG Foot Complete Left  Result Date: 06/13/2020 CLINICAL DATA:  Stepped on nail. EXAM: LEFT FOOT - COMPLETE 3+ VIEW COMPARISON:  November 18, 2014. FINDINGS: There is no evidence of fracture or dislocation. Mild degenerative changes seen involving the first metatarsophalangeal joint. Mild posterior calcaneal spurring is noted. No radiopaque foreign body is noted. Soft tissues are unremarkable. IMPRESSION: No acute abnormality is noted. Electronically Signed   By: Marijo Conception M.D.   On: 06/13/2020 08:48    Procedures Procedures   Medications Ordered in ED Medications  cefTRIAXone (ROCEPHIN) injection 1 g (has no administration in time range)  sterile water (preservative free) injection (has no administration in time range)    ED Course  I have reviewed the triage vital signs and the nursing notes.  Pertinent labs & imaging results that were available during my care of the patient were reviewed by me and considered in my medical decision making (see chart for details).    MDM Rules/Calculators/A&P                          MDM:   Xray no foreign body, Pt given Rocephin !  Gram.  Pt given rx for doxycyline Pt advised to recheck at Urgent care in 2 days  Hands  Pt advised to follow up with primary for arthritis and hand pain Final Clinical Impression(s) / ED Diagnoses Final diagnoses:  Cellulitis of foot, left    Rx / DC Orders ED Discharge Orders         Ordered    cefTRIAXone (ROCEPHIN) 1 g injection   Once        06/13/20 0935    doxycycline (VIBRAMYCIN) 100 MG capsule  2 times daily        06/13/20 1004        An After Visit Summary was printed and given to the patient. An After Visit Summary was printed and given to the patient.    Fransico Meadow, PA-C 06/13/20 1031    Arnaldo Natal, MD 06/13/20 [phone]

## 2020-06-13 NOTE — Discharge Instructions (Addendum)
Recheck at Urgent care in 2 days.  Schedule to see a primary care MD for recheck

## 2020-06-13 NOTE — ED Triage Notes (Signed)
Pt came from home via POV. Pt complains of left foot pain d/t stepping on nail on 4/15. Pt also reports numbness and tingling in both hands in the morning since 4/17 that takes several hours to resolve daily. Last tetanus shot was 5-6 years ago.

## 2020-08-01 ENCOUNTER — Other Ambulatory Visit (HOSPITAL_BASED_OUTPATIENT_CLINIC_OR_DEPARTMENT_OTHER): Payer: Self-pay

## 2021-11-20 ENCOUNTER — Other Ambulatory Visit: Payer: Self-pay

## 2022-07-27 ENCOUNTER — Emergency Department (HOSPITAL_BASED_OUTPATIENT_CLINIC_OR_DEPARTMENT_OTHER)
Admission: EM | Admit: 2022-07-27 | Discharge: 2022-07-27 | Disposition: A | Discharge status: Home / Self Care | Payer: Self-pay | Attending: Emergency Medicine | Admitting: Emergency Medicine

## 2022-07-27 ENCOUNTER — Emergency Department (HOSPITAL_BASED_OUTPATIENT_CLINIC_OR_DEPARTMENT_OTHER): Payer: Self-pay | Admitting: Radiology

## 2022-07-27 ENCOUNTER — Emergency Department (HOSPITAL_BASED_OUTPATIENT_CLINIC_OR_DEPARTMENT_OTHER): Payer: Self-pay

## 2022-07-27 ENCOUNTER — Encounter (HOSPITAL_BASED_OUTPATIENT_CLINIC_OR_DEPARTMENT_OTHER): Payer: Self-pay | Admitting: Emergency Medicine

## 2022-07-27 DIAGNOSIS — M25512 Pain in left shoulder: Secondary | ICD-10-CM | POA: Insufficient documentation

## 2022-07-27 DIAGNOSIS — Z85820 Personal history of malignant melanoma of skin: Secondary | ICD-10-CM | POA: Insufficient documentation

## 2022-07-27 DIAGNOSIS — W19XXXA Unspecified fall, initial encounter: Secondary | ICD-10-CM | POA: Insufficient documentation

## 2022-07-27 DIAGNOSIS — S63283A Dislocation of proximal interphalangeal joint of left middle finger, initial encounter: Secondary | ICD-10-CM | POA: Insufficient documentation

## 2022-07-27 DIAGNOSIS — M25561 Pain in right knee: Secondary | ICD-10-CM | POA: Insufficient documentation

## 2022-07-27 DIAGNOSIS — Z85528 Personal history of other malignant neoplasm of kidney: Secondary | ICD-10-CM | POA: Insufficient documentation

## 2022-07-27 DIAGNOSIS — N189 Chronic kidney disease, unspecified: Secondary | ICD-10-CM | POA: Insufficient documentation

## 2022-07-27 DIAGNOSIS — S63259A Unspecified dislocation of unspecified finger, initial encounter: Secondary | ICD-10-CM

## 2022-07-27 MED ORDER — BUPIVACAINE HCL (PF) 0.5 % IJ SOLN
10.0000 mL | Freq: Once | INTRAMUSCULAR | Status: AC
  Administered 2022-07-27: 10 mL
  Filled 2022-07-27: qty 10

## 2022-07-27 NOTE — Discharge Instructions (Addendum)
The workup today was overall reassuring.  Your finger was relocated and placed in the splint.  Please keep splint on until follow-up with hand specialist in the outpatient setting.  Recommend treatment of pain at home with Tylenol/Motrin.  Please do not hesitate to return to emergency department for worrisome signs and symptoms we discussed become apparent.  You certainly may have small fracture fragments from dislocating your finger.  Please follow-up with a hand surgeon  (you were given the information for one of our hand surgeons to followup with).

## 2022-07-27 NOTE — ED Provider Notes (Signed)
[place] EMERGENCY DEPARTMENT AT [facility] Provider Note   CSN: [id number] Arrival date & time: 07/27/22  1748     History {Add pertinent medical, surgical, social history, OB history to HPI:1} Chief Complaint  Patient presents with   James [name] is a 62 y.o. male.   Fall   62 year old male presents emergency department after fall.  Patient states that he was wearing his new boots and tied his shoes strings too long and managed to catch the shoestrings on his left foot causing him to fall on his left side.  Patient states that he braced his fall with his left upper extremity.  Currently complaining of left middle finger pain, left shoulder pain, right knee pain.  Reports there being a plastic bucket on the ground next to him that he hit his head on with no subsequent trauma to the cement.  Denies loss of consciousness, blood thinner use, visual symptoms, gait abnormality, slurred speech, facial droop, weakness or sensory deficits in upper/lower extremities.  Denies chest pain, shortness of breath, abdominal pain, nausea, vomiting.  States that he noticed deformity to his left middle finger and has been unable to flex it.  Past medical history significant for melanoma, GERD, renal cell carcinoma, pancreatitis, CKD, alcohol dependence  Home Medications Prior to Admission medications   Medication Sig Start Date End Date Taking? Authorizing Provider  cloNIDine (CATAPRES) 0.1 MG tablet Take 1 tablet (0.1 mg total) by mouth 4 (four) times daily for 2 days, THEN 1 tablet (0.1 mg total) 2 (two) times daily in the am and at bedtime. for 2 days, THEN 1 tablet (0.1 mg total) every morning for 2 days. 04/03/18 04/09/18  Benjiman Core, MD  dicyclomine (BENTYL) 20 MG tablet Take 1 tablet (20 mg total) by mouth every 6 (six) hours as needed for spasms. 04/03/18   Benjiman Core, MD  doxycycline (VIBRAMYCIN) 100 MG capsule Take 1 capsule (100 mg total) by mouth 2 (two) times  daily. 06/13/20   Elson Areas, PA-C  Esomeprazole Magnesium (NEXIUM PO) Take 1 tablet by mouth daily.    [provider]  gabapentin (NEURONTIN) 300 MG capsule Take 900 mg by mouth 3 (three) times daily.     [provider]  HYDROcodone-acetaminophen (NORCO/VICODIN) 5-325 MG tablet Take 1 tablet by mouth every 6 (six) hours as needed for moderate pain. Patient not taking: Reported on 07/28/2017 08/09/16   Esterwood, Amy S, PA-C  hydrOXYzine (ATARAX/VISTARIL) 25 MG tablet Take 1 tablet (25 mg total) by mouth every 6 (six) hours as needed. 04/03/18   Benjiman Core, MD  loperamide (IMODIUM) 2 MG capsule Take 1 capsule (2 mg total) by mouth 4 (four) times daily as needed for diarrhea or loose stools. 04/03/18   Benjiman Core, MD  methocarbamol (ROBAXIN) 500 MG tablet Take 1 tablet (500 mg total) by mouth every 8 (eight) hours as needed for muscle spasms. 04/03/18   Benjiman Core, MD  naproxen (NAPROSYN) 500 MG tablet Take 1 tablet (500 mg total) by mouth 2 (two) times daily as needed. 04/03/18   Benjiman Core, MD  naproxen sodium (ALEVE) 220 MG tablet Take 440 mg by mouth 4 (four) times daily as needed (pain).    [provider]  omeprazole (PRILOSEC) 20 MG capsule Take 1 capsule (20 mg total) by mouth 2 (two) times daily before a meal. Patient not taking: Reported on 07/28/2017 08/06/16   Esterwood, Amy S, PA-C  ondansetron (ZOFRAN-ODT) 4 MG disintegrating  tablet Take 1 tablet (4 mg total) by mouth every 8 (eight) hours as needed for nausea or vomiting. 04/03/18   Benjiman Core, MD      Allergies    Sulfa antibiotics    Review of Systems   Review of Systems  All other systems reviewed and are negative.   Physical Exam Updated Vital Signs BP (!) 158/101   Pulse 90   Temp 98.2 F (36.8 C) (Oral)   Resp 18   SpO2 99%  Physical Exam Vitals and nursing note reviewed.  Constitutional:      General: He is not in acute distress.    Appearance: He is  well-developed.  HENT:     Head: Normocephalic and atraumatic.     Right Ear: Tympanic membrane normal.     Left Ear: Tympanic membrane normal.  Eyes:     Conjunctiva/sclera: Conjunctivae normal.  Cardiovascular:     Rate and Rhythm: Normal rate and regular rhythm.     Heart sounds: No murmur heard. Pulmonary:     Effort: Pulmonary effort is normal. No respiratory distress.     Breath sounds: Normal breath sounds.  Abdominal:     Palpations: Abdomen is soft.     Tenderness: There is no abdominal tenderness.  Musculoskeletal:        General: No swelling.     Cervical back: Neck supple.     Comments: No midline tenderness of cervical, thoracic, lumbar spine with no obvious step-off or deformity noted.  No chest wall tenderness to palpation.  Patient with tenderness to palpation of distal clavicle as well as left proximal humerus.  Patient with deformity noted to left third digit with dorsal deformity of distal digit at proximal interphalangeal joint.  Patient unable to flex the digit.  Otherwise, bilateral upper extremities without tenderness to palpation or appreciable deformity.  Radial pulses 2+ bilaterally.  Patient with superficial abrasion noted over left knee and right knee anteriorly.  No bony tenderness to palpation of left knee but with mild tenderness over tibial tuberosity of right knee.  Otherwise, bilateral lower extremities without tenderness to palpation.  Patient with full range of motion of bilateral elbows, shoulders, wrists, hips, knees, ankles, digits.  Skin:    General: Skin is warm and dry.     Capillary Refill: Capillary refill takes less than 2 seconds.  Neurological:     Mental Status: He is alert.     Comments: Alert and oriented to self, place, time and event.   Speech is fluent, clear without dysarthria or dysphasia.   Strength 5/5 in upper/lower extremities   Sensation intact in upper/lower extremities   Normal gait.  CN I not tested  CN II not tested   CN III, IV, VI PERRLA and EOMs intact bilaterally  CN V Intact sensation to sharp and light touch to the face  CN VII facial movements symmetric  CN VIII not tested  CN IX, X no uvula deviation, symmetric rise of soft palate  CN XI 5/5 SCM and trapezius strength bilaterally  CN XII Midline tongue protrusion, symmetric L/R movements     Psychiatric:        Mood and Affect: Mood normal.     ED Results / Procedures / Treatments   Labs (all labs ordered are listed, but only abnormal results are displayed) Labs Reviewed - No data to display  EKG None  Radiology DG Knee Complete 4 Views Right  Result Date: 07/27/2022 CLINICAL DATA:  Trauma, fall, pain  EXAM: RIGHT KNEE - COMPLETE 4+ VIEW COMPARISON:  05/01/2016 FINDINGS: No recent fracture or dislocation is seen. There is no significant effusion. There is increased AP thickness of infrapatellar tendon. There are few linear coarse calcifications in the infrapatellar tendon, possibly calcific tendinosis. IMPRESSION: No recent fracture or dislocation is seen in right knee. Thickening and calcifications in infrapatellar tendon suggest possible calcific tendinosis. Electronically Signed   By: Ernie Avena M.D.   On: 07/27/2022 18:59   DG Hand Complete Left  Result Date: 07/27/2022 CLINICAL DATA:  Trauma, fall EXAM: LEFT HAND - COMPLETE 3+ VIEW COMPARISON:  Left wrist done on 12/25/2003 FINDINGS: There is dorsal dislocation in PIP joint of the middle finger. No definite fracture lines are seen. Degenerative changes are noted with bony spurs in multiple interphalangeal joints and metacarpophalangeal joints. Degenerative changes are noted in radiocarpal joint. There are few radiolucencies in distal radius, possibly subcortical cysts related to degenerative arthritis. Degenerative changes in the left wrist appear to be new in comparison with the study of 12/25/2003. IMPRESSION: There is dorsal dislocation in PIP joint of left middle finger. No  definite recent fractures are noted. Degenerative changes are noted in multiple interphalangeal and metacarpophalangeal joints. 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 Electronically Signed   By: Ernie Avena M.D.   On: 07/27/2022 18:57   DG Shoulder Left  Result Date: 07/27/2022 CLINICAL DATA:  Pain after fall EXAM: LEFT SHOULDER - 2+ VIEW COMPARISON:  None Available. FINDINGS: There is no evidence of fracture or dislocation. There is no evidence of arthropathy or other focal bone abnormality. Soft tissues are unremarkable. IMPRESSION: Negative. Electronically Signed   By: Gerome Sam III M.D.   On: 07/27/2022 18:53    Procedures Reduction of dislocation  Date/Time: 07/27/2022 7:04 PM  Performed by: Peter Garter, PA Authorized by: Peter Garter, PA  Consent: Verbal consent obtained. Consent given by: patient Patient understanding: patient states understanding of the procedure being performed Patient consent: the patient's understanding of the procedure matches consent given Procedure consent: procedure consent matches procedure scheduled Relevant documents: relevant documents present and verified Test results: test results available and properly labeled Patient identity confirmed: verbally with patient Time out: Immediately prior to procedure a "time out" was called to verify the correct patient, procedure, equipment, support staff and site/side marked as required. Preparation: Patient was prepped and draped in the usual sterile fashion. Local anesthesia used: yes Anesthesia: nerve block  Anesthesia: Local anesthesia used: yes Local Anesthetic: bupivacaine 0.5% without epinephrine Anesthetic total: 5 mL  Sedation: Patient sedated: no  Patient tolerance: patient tolerated the procedure well with no immediate complications Comments: Reduction was successful with patient able to flex finger fully postprocedure.  Patient will be placed in splint after  postreduction imaging obtained.     {Document cardiac monitor, telemetry assessment procedure when appropriate:1}  Medications Ordered in ED Medications  bupivacaine(PF) (MARCAINE) 0.5 % injection 10 mL (has no administration in time range)    ED Course/ Medical Decision Making/ A&P   {   Click here for ABCD2, HEART and other calculatorsREFRESH Note before signing :1}                          Medical Decision Making Amount and/or Complexity of Data Reviewed Radiology: ordered.  Risk Prescription drug management.   This patient presents to the ED for concern of fall, this involves an extensive number of treatment options, and is a  complaint that carries with it a high risk of complications and morbidity.  The differential diagnosis includes fracture, dislocation, strain/sprain, ligamentous/tendinous injury, neurovascular compromise, CVA, spinal cord injury, solid organ damage, pneumothorax   Co morbidities that complicate the patient evaluation  See HPI   Additional history obtained:  Additional history obtained from EMR External records from outside source obtained and reviewed including hospital records   Lab Tests:  N/a   Imaging Studies ordered:  I ordered imaging studies including left shoulder x-ray, left hand x-ray, right knee x-ray I independently visualized and interpreted imaging which showed  Left shoulder x-ray: No acute osseous abnormality Left hand x-ray: Dorsal dislocation and PIP joint of left middle finger.  No definitive fracture.  Degenerative changes noted in multi interphalangeal and metacarpal phalangeal joints.  Severe degenerative changes noted in left wrist. Right knee x-ray: No osseous abnormality.  Thickening and calcifications in infra patellar tendon suggesting possibility calcific tendinosis I agree with the radiologist interpretation  Cardiac Monitoring: / EKG:  The patient was maintained on a cardiac monitor.  I personally viewed  and interpreted the cardiac monitored which showed an underlying rhythm of: Sinus rhythm   Consultations Obtained:  N/a   Problem List / ED Course / Critical interventions / Medication management  Fall, left shoulder pain, dislocation of left third digit I ordered medication including Marcaine   Reevaluation of the patient after these medicines showed that the patient improved I have reviewed the patients home medicines and have made adjustments as needed   Social Determinants of Health:  Alcohol use.  Denies illicit drug use   Test / Admission - Considered:  Fall, left shoulder pain, dislocation of left third digit Vitals signs  within normal range and stable throughout visit. Laboratory/imaging studies significant for: See above 62 year old male presents emergency department after mechanical fall landing on left arm.  Patient with evidence of dislocation of PIP of left third digit which was recently relocated in manner as depicted above and placed in finger splint.  Patient with minor trauma to head but without any acute neurologic deficit, anticoagulation use.  CT Canadian score indicated no clinical indication of CT imaging of the head/C-spine so none was pursued.  Imaging of the shoulder was negative for any acute fracture or dislocation.  Patient recommended treatment of pain at home with Tylenol/Motrin as well as follow-up with hand specialist regarding dislocation of affected digit.  Patient overall well-appearing, afebrile in no acute distress.  Treatment plan discussed at length with patient and he acknowledged understanding was agreeable to said plan. Worrisome signs and symptoms were discussed with the patient, and the patient acknowledged understanding to return to the ED if noticed. Patient was stable upon discharge.    {Document critical care time when appropriate:1} {Document review of labs and clinical decision tools ie heart score, Chads2Vasc2 etc:1}  {Document your  independent review of radiology images, and any outside records:1} {Document your discussion with family members, caretakers, and with consultants:1} {Document social determinants of health affecting pt's care:1} {Document your decision making why or why not admission, treatments were needed:1} Final Clinical Impression(s) / ED Diagnoses Final diagnoses:  Fall, initial encounter  Dislocation of finger, initial encounter  Acute pain of left shoulder    Rx / DC Orders ED Discharge Orders     None

## 2022-07-27 NOTE — ED Provider Notes (Signed)
Postreduction film with interval reduction   Gailen Shelter, [place] 07/27/22 2027    Derwood Kaplan, MD 07/28/22 1635

## 2022-07-27 NOTE — ED Triage Notes (Signed)
Fall around 3pm. Tripped over boots. Hurt left side of body, scapped knee, deformity to right finger/hand and pain in right shoulder. Denies passing out, or head injury Unknown tetanus date

## 2023-04-23 ENCOUNTER — Emergency Department (HOSPITAL_BASED_OUTPATIENT_CLINIC_OR_DEPARTMENT_OTHER): Payer: No Typology Code available for payment source

## 2023-04-23 ENCOUNTER — Emergency Department (HOSPITAL_COMMUNITY)
Admission: EM | Admit: 2023-04-23 | Discharge: 2023-04-23 | Disposition: A | Discharge status: Home / Self Care | Payer: No Typology Code available for payment source | Attending: Emergency Medicine | Admitting: Emergency Medicine

## 2023-04-23 ENCOUNTER — Other Ambulatory Visit: Payer: Self-pay

## 2023-04-23 ENCOUNTER — Encounter (HOSPITAL_COMMUNITY): Payer: Self-pay

## 2023-04-23 DIAGNOSIS — R000 Tachycardia, unspecified: Secondary | ICD-10-CM | POA: Diagnosis not present

## 2023-04-23 DIAGNOSIS — I10 Essential (primary) hypertension: Secondary | ICD-10-CM | POA: Diagnosis not present

## 2023-04-23 DIAGNOSIS — L02419 Cutaneous abscess of limb, unspecified: Secondary | ICD-10-CM

## 2023-04-23 DIAGNOSIS — R1030 Lower abdominal pain, unspecified: Secondary | ICD-10-CM | POA: Insufficient documentation

## 2023-04-23 DIAGNOSIS — Z85528 Personal history of other malignant neoplasm of kidney: Secondary | ICD-10-CM | POA: Diagnosis not present

## 2023-04-23 DIAGNOSIS — L03116 Cellulitis of left lower limb: Secondary | ICD-10-CM | POA: Insufficient documentation

## 2023-04-23 DIAGNOSIS — M7989 Other specified soft tissue disorders: Secondary | ICD-10-CM

## 2023-04-23 LAB — URINALYSIS, ROUTINE W REFLEX MICROSCOPIC
Bacteria, UA: NONE SEEN
Bilirubin Urine: NEGATIVE
Glucose, UA: NEGATIVE mg/dL
Hgb urine dipstick: NEGATIVE
Ketones, ur: NEGATIVE mg/dL
Leukocytes,Ua: NEGATIVE
Nitrite: NEGATIVE
Protein, ur: 30 mg/dL — AB
Specific Gravity, Urine: 1.02 (ref 1.005–1.030)
pH: 6 (ref 5.0–8.0)

## 2023-04-23 LAB — CBC WITH DIFFERENTIAL/PLATELET
Abs Immature Granulocytes: 0.04 10*3/uL (ref 0.00–0.07)
Basophils Absolute: 0 10*3/uL (ref 0.0–0.1)
Basophils Relative: 0 %
Eosinophils Absolute: 0 10*3/uL (ref 0.0–0.5)
Eosinophils Relative: 0 %
HCT: 43.6 % (ref 39.0–52.0)
Hemoglobin: 14.9 g/dL (ref 13.0–17.0)
Immature Granulocytes: 1 %
Lymphocytes Relative: 12 %
Lymphs Abs: 1 10*3/uL (ref 0.7–4.0)
MCH: 32.8 pg (ref 26.0–34.0)
MCHC: 34.2 g/dL (ref 30.0–36.0)
MCV: 96 fL (ref 80.0–100.0)
Monocytes Absolute: 0.6 10*3/uL (ref 0.1–1.0)
Monocytes Relative: 7 %
Neutro Abs: 6.6 10*3/uL (ref 1.7–7.7)
Neutrophils Relative %: 80 %
Platelets: 276 10*3/uL (ref 150–400)
RBC: 4.54 MIL/uL (ref 4.22–5.81)
RDW: 12.8 % (ref 11.5–15.5)
WBC: 8.2 10*3/uL (ref 4.0–10.5)
nRBC: 0 % (ref 0.0–0.2)

## 2023-04-23 LAB — COMPREHENSIVE METABOLIC PANEL
ALT: 28 U/L (ref 0–44)
AST: 31 U/L (ref 15–41)
Albumin: 4.2 g/dL (ref 3.5–5.0)
Alkaline Phosphatase: 89 U/L (ref 38–126)
Anion gap: 15 (ref 5–15)
BUN: 12 mg/dL (ref 8–23)
CO2: 22 mmol/L (ref 22–32)
Calcium: 9.7 mg/dL (ref 8.9–10.3)
Chloride: 101 mmol/L (ref 98–111)
Creatinine, Ser: 1.05 mg/dL (ref 0.61–1.24)
GFR, Estimated: >60 mL/min (ref >60)
Glucose, Bld: 142 mg/dL — ABNORMAL HIGH (ref 70–99)
Potassium: 3.4 mmol/L — ABNORMAL LOW (ref 3.5–5.1)
Sodium: 138 mmol/L (ref 135–145)
Total Bilirubin: 1.5 mg/dL — ABNORMAL HIGH (ref 0.0–1.2)
Total Protein: 8.8 g/dL — ABNORMAL HIGH (ref 6.5–8.1)

## 2023-04-23 LAB — I-STAT CG4 LACTIC ACID, ED
Lactic Acid, Venous: 2.5 mmol/L (ref 0.5–1.9)
Lactic Acid, Venous: 0.9 mmol/L (ref 0.5–1.9)

## 2023-04-23 LAB — LIPASE, BLOOD: Lipase: 39 U/L (ref 11–51)

## 2023-04-23 MED ORDER — SODIUM CHLORIDE 0.9 % IV SOLN: 2.0000 g | Freq: Once | INTRAVENOUS | Status: DC

## 2023-04-23 MED ORDER — MORPHINE SULFATE (PF) 4 MG/ML IV SOLN
4.0000 mg | Freq: Once | INTRAVENOUS | Status: AC
  Administered 2023-04-23: 4 mg via INTRAVENOUS
  Filled 2023-04-23: qty 1

## 2023-04-23 MED ORDER — SODIUM CHLORIDE 0.9 % IV BOLUS
1000.0000 mL | Freq: Once | INTRAVENOUS | Status: AC
  Administered 2023-04-23: 1000 mL via INTRAVENOUS

## 2023-04-23 MED ORDER — ACETAMINOPHEN 500 MG PO TABS
1000.0000 mg | ORAL_TABLET | Freq: Once | ORAL | Status: AC
  Administered 2023-04-23: 1000 mg via ORAL
  Filled 2023-04-23: qty 2

## 2023-04-23 MED ORDER — DOXYCYCLINE HYCLATE 100 MG PO CAPS
100.0000 mg | ORAL_CAPSULE | Freq: Two times a day (BID) | ORAL | 0 refills | Status: DC
  Filled 2023-04-23: qty 20, 10d supply, fill #0

## 2023-04-23 MED ORDER — DEXTROSE 5 % IV SOLN
1500.0000 mg | Freq: Once | INTRAVENOUS | Status: AC
  Administered 2023-04-23: 1500 mg via INTRAVENOUS
  Filled 2023-04-23: qty 75

## 2023-04-23 MED ORDER — IBUPROFEN 600 MG PO TABS
600.0000 mg | ORAL_TABLET | Freq: Four times a day (QID) | ORAL | 0 refills | Status: DC | PRN
  Filled 2023-04-23: qty 30, 8d supply, fill #0

## 2023-04-23 NOTE — ED Triage Notes (Signed)
 Leg redness and swelling in left foot that radiates up his leg. Left knee swelling. 2 scabs noted to left lower leg. Pt states this has been going on for a few weeks. Denies fever, chills, body aches.

## 2023-04-23 NOTE — Discharge Instructions (Signed)
 Your left foot swelling redness and pain is likely due to infection.  Please take antibiotic as prescribed.  Take ibuprofen as needed for pain.  You should start noticing improvement in the next 48 hours.  If you develop fever, worsening pain worsening redness or if you have concern please return to the ER for further evaluation.  Avoid alcohol use as it is negatively impacting your health.

## 2023-04-23 NOTE — Progress Notes (Signed)
 Pharmacy Note: [facility] ([facility]) for Acute Bacterial Skin and Skin Structure Infection (ABSSSI) Patients to [facility] Discharge   James [name] is a 63 y.o. male who presented to [facility] on 04/23/2023 with an ABSSSI.  Inclusion criteria:  Indication: Cellulitis  Patient has at least one SIRS criteria present: HR > 90   Exclusion criteria: Patient was evaluated and negative for hardware involvement, hypotension / shock, elevated lactate > 2 without other explanation, gram-negative infection risk factors (bites, water exposure, infection after trauma, infection after skin graft, burns, severe immunocompromise), necrotizing fasciitis possible / confirmed, known or suspected osteomyelitis or septic arthritis, endocarditis, diabetic foot infection, ischemic ulcers, post-operative wound infection, perirectal infection, need for drainage in the OR, hand / facial infections, injection drug users with  fever, bacteremia, pregnancy or breastfeeding, allergy related to antibiotics like vancomycin, known liver disease ( T.Bili > 2x ULN or AST/ALT > 3x ULN).  Lucia Gaskins 04/23/2023, 6:03 PM Clinical Pharmacist

## 2023-04-23 NOTE — ED Provider Notes (Signed)
 [place] EMERGENCY DEPARTMENT AT [facility] Provider Note   CSN: [id number] Arrival date & time: 04/23/23  1605     History  Chief Complaint  Patient presents with   Leg Swelling    James [name] is a 63 y.o. male.  The history is provided by the patient and medical records. No language interpreter was used.     63 year old male with history of recurrent left leg infection, alcohol abuse, alcohol-related pancreatitis, hypertension, renal cell cancer presenting complaining of leg pain.  Patient report for the past several weeks he has noticed increasing pain and swelling to both of his left knee as well as left foot.  For the past few days he noticed increasing pain and swelling to his left foot.  He also endorsed having cramping sensation in his abdomen that started a few hours ago.  He does not endorse any fever or chills no nausea vomiting or diarrhea no back pain no numbness or weakness and no recent injury.  He admits to heavy alcohol use drinking approximately 1/5 of liquor daily.  He has never been diagnosed with gout before.  He denies any prior history of PE or DVT.  Denies any specific treatment tried at home.  Home Medications Prior to Admission medications   Medication Sig Start Date End Date Taking? Authorizing Provider  cloNIDine (CATAPRES) 0.1 MG tablet Take 1 tablet (0.1 mg total) by mouth 4 (four) times daily for 2 days, THEN 1 tablet (0.1 mg total) 2 (two) times daily in the am and at bedtime. for 2 days, THEN 1 tablet (0.1 mg total) every morning for 2 days. 04/03/18 04/09/18  Benjiman Core, MD  dicyclomine (BENTYL) 20 MG tablet Take 1 tablet (20 mg total) by mouth every 6 (six) hours as needed for spasms. 04/03/18   Benjiman Core, MD  doxycycline (VIBRAMYCIN) 100 MG capsule Take 1 capsule (100 mg total) by mouth 2 (two) times daily. 06/13/20   Elson Areas, PA-C  Esomeprazole Magnesium (NEXIUM PO) Take 1 tablet by mouth daily.    [provider]  gabapentin (NEURONTIN) 300 MG capsule Take 900 mg by mouth 3 (three) times daily.     [provider]  HYDROcodone-acetaminophen (NORCO/VICODIN) 5-325 MG tablet Take 1 tablet by mouth every 6 (six) hours as needed for moderate pain. Patient not taking: Reported on 07/28/2017 08/09/16   Esterwood, Amy S, PA-C  hydrOXYzine (ATARAX/VISTARIL) 25 MG tablet Take 1 tablet (25 mg total) by mouth every 6 (six) hours as needed. 04/03/18   Benjiman Core, MD  loperamide (IMODIUM) 2 MG capsule Take 1 capsule (2 mg total) by mouth 4 (four) times daily as needed for diarrhea or loose stools. 04/03/18   Benjiman Core, MD  methocarbamol (ROBAXIN) 500 MG tablet Take 1 tablet (500 mg total) by mouth every 8 (eight) hours as needed for muscle spasms. 04/03/18   Benjiman Core, MD  naproxen (NAPROSYN) 500 MG tablet Take 1 tablet (500 mg total) by mouth 2 (two) times daily as needed. 04/03/18   Benjiman Core, MD  naproxen sodium (ALEVE) 220 MG tablet Take 440 mg by mouth 4 (four) times daily as needed (pain).    [provider]  omeprazole (PRILOSEC) 20 MG capsule Take 1 capsule (20 mg total) by mouth 2 (two) times daily before a meal. Patient not taking: Reported on 07/28/2017 08/06/16   Esterwood, Amy S, PA-C  ondansetron (ZOFRAN-ODT) 4 MG disintegrating tablet Take 1 tablet (4 mg total) by mouth  every 8 (eight) hours as needed for nausea or vomiting. 04/03/18   Benjiman Core, MD      Allergies    Sulfa antibiotics    Review of Systems   Review of Systems  All other systems reviewed and are negative.   Physical Exam Updated Vital Signs BP (!) 160/91   Pulse (!) 109   Temp 98.9 F (37.2 C) (Oral)   Resp 18   Ht 6' (1.829 m)   Wt 90.7 kg   SpO2 98%   BMI 27.12 kg/m  Physical Exam Vitals and nursing note reviewed.  Constitutional:      General: He is not in acute distress.    Appearance: He is well-developed.  HENT:     Head: Atraumatic.  Eyes:      Conjunctiva/sclera: Conjunctivae normal.  Cardiovascular:     Rate and Rhythm: Tachycardia present.     Pulses: Normal pulses.     Heart sounds: Normal heart sounds.  Pulmonary:     Effort: Pulmonary effort is normal.     Breath sounds: Normal breath sounds.  Abdominal:     Palpations: Abdomen is soft.     Tenderness: There is abdominal tenderness (Tenderness to suprapubic region.  Negative Murphy sign, no pain at McBurney's point.). There is no right CVA tenderness or left CVA tenderness.  Musculoskeletal:        General: Swelling (Left foot is erythematous and tender throughout.  Bursitis pedis pulse palpable with brisk cap refill.  Left ankle with full range of motion.  Swelling most notable in left great toe.) and tenderness (Left knee is edematous but able to flex and extend knee without any joint laxity and no surrounding erythema.  Patella is located.) present.     Cervical back: Neck supple.     Comments: Mild tenderness to left calf without significant edema erythema or warmth.  Skin:    Findings: No rash.  Neurological:     Mental Status: He is alert. Mental status is at baseline.     ED Results / Procedures / Treatments   Labs (all labs ordered are listed, but only abnormal results are displayed) Labs Reviewed  COMPREHENSIVE METABOLIC PANEL - Abnormal; Notable for the following components:      Result Value   Potassium 3.4 (*)    Glucose, Bld 142 (*)    Total Protein 8.8 (*)    Total Bilirubin 1.5 (*)    All other components within normal limits  URINALYSIS, ROUTINE W REFLEX MICROSCOPIC - Abnormal; Notable for the following components:   Protein, ur 30 (*)    All other components within normal limits  I-STAT CG4 LACTIC ACID, ED - Abnormal; Notable for the following components:   Lactic Acid, Venous 2.5 (*)    All other components within normal limits  CBC WITH DIFFERENTIAL/PLATELET  LIPASE, BLOOD  I-STAT CG4 LACTIC ACID, ED    EKG None  Radiology VAS [place]  LOWER EXTREMITY VENOUS (DVT) (7a-7p) Result Date: 04/23/2023  Lower Venous DVT Study Patient Name:  James [name]  Date of Exam:   04/23/2023 Medical Rec #: [id number]         Accession #:    [id number] Date of Birth: [birth year]         Patient Gender: M Patient Age:   77 years Exam Location:  [facility] Procedure:      VAS [place] LOWER EXTREMITY VENOUS (DVT) Referring Phys: Fayrene Helper --------------------------------------------------------------------------------  Indications: Left leg redness and swelling  in left foot that radiates up his leg.  Comparison Study: No recent priors. Performing Technologist: [place] Sink Sturdivant-Jones RDMS, RVT  Examination Guidelines: A complete evaluation includes B-mode imaging, spectral Doppler, color Doppler, and power Doppler as needed of all accessible portions of each vessel. Bilateral testing is considered an integral part of a complete examination. Limited examinations for reoccurring indications may be performed as noted. The reflux portion of the exam is performed with the patient in reverse Trendelenburg.  +-----+---------------+---------+-----------+----------+--------------+ RIGHTCompressibilityPhasicitySpontaneityPropertiesThrombus Aging +-----+---------------+---------+-----------+----------+--------------+ CFV  Full           Yes      Yes                                 +-----+---------------+---------+-----------+----------+--------------+ SFJ  Full                                                        +-----+---------------+---------+-----------+----------+--------------+   +---------+---------------+---------+-----------+----------+--------------+ LEFT     CompressibilityPhasicitySpontaneityPropertiesThrombus Aging +---------+---------------+---------+-----------+----------+--------------+ CFV      Full           Yes      Yes                                  +---------+---------------+---------+-----------+----------+--------------+ SFJ      Full                                                        +---------+---------------+---------+-----------+----------+--------------+ FV Prox  Full                                                        +---------+---------------+---------+-----------+----------+--------------+ FV Mid   Full                                                        +---------+---------------+---------+-----------+----------+--------------+ FV DistalFull                                                        +---------+---------------+---------+-----------+----------+--------------+ PFV      Full                                                        +---------+---------------+---------+-----------+----------+--------------+ POP      Full           Yes      Yes                                 +---------+---------------+---------+-----------+----------+--------------+  PTV      Full                                                        +---------+---------------+---------+-----------+----------+--------------+ PERO     Full                                                        +---------+---------------+---------+-----------+----------+--------------+     Summary: RIGHT: - No evidence of common femoral vein obstruction.   LEFT: - There is no evidence of deep vein thrombosis in the lower extremity.  - No cystic structure found in the popliteal fossa.  *See table(s) above for measurements and observations.    Preliminary     Procedures Procedures    Medications Ordered in ED Medications  morphine (PF) 4 MG/ML injection 4 mg (4 mg Intravenous Given 04/23/23 1711)  sodium chloride 0.9 % bolus 1,000 mL (0 mLs Intravenous Stopped 04/23/23 2005)  dalbavancin (DALVANCE) 1,500 mg in dextrose 5 % 500 mL IVPB (0 mg Intravenous Stopped 04/23/23 2005)  acetaminophen (TYLENOL) tablet 1,000 mg (1,000  mg Oral Given 04/23/23 2008)    ED Course/ Medical Decision Making/ A&P                                 Medical Decision Making Amount and/or Complexity of Data Reviewed Labs: ordered.  Risk OTC drugs. Prescription drug management.   BP (!) 160/91   Pulse (!) 109   Temp 98.9 F (37.2 C) (Oral)   Resp 18   Ht 6' (1.829 m)   Wt 90.7 kg   SpO2 98%   BMI 27.12 kg/m   14:8 PM  63 year old male with history of recurrent left leg infection, alcohol abuse, alcohol-related pancreatitis, hypertension, renal cell cancer presenting complaining of leg pain.  Patient report for the past several weeks he has noticed increasing pain and swelling to both of his left knee as well as left foot.  For the past few days he noticed increasing pain and swelling to his left foot.  He also endorsed having cramping sensation in his abdomen that started a few hours ago.  He does not endorse any fever or chills no nausea vomiting or diarrhea no back pain no numbness or weakness and no recent injury.  He admits to heavy alcohol use drinking approximately 1/5 of liquor daily.  He has never been diagnosed with gout before.  He denies any prior history of PE or DVT.  Denies any specific treatment tried at home.  On exam, patient has tenderness about his left foot and left knee.  His left foot is markedly erythematous and edematous with warmth suggestive of cellulitis versus gout.  Left ankle with full range of motion, low suspicion for septic joint about the ankle.  Patient is neurovascularly intact.  He does have some mild tenderness to the palpation of his left calf but no significant edema extending to his left calf region.  He does have swelling noted to his left knee however he is able to extend and flex his knee and no signs of septic  joint or cellulitis involving the knee.  His abdominal exam is notable for mild tenderness to periumbilical region.  No significant left upper quadrant tenderness although patient  does have history of alcohol-related pancreatitis and does drink alcohol heavily.  Vital signs notable for elevated blood pressure 160/91.  His oral temp is 98.9.  His heart rate is tachycardic at 109.  He is afebrile no hypoxia.  -Labs ordered, independently viewed and interpreted by me.  Labs remarkable for initial lactic acid of 2.5 which improved significantly after IV fluid.  It is now 0.9.  Normal WBC, electrolyte panels are reassuring, normal lipase. -The patient was maintained on a cardiac monitor.  I personally viewed and interpreted the cardiac monitored which showed an underlying rhythm of: Normal sinus rhythm -Imaging independently viewed and interpreted by me and I agree with radiologist's interpretation.  Result remarkable for vascular ultrasound of lower extremities without evidence of DVT -This patient presents to the ED for concern of leg pain, this involves an extensive number of treatment options, and is a complaint that carries with it a high risk of complications and morbidity.  The differential diagnosis includes cellulitis, gout, DVT, septic joint, claudication, fracture, dislocation, peripheral edema -Co morbidities that complicate the patient evaluation includes alcohol abuse -Treatment includes Dalvance, morphine, Tylenol, IV fluid -Reevaluation of the patient after these medicines showed that the patient improved -PCP office notes or outside notes reviewed -Escalation to admission/observation considered: patients feels much better, is comfortable with discharge, and will follow up with PCP -Prescription medication considered, patient comfortable with doxycycline, ibuprofen -Social Determinant of Health considered which includes alcohol abuse         Final Clinical Impression(s) / ED Diagnoses Final diagnoses:  Cellulitis of left foot    Rx / DC Orders ED Discharge Orders          Ordered    Ambulatory referral to Infectious Disease       Comments:  Cellulitis patient:  Received dalbavancin on 04/23/2023.   04/23/23 1808    doxycycline (VIBRAMYCIN) 100 MG capsule  2 times daily        04/23/23 2013    ibuprofen (ADVIL) 600 MG tablet  Every 6 hours PRN        04/23/23 2013              Fayrene Helper, PA-C 04/23/23 2014    Derwood Kaplan, MD 04/23/23 [phone]

## 2023-04-24 ENCOUNTER — Other Ambulatory Visit (HOSPITAL_COMMUNITY): Payer: Self-pay

## 2023-05-21 ENCOUNTER — Other Ambulatory Visit: Payer: Self-pay

## 2023-05-21 ENCOUNTER — Ambulatory Visit (INDEPENDENT_AMBULATORY_CARE_PROVIDER_SITE_OTHER): Admitting: Internal Medicine

## 2023-05-21 ENCOUNTER — Encounter: Payer: Self-pay | Admitting: Internal Medicine

## 2023-05-21 VITALS — BP 147/88 | HR 68 | Ht 74.0 in | Wt 210.0 lb

## 2023-05-21 DIAGNOSIS — R600 Localized edema: Secondary | ICD-10-CM | POA: Insufficient documentation

## 2023-05-21 NOTE — Progress Notes (Signed)
 [facility]      Reason for Consult:cellulitis    Referring Physician: Dr. Rhunette Croft    Patient ID: James [name], male    DOB: 04-25-1960, 63 y.o.   MRN: [id number]  HPI:   James [name] is here for evaluation of left leg cellulitis. He was seen in the emergency room about 1 month ago for significant leg pain, swelling and erythema and diagnosed with cellulitis and given a dose of dalbavancin.  Leg was described as being very swollen as well as erythematous and warmth.  Since then he has continued to have leg swelling and soreness with walking.  Also has a left knee swelling.  He has had no fever or chills.  He does not have a primary physician.  Also with some right leg swelling.   Past Medical History:  Diagnosis Date   Arthritis    right knee right wrist    Chronic kidney disease    GERD (gastroesophageal reflux disease)    Hypertension    pt states was treated with medication back in 2000; currently on no medication    Left renal mass    Melanoma (HCC) dx'd 60yrs ago age 65   rt lat chest wall--surg only   Pancreatitis    Renal cancer (HCC) dx'd 01/2015   left nephrectomy   Wears glasses     Prior to Admission medications   Medication Sig Start Date End Date Taking? Authorizing Provider  cloNIDine (CATAPRES) 0.1 MG tablet Take 1 tablet (0.1 mg total) by mouth 4 (four) times daily for 2 days, THEN 1 tablet (0.1 mg total) 2 (two) times daily in the am and at bedtime. for 2 days, THEN 1 tablet (0.1 mg total) every morning for 2 days. 04/03/18 04/09/18  Benjiman Core, MD  dicyclomine (BENTYL) 20 MG tablet Take 1 tablet (20 mg total) by mouth every 6 (six) hours as needed for spasms. 04/03/18   Benjiman Core, MD  doxycycline (VIBRAMYCIN) 100 MG capsule Take 1 capsule (100 mg total) by mouth 2 (two) times daily. 04/23/23   Fayrene Helper, PA-C  Esomeprazole Magnesium (NEXIUM PO) Take 1 tablet by mouth daily.    [provider]  gabapentin  (NEURONTIN) 300 MG capsule Take 900 mg by mouth 3 (three) times daily.     [provider]  HYDROcodone-acetaminophen (NORCO/VICODIN) 5-325 MG tablet Take 1 tablet by mouth every 6 (six) hours as needed for moderate pain. Patient not taking: Reported on 07/28/2017 08/09/16   Esterwood, Amy S, PA-C  hydrOXYzine (ATARAX/VISTARIL) 25 MG tablet Take 1 tablet (25 mg total) by mouth every 6 (six) hours as needed. 04/03/18   Benjiman Core, MD  ibuprofen (ADVIL) 600 MG tablet Take 1 tablet (600 mg total) by mouth every 6 (six) hours as needed. 04/23/23   Fayrene Helper, PA-C  loperamide (IMODIUM) 2 MG capsule Take 1 capsule (2 mg total) by mouth 4 (four) times daily as needed for diarrhea or loose stools. 04/03/18   Benjiman Core, MD  methocarbamol (ROBAXIN) 500 MG tablet Take 1 tablet (500 mg total) by mouth every 8 (eight) hours as needed for muscle spasms. 04/03/18   Benjiman Core, MD  naproxen (NAPROSYN) 500 MG tablet Take 1 tablet (500 mg total) by mouth 2 (two) times daily as needed. 04/03/18   Benjiman Core, MD  naproxen sodium (ALEVE) 220 MG tablet Take 440 mg by mouth 4 (four) times daily as needed (pain).    [provider]  omeprazole (  PRILOSEC) 20 MG capsule Take 1 capsule (20 mg total) by mouth 2 (two) times daily before a meal. Patient not taking: Reported on 07/28/2017 08/06/16   Esterwood, Amy S, PA-C  ondansetron (ZOFRAN-ODT) 4 MG disintegrating tablet Take 1 tablet (4 mg total) by mouth every 8 (eight) hours as needed for nausea or vomiting. 04/03/18   Benjiman Core, MD    Allergies  Allergen Reactions   Sulfa Antibiotics Swelling    Social History   Tobacco Use   Smoking status: Every Day    Current packs/day: 0.33    Average packs/day: 0.3 packs/day for 20.0 years (6.6 ttl pk-yrs)    Types: Cigarettes   Smokeless tobacco: Never  Vaping Use   Vaping status: Never Used  Substance Use Topics   Alcohol use: Yes   Drug use: No    Family History  Problem  Relation Age of Onset   Lung cancer Mother        non-smoker   Emphysema Father        smoker   Colon cancer Neg Hx    Esophageal cancer Neg Hx    Rectal cancer Neg Hx    Stomach cancer Neg Hx     Review of Systems  Constitutional: negative for fevers and chills All other systems reviewed and are negative    Constitutional: in no apparent distress There were no vitals filed for this visit. EYES: anicteric Respiratory: Normal respiratory effort Musculoskeletal: Left leg with 2+ pitting edema up to the knee plus a knee effusion.  No significant erythema, no warmth.  Right leg also with 1+ pitting edema.  Labs: Lab Results  Component Value Date   WBC 8.2 04/23/2023   HGB 14.9 04/23/2023   HCT 43.6 04/23/2023   MCV 96.0 04/23/2023   PLT 276 04/23/2023    Lab Results  Component Value Date   CREATININE 1.05 04/23/2023   BUN 12 04/23/2023   NA 138 04/23/2023   K 3.4 (L) 04/23/2023   CL 101 04/23/2023   CO2 22 04/23/2023    Lab Results  Component Value Date   ALT 28 04/23/2023   AST 31 04/23/2023   ALKPHOS 89 04/23/2023   BILITOT 1.5 (H) 04/23/2023   INR 1.18 08/20/2013     Assessment/Plan: At this time, no signs of cellulitis in his leg edema I suspect is a cardiac versus vascular issue.  He does not have a primary care physician and I recommended he establish with a primary care doctor first to have more evaluation of his leg edema.  He did have some improvement with furosemide.  Also discussed need to stop drinking which he understands.  No antibiotics indicated.  He can return here as needed

## 2023-06-30 ENCOUNTER — Encounter: Payer: Self-pay | Admitting: Nurse Practitioner

## 2023-06-30 ENCOUNTER — Other Ambulatory Visit: Payer: Self-pay

## 2023-06-30 ENCOUNTER — Ambulatory Visit (INDEPENDENT_AMBULATORY_CARE_PROVIDER_SITE_OTHER): Payer: Self-pay | Admitting: Nurse Practitioner

## 2023-06-30 ENCOUNTER — Other Ambulatory Visit (HOSPITAL_COMMUNITY): Payer: Self-pay

## 2023-06-30 VITALS — BP 122/53 | HR 67 | Temp 98.3°F | Wt 209.0 lb

## 2023-06-30 DIAGNOSIS — R600 Localized edema: Secondary | ICD-10-CM | POA: Diagnosis not present

## 2023-06-30 DIAGNOSIS — F1910 Other psychoactive substance abuse, uncomplicated: Secondary | ICD-10-CM | POA: Diagnosis not present

## 2023-06-30 LAB — POCT GLYCOSYLATED HEMOGLOBIN (HGB A1C): Hemoglobin A1C: 5 % (ref 4.0–5.6)

## 2023-06-30 MED ORDER — HYDROCHLOROTHIAZIDE 12.5 MG PO CAPS
12.5000 mg | ORAL_CAPSULE | Freq: Every day | ORAL | 2 refills | Status: DC
  Filled 2023-06-30: qty 90, 90d supply, fill #0

## 2023-06-30 NOTE — Patient Instructions (Addendum)
 1. Peripheral edema (Primary)  - Ambulatory referral to Cardiology - Comprehensive metabolic panel with GFR - hydrochlorothiazide  (MICROZIDE ) 12.5 MG capsule; Take 1 capsule (12.5 mg total) by mouth daily.  Dispense: 90 capsule; Refill: 2 - Uric Acid  2. Polysubstance abuse (HCC)  - AMB Referral [facility] Care Management

## 2023-06-30 NOTE — Addendum Note (Signed)
 Addended by: Jerrlyn Morel on: 06/30/2023 11:37 AM   Modules accepted: Level of Service

## 2023-06-30 NOTE — Progress Notes (Signed)
 Subjective   Patient ID: James [name], male    DOB: 1960-08-26, 63 y.o.   MRN: [id number]  Chief Complaint  Patient presents with   Medical Management of Chronic Issues    Patient stated that he is doing Heroin and cocaine want to stop   Establish Care    Referring provider: No ref. provider found  James [name] is a 63 y.o. male with Past Medical History: No date: Arthritis     Comment:  right knee right wrist  No date: Chronic kidney disease No date: GERD (gastroesophageal reflux disease) No date: Hypertension     Comment:  pt states was treated with medication back in 2000;               currently on no medication  No date: Left renal mass dx'd 8yrs ago age 46: Melanoma (HCC)     Comment:  rt lat chest wall--surg only No date: Pancreatitis dx'd 01/2015: Renal cancer (HCC)     Comment:  left nephrectomy No date: Wears glasses   HPI  Patient presents today to establish care.  He states that he has not had a primary care physician years.  He did recently have issues with cellulitis and edema to bilateral lower extremities.  He did follow with infectious disease and it was noted that the physician there thought this may be cardiac in nature.  We will place a referral to cardiology today.  Patient does have an issue with high blood pressures at home.  We will start her HCTZ daily.  Patient does admit that he drinks alcohol, smokes cigarettes, also uses heroin and cocaine.  He states that he uses heroin daily.  We will place a referral for social worker to help with resources for rehabilitation.  We will check labs today including uric acid for peripheral edema. Denies f/c/s, n/v/d, hemoptysis, PND, leg swelling Denies chest pain or edema    Allergies  Allergen Reactions   Sulfa Antibiotics Swelling    Immunization History  Administered Date(s) Administered   Influenza,inj,Quad PF,6+ Mos 12/15/2015   Pneumococcal Polysaccharide-23 12/15/2015   Tdap 09/21/2011     Tobacco History: Social History   Tobacco Use  Smoking Status Every Day   Current packs/day: 0.33   Average packs/day: 0.3 packs/day for 20.0 years (6.6 ttl pk-yrs)   Types: Cigarettes  Smokeless Tobacco Never   Ready to quit: Yes Counseling given: Yes   Outpatient Encounter Medications as of 06/30/2023  Medication Sig   esomeprazole (NEXIUM) 20 MG capsule Take by mouth.   hydrochlorothiazide  (MICROZIDE ) 12.5 MG capsule Take 1 capsule (12.5 mg total) by mouth daily.   Facility-Administered Encounter Medications as of 06/30/2023  Medication   0.9 %  sodium chloride  infusion    Review of Systems  Review of Systems  Constitutional: Negative.   HENT: Negative.    Cardiovascular: Negative.   Gastrointestinal: Negative.   Allergic/Immunologic: Negative.   Neurological: Negative.   Psychiatric/Behavioral: Negative.       Objective:   BP (!) 122/53   Pulse 67   Temp 98.3 F (36.8 C) (Oral)   Wt 209 lb (94.8 kg)   SpO2 98%   BMI 26.83 kg/m   Wt Readings from Last 5 Encounters:  06/30/23 209 lb (94.8 kg)  05/21/23 210 lb (95.3 kg)  04/23/23 200 lb (90.7 kg)  06/13/20 190 lb (86.2 kg)  07/28/17 195 lb (88.5 kg)     Physical Exam Vitals and nursing note reviewed.  Constitutional:      General: He is not in acute distress.    Appearance: He is well-developed.  Cardiovascular:     Rate and Rhythm: Normal rate and regular rhythm.  Pulmonary:     Effort: Pulmonary effort is normal.     Breath sounds: Normal breath sounds.  Skin:    General: Skin is warm and dry.  Neurological:     Mental Status: He is alert and oriented to person, place, and time.       Assessment & Plan:   Peripheral edema -     Ambulatory referral to Cardiology -     Comprehensive metabolic panel with GFR -     hydroCHLOROthiazide ; Take 1 capsule (12.5 mg total) by mouth daily.  Dispense: 90 capsule; Refill: 2 -     Uric acid  Polysubstance abuse (HCC) -     AMB Referral [facility]  Care Management     Return in about 4 weeks (around 07/28/2023) for edema.   Jerrlyn Morel, NP 06/30/2023

## 2023-06-30 NOTE — Addendum Note (Signed)
 Addended by: Ammon Bales D on: 06/30/2023 01:59 PM   Modules accepted: Orders

## 2023-07-01 ENCOUNTER — Telehealth: Payer: Self-pay | Admitting: *Deleted

## 2023-07-01 LAB — COMPREHENSIVE METABOLIC PANEL WITH GFR
ALT: 36 IU/L (ref 0–44)
AST: 50 IU/L — ABNORMAL HIGH (ref 0–40)
Albumin: 4.2 g/dL (ref 3.9–4.9)
Alkaline Phosphatase: 92 IU/L (ref 44–121)
BUN/Creatinine Ratio: 9 — ABNORMAL LOW (ref 10–24)
BUN: 8 mg/dL (ref 8–27)
Bilirubin Total: 0.5 mg/dL (ref 0.0–1.2)
CO2: 20 mmol/L (ref 20–29)
Calcium: 9.3 mg/dL (ref 8.6–10.2)
Chloride: 105 mmol/L (ref 96–106)
Creatinine, Ser: 0.88 mg/dL (ref 0.76–1.27)
Globulin, Total: 2.6 g/dL (ref 1.5–4.5)
Glucose: 89 mg/dL (ref 70–99)
Potassium: 4 mmol/L (ref 3.5–5.2)
Sodium: 141 mmol/L (ref 134–144)
Total Protein: 6.8 g/dL (ref 6.0–8.5)
eGFR: 97 mL/min/1.73

## 2023-07-01 LAB — URIC ACID: Uric Acid: 8.8 mg/dL — ABNORMAL HIGH (ref 3.8–8.4)

## 2023-07-01 NOTE — Progress Notes (Signed)
 Complex Care Management Note  Care Guide Note 07/01/2023 Name: [name] MRN: [id number] DOB: 08/18/1960  James [name] is a 63 y.o. year old male who sees Pcp, No for primary care. I reached out to Constantino Demark by phone today to offer complex care management services.  [name] was given information about Complex Care Management services today including:   The Complex Care Management services include support from the care team which includes your Nurse Care Manager, Clinical Social Worker, or Pharmacist.  The Complex Care Management team is here to help remove barriers to the health concerns and goals most important to you. Complex Care Management services are voluntary, and the patient may decline or stop services at any time by request to their care team member.   Complex Care Management Consent Status: Patient agreed to services and verbal consent obtained.   Follow up plan:  Telephone appointment with complex care management team member scheduled for:  5/15  Encounter Outcome:  Patient Scheduled  Barnie Bora  [facility]  [facility], [facility] Guide  Direct Dial: [phone]  Fax [phone]

## 2023-07-02 ENCOUNTER — Telehealth: Payer: Self-pay | Admitting: Nurse Practitioner

## 2023-07-02 NOTE — Telephone Encounter (Signed)
 Copied from CRM [id number]. Topic: Referral - Status >> Jul 02, 2023  1:04 PM Rennis Case wrote: Reason for CRM: Patient states James [name], referred him to an orthopedic provider ([facility]). Patient informed by [facility] that they do not accept [employer]. Patient was told they could see pt but cost would be $250 not including any x rays. Patient requesting to be referred to someone else that is network.    Please assist patient further

## 2023-07-03 ENCOUNTER — Other Ambulatory Visit: Payer: Self-pay | Admitting: Nurse Practitioner

## 2023-07-03 ENCOUNTER — Other Ambulatory Visit (HOSPITAL_COMMUNITY): Payer: Self-pay

## 2023-07-03 MED ORDER — PREDNISONE 10 MG PO TABS
ORAL_TABLET | ORAL | 0 refills | Status: DC
  Filled 2023-07-03: qty 20, 8d supply, fill #0

## 2023-07-07 NOTE — Telephone Encounter (Signed)
 I do not see a referral to ortho in the patient's chart.  Please advise.  Thank you

## 2023-07-10 ENCOUNTER — Other Ambulatory Visit: Payer: Self-pay | Admitting: Licensed Clinical Social Worker

## 2023-07-10 NOTE — Patient Instructions (Signed)
 Visit Information  James [name] was given information about Medicaid Managed Care team care coordination services as a part of their [employer] benefit. James [name] verbally consentedto engagement with the [facility] Managed Care team.   If you are experiencing a medical emergency, please call 911 or report to your local emergency department or urgent care.   If you have a non-emergency medical problem during routine business hours, please contact your provider's office and ask to speak with a nurse.   For questions related to your [employer] [facility] health plan, please call: [phone]  OR visit the member homepage at: [email or web address]  If you would like to schedule transportation through your [facility] plan, please call the following number at least 2 days in advance of your appointment: [phone]  If you are experiencing a behavioral health crisis, call the [employer] [place]  Behavioral Health Crisis Line at [phone]). The line is available 24 hours a day, seven days a week.  If you would like help to quit smoking, call 1-800-QUIT-NOW ((518)153-3505) OR Espaol: 1-855-Djelo-Ya (3-329-518-8416) o para ms informacin haga clic aqu or Text READY to 606-301 to register via text   James [name] - following are the goals we discussed in your visit today:    Goals Addressed             This Visit's Progress    COMPLETED: VBCI Social Work Care Plan       Problems:   Lacks knowledge of how to connect   CSW Clinical Goal(s):   Over the next 4 weeks the Patient will explore options for care management services through insurance and substance use treatment.  Interventions:  Substance Misuse in a patient with Arthritis:   Evaluation of current treatment plan related to misuse of: narcotics Active listening / Reflection utilized Reviewed contact  information for managed medicaid to request care management services. Reviewed resources for substance use treatment available with insurance.  Patient Goals/Self-Care Activities:  Follow up with insurance for care management services. Connect with [facility] for substance use treatment   Plan:   The patient has been provided with contact information for the care management team and has been advised to call with any health related questions or concerns.         Please see education materials related to community substance use treatment options provided by MyChart link.  Patient verbalizes understanding of instructions and care plan provided today and agrees to view in MyChart. Active MyChart status and patient understanding of how to access instructions and care plan via MyChart confirmed with patient.     No further follow up required: Please contact care guide if you would like to reconnect  Hale Level, LCSW [place], [facility] Licensed Clinical Social Worker Care Coordinator [phone]   Following is a copy of your plan of care:  There are no care plans that you recently modified to display for this patient.

## 2023-07-10 NOTE — Patient Outreach (Signed)
 Complex Care Management   Visit Note  07/10/2023  Name:  James [name] MRN: [id number] DOB: 06/19/1960  Situation: Referral received for Complex Care Management related to Substance Abuse/Misuse   I obtained verbal consent from Patient.  Visit completed with patient  on the phone  Background:   Past Medical History:  Diagnosis Date   Arthritis    right knee right wrist    Chronic kidney disease    GERD (gastroesophageal reflux disease)    Hypertension    pt states was treated with medication back in 2000; currently on no medication    Left renal mass    Melanoma (HCC) dx'd 59yrs ago age 85   rt lat chest wall--surg only   Pancreatitis    Renal cancer (HCC) dx'd 01/2015   left nephrectomy   Wears glasses     Assessment: Patient Reported Symptoms:  Cognitive Cognitive Status: Alert and oriented to person, place, and time, Insightful and able to interpret abstract concepts, Normal speech and language skills Cognitive/Intellectual Conditions Management [RPT]: None reported or documented in medical history or problem list   Health Maintenance Behaviors: Annual physical exam, Stress management Healing Pattern: Unsure Health Facilitated by: Stress management  Neurological Neurological Review of Symptoms: No symptoms reported Neurological Management Strategies: Activity, Coping strategies  HEENT HEENT Symptoms Reported: No symptoms reported      Cardiovascular Cardiovascular Symptoms Reported: No symptoms reported Does patient have uncontrolled Hypertension?: Yes Is patient checking Blood Pressure at home?: No Patient's Recent BP reading at home: Reports BP was high at initial appt - he has been started on medication Cardiovascular Conditions: Hypertension Cardiovascular Management Strategies: Activity, Medication therapy Cardiovascular Self-Management Outcome: 3 (uncertain) Cardiovascular Comment: Has follow up with PCP  Respiratory Respiratory Symptoms Reported: No  symptoms reported    Endocrine Patient reports the following symptoms related to hypoglycemia or hyperglycemia : No symptoms reported Is patient diabetic?: No    Gastrointestinal Gastrointestinal Symptoms Reported: No symptoms reported      Genitourinary Genitourinary Symptoms Reported: No symptoms reported    Integumentary Integumentary Symptoms Reported: No symptoms reported    Musculoskeletal Musculoskelatal Symptoms Reviewed: Muscle pain Additional Musculoskeletal Details: Reports cramps in leg muscles and in fingers - advised to follow up with PCP at next appt Musculoskeletal Conditions: Rheumatoid arthritis Musculoskeletal Management Strategies: Medication therapy Musculoskeletal Self-Management Outcome: 3 (uncertain) Falls in the past year?: No Number of falls in past year: 1 or less Was there an injury with Fall?: No Fall Risk Category Calculator: 0 Patient Fall Risk Level: Low Fall Risk Patient at Risk for Falls Due to: No Fall Risks  Psychosocial Psychosocial Symptoms Reported: Anxiety - if selected complete GAD, Substance use Additional Psychological Details: 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 - wants to detox 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 Conditions: Substance use, Anxiety Substance Use: Alcohol, Drugs Behavioral Management Strategies: Activity, Counseling, Medication therapy Behavioral Health Self-Management Outcome: 3 (uncertain) Behavioral Health Comment: Referred to [facility] Major Change/Loss/Stressor/Fears (CP): Medical condition, self Techniques to Cope with Loss/Stress/Change: Medication Quality of Family Relationships: helpful, involved, supportive Do you feel physically threatened by others?: No      07/10/2023   11:28 AM  Depression screen PHQ 2/9  Decreased Interest 0  Down, Depressed, Hopeless 0  PHQ - 2 Score 0    There were no vitals filed for this visit.  Medications Reviewed Today      Reviewed by Jens Molder, LCSW (Social Worker)  on 07/10/23 at 1113  Med List Status: <None>   Medication Order Taking? Sig Documenting Provider Last Dose Status Informant  0.9 %  sodium chloride  infusion [id number]   Nandigam, Kavitha V, MD  Active   esomeprazole (NEXIUM) 20 MG capsule [id number] Yes Take by mouth. [provider] Taking Active   hydrochlorothiazide  (MICROZIDE ) 12.5 MG capsule [id number] Yes Take 1 capsule (12.5 mg total) by mouth daily. Jerrlyn Morel, NP Taking Active   predniSONE  (DELTASONE ) 10 MG tablet [id number] Yes Take 4 tabs once a day for 2 days, then 3 tabs daily for 2 days, then 2 tabs daily for 2 days, then 1 tab daily for 2 days, then stop. Jerrlyn Morel, NP Taking Active             Recommendation:   Follow up with insurance for care management services. Connect with [facility] for substance use treatment.  Follow Up Plan:   Patient has met all care management goals. Care Management case will be closed. Patient has been provided contact information should new needs arise.   Hale Level, LCSW [place], [facility] Licensed Clinical Social Worker Care Coordinator [phone]

## 2023-07-28 ENCOUNTER — Encounter: Payer: Self-pay | Admitting: Nurse Practitioner

## 2023-07-28 ENCOUNTER — Other Ambulatory Visit (HOSPITAL_BASED_OUTPATIENT_CLINIC_OR_DEPARTMENT_OTHER): Payer: Self-pay

## 2023-07-28 ENCOUNTER — Telehealth: Payer: Self-pay

## 2023-07-28 ENCOUNTER — Other Ambulatory Visit (HOSPITAL_COMMUNITY): Payer: Self-pay

## 2023-07-28 ENCOUNTER — Ambulatory Visit (INDEPENDENT_AMBULATORY_CARE_PROVIDER_SITE_OTHER): Payer: Self-pay | Admitting: Nurse Practitioner

## 2023-07-28 VITALS — BP 116/64 | HR 73 | Temp 98.5°F | Wt 208.8 lb

## 2023-07-28 DIAGNOSIS — R600 Localized edema: Secondary | ICD-10-CM

## 2023-07-28 DIAGNOSIS — M109 Gout, unspecified: Secondary | ICD-10-CM

## 2023-07-28 MED ORDER — ALLOPURINOL 100 MG PO TABS
100.0000 mg | ORAL_TABLET | Freq: Every day | ORAL | 6 refills | Status: DC
  Filled 2023-07-28: qty 30, 30d supply, fill #0
  Filled 2023-09-18: qty 30, 30d supply, fill #1
  Filled 2023-10-22: qty 30, 30d supply, fill #2

## 2023-07-28 NOTE — Telephone Encounter (Signed)
 Sent to provider

## 2023-07-28 NOTE — Progress Notes (Unsigned)
 Subjective   Patient ID: James [name], male    DOB: Apr 13, 1960, 63 y.o.   MRN: [id number]  Chief Complaint  Patient presents with   Medical Management of Chronic Issues    Follow up on leg swelling     Referring provider: No ref. provider found  James [name] is a 63 y.o. male with Past Medical History: No date: Arthritis     Comment:  right knee right wrist  No date: Chronic kidney disease No date: GERD (gastroesophageal reflux disease) No date: Hypertension     Comment:  pt states was treated with medication back in 2000;               currently on no medication  No date: Left renal mass dx'd 6yrs ago age 18: Melanoma (HCC)     Comment:  rt lat chest wall--surg only No date: Pancreatitis dx'd 01/2015: Renal cancer (HCC)     Comment:  left nephrectomy No date: Wears glasses   HPI  Patient presents today for follow-up visit.  He does continue to have peripheral edema.  At his last visit here he was referred to cardiology but did not follow-up with them.  Will place another referral to vascular today.  Patient did have elevated uric acid level at last visit.  We will trial allopurinol. Denies f/c/s, n/v/d, hemoptysis, PND, leg swelling Denies chest pain or edema     Allergies  Allergen Reactions   Sulfa Antibiotics Swelling    Immunization History  Administered Date(s) Administered   Influenza,inj,Quad PF,6+ Mos 12/15/2015   Pneumococcal Polysaccharide-23 12/15/2015   Tdap 09/21/2011    Tobacco History: Social History   Tobacco Use  Smoking Status Every Day   Current packs/day: 0.33   Average packs/day: 0.3 packs/day for 20.0 years (6.6 ttl pk-yrs)   Types: Cigarettes  Smokeless Tobacco Never   Ready to quit: Yes Counseling given: Yes   Outpatient Encounter Medications as of 07/28/2023  Medication Sig   allopurinol (ZYLOPRIM) 100 MG tablet Take 1 tablet (100 mg total) by mouth daily.   esomeprazole (NEXIUM) 20 MG capsule Take by mouth.    hydrochlorothiazide  (MICROZIDE ) 12.5 MG capsule Take 1 capsule (12.5 mg total) by mouth daily.   predniSONE  (DELTASONE ) 10 MG tablet Take 4 tabs once a day for 2 days, then 3 tabs daily for 2 days, then 2 tabs daily for 2 days, then 1 tab daily for 2 days, then stop. (Patient not taking: Reported on 07/28/2023)   Facility-Administered Encounter Medications as of 07/28/2023  Medication   0.9 %  sodium chloride  infusion    Review of Systems  Review of Systems  Constitutional: Negative.   HENT: Negative.    Cardiovascular: Negative.   Gastrointestinal: Negative.   Allergic/Immunologic: Negative.   Neurological: Negative.   Psychiatric/Behavioral: Negative.       Objective:   BP 116/64   Pulse 73   Temp 98.5 F (36.9 C) (Oral)   Wt 208 lb 12.8 oz (94.7 kg)   SpO2 95%   BMI 26.81 kg/m   Wt Readings from Last 5 Encounters:  07/28/23 208 lb 12.8 oz (94.7 kg)  06/30/23 209 lb (94.8 kg)  05/21/23 210 lb (95.3 kg)  04/23/23 200 lb (90.7 kg)  06/13/20 190 lb (86.2 kg)     Physical Exam Vitals and nursing note reviewed.  Constitutional:      General: He is not in acute distress.    Appearance: He is well-developed.  Cardiovascular:  Rate and Rhythm: Normal rate and regular rhythm.  Pulmonary:     Effort: Pulmonary effort is normal.     Breath sounds: Normal breath sounds.  Skin:    General: Skin is warm and dry.  Neurological:     Mental Status: He is alert and oriented to person, place, and time.       Assessment & Plan:   Peripheral edema -     Ambulatory referral to Vascular Surgery  Gout, unspecified cause, unspecified chronicity, unspecified site -     Allopurinol; Take 1 tablet (100 mg total) by mouth daily.  Dispense: 30 tablet; Refill: 6     Return in about 3 months (around 10/28/2023).   Jerrlyn Morel, NP 07/31/2023

## 2023-07-30 ENCOUNTER — Telehealth: Payer: Self-pay

## 2023-07-30 NOTE — Telephone Encounter (Signed)
 Copied from CRM [id number]. Topic: Referral - Question >> Jul 30, 2023  3:06 PM Star East wrote: Reason for CRM: The doctor's office in [place] is telling the patient he needs a referral from [facility]- please call [phone] with an update

## 2023-07-31 NOTE — Patient Instructions (Signed)
 1. Peripheral edema (Primary)  - Ambulatory referral to Vascular Surgery  2. Gout, unspecified cause, unspecified chronicity, unspecified site  - allopurinol (ZYLOPRIM) 100 MG tablet; Take 1 tablet (100 mg total) by mouth daily.  Dispense: 30 tablet; Refill: 6

## 2023-08-04 ENCOUNTER — Encounter (INDEPENDENT_AMBULATORY_CARE_PROVIDER_SITE_OTHER): Payer: Self-pay | Admitting: Vascular Surgery

## 2023-08-11 NOTE — Telephone Encounter (Signed)
 Which referral is it?  He has several.

## 2023-08-25 ENCOUNTER — Inpatient Hospital Stay (HOSPITAL_COMMUNITY)
Admission: EM | Admit: 2023-08-25 | Discharge: 2023-08-28 | DRG: 440 | Disposition: A | Discharge status: Home / Self Care | Attending: Family Medicine | Admitting: Family Medicine

## 2023-08-25 ENCOUNTER — Emergency Department (HOSPITAL_COMMUNITY)

## 2023-08-25 ENCOUNTER — Encounter (HOSPITAL_COMMUNITY): Payer: Self-pay | Admitting: Emergency Medicine

## 2023-08-25 ENCOUNTER — Other Ambulatory Visit: Payer: Self-pay

## 2023-08-25 DIAGNOSIS — Z825 Family history of asthma and other chronic lower respiratory diseases: Secondary | ICD-10-CM

## 2023-08-25 DIAGNOSIS — Z79899 Other long term (current) drug therapy: Secondary | ICD-10-CM

## 2023-08-25 DIAGNOSIS — K859 Acute pancreatitis without necrosis or infection, unspecified: Principal | ICD-10-CM | POA: Diagnosis present

## 2023-08-25 DIAGNOSIS — F1090 Alcohol use, unspecified, uncomplicated: Secondary | ICD-10-CM | POA: Diagnosis not present

## 2023-08-25 DIAGNOSIS — K8500 Idiopathic acute pancreatitis without necrosis or infection: Principal | ICD-10-CM

## 2023-08-25 DIAGNOSIS — I10 Essential (primary) hypertension: Secondary | ICD-10-CM | POA: Diagnosis present

## 2023-08-25 DIAGNOSIS — Z85528 Personal history of other malignant neoplasm of kidney: Secondary | ICD-10-CM

## 2023-08-25 DIAGNOSIS — R7401 Elevation of levels of liver transaminase levels: Secondary | ICD-10-CM | POA: Diagnosis not present

## 2023-08-25 DIAGNOSIS — R112 Nausea with vomiting, unspecified: Secondary | ICD-10-CM | POA: Diagnosis present

## 2023-08-25 DIAGNOSIS — Z882 Allergy status to sulfonamides status: Secondary | ICD-10-CM

## 2023-08-25 DIAGNOSIS — Z801 Family history of malignant neoplasm of trachea, bronchus and lung: Secondary | ICD-10-CM

## 2023-08-25 DIAGNOSIS — F1010 Alcohol abuse, uncomplicated: Secondary | ICD-10-CM | POA: Diagnosis present

## 2023-08-25 DIAGNOSIS — K760 Fatty (change of) liver, not elsewhere classified: Secondary | ICD-10-CM | POA: Diagnosis present

## 2023-08-25 DIAGNOSIS — K852 Alcohol induced acute pancreatitis without necrosis or infection: Secondary | ICD-10-CM | POA: Diagnosis not present

## 2023-08-25 DIAGNOSIS — K219 Gastro-esophageal reflux disease without esophagitis: Secondary | ICD-10-CM | POA: Diagnosis present

## 2023-08-25 DIAGNOSIS — R1013 Epigastric pain: Secondary | ICD-10-CM | POA: Diagnosis present

## 2023-08-25 DIAGNOSIS — K860 Alcohol-induced chronic pancreatitis: Secondary | ICD-10-CM | POA: Diagnosis not present

## 2023-08-25 DIAGNOSIS — Z7141 Alcohol abuse counseling and surveillance of alcoholic: Secondary | ICD-10-CM

## 2023-08-25 DIAGNOSIS — Z85820 Personal history of malignant melanoma of skin: Secondary | ICD-10-CM

## 2023-08-25 DIAGNOSIS — E876 Hypokalemia: Secondary | ICD-10-CM | POA: Diagnosis present

## 2023-08-25 DIAGNOSIS — F17210 Nicotine dependence, cigarettes, uncomplicated: Secondary | ICD-10-CM | POA: Diagnosis present

## 2023-08-25 DIAGNOSIS — Z905 Acquired absence of kidney: Secondary | ICD-10-CM

## 2023-08-25 LAB — CBC
HCT: 45.8 % (ref 39.0–52.0)
Hemoglobin: 16.9 g/dL (ref 13.0–17.0)
MCH: 34.4 pg — ABNORMAL HIGH (ref 26.0–34.0)
MCHC: 36.9 g/dL — ABNORMAL HIGH (ref 30.0–36.0)
MCV: 93.3 fL (ref 80.0–100.0)
Platelets: 270 10*3/uL (ref 150–400)
RBC: 4.91 MIL/uL (ref 4.22–5.81)
RDW: 13.6 % (ref 11.5–15.5)
WBC: 7.5 10*3/uL (ref 4.0–10.5)
nRBC: 0 % (ref 0.0–0.2)

## 2023-08-25 LAB — COMPREHENSIVE METABOLIC PANEL WITH GFR
ALT: 49 U/L — ABNORMAL HIGH (ref 0–44)
AST: 65 U/L — ABNORMAL HIGH (ref 15–41)
Albumin: 4.6 g/dL (ref 3.5–5.0)
Alkaline Phosphatase: 78 U/L (ref 38–126)
Anion gap: 14 (ref 5–15)
BUN: 11 mg/dL (ref 8–23)
CO2: 20 mmol/L — ABNORMAL LOW (ref 22–32)
Calcium: 10.2 mg/dL (ref 8.9–10.3)
Chloride: 102 mmol/L (ref 98–111)
Creatinine, Ser: 0.98 mg/dL (ref 0.61–1.24)
GFR, Estimated: >60 mL/min (ref >60)
Glucose, Bld: 142 mg/dL — ABNORMAL HIGH (ref 70–99)
Potassium: 3.2 mmol/L — ABNORMAL LOW (ref 3.5–5.1)
Sodium: 136 mmol/L (ref 135–145)
Total Bilirubin: 2 mg/dL — ABNORMAL HIGH (ref 0.0–1.2)
Total Protein: 8.8 g/dL — ABNORMAL HIGH (ref 6.5–8.1)

## 2023-08-25 LAB — RAPID URINE DRUG SCREEN, HOSP PERFORMED
Amphetamines: NOT DETECTED
Barbiturates: NOT DETECTED
Benzodiazepines: NOT DETECTED
Cocaine: NOT DETECTED
Opiates: POSITIVE — AB
Tetrahydrocannabinol: NOT DETECTED

## 2023-08-25 LAB — URINALYSIS, ROUTINE W REFLEX MICROSCOPIC
Bilirubin Urine: NEGATIVE
Glucose, UA: NEGATIVE mg/dL
Hgb urine dipstick: NEGATIVE
Ketones, ur: 5 mg/dL — AB
Leukocytes,Ua: NEGATIVE
Nitrite: NEGATIVE
Protein, ur: NEGATIVE mg/dL
Specific Gravity, Urine: 1.028 (ref 1.005–1.030)
pH: 6 (ref 5.0–8.0)

## 2023-08-25 LAB — LIPASE, BLOOD: Lipase: 145 U/L — ABNORMAL HIGH (ref 11–51)

## 2023-08-25 LAB — MAGNESIUM: Magnesium: 1.9 mg/dL (ref 1.7–2.4)

## 2023-08-25 MED ORDER — LORAZEPAM 2 MG/ML IJ SOLN: 1.0000 mg | INTRAMUSCULAR | Status: DC | PRN

## 2023-08-25 MED ORDER — ACETAMINOPHEN 325 MG PO TABS
650.0000 mg | ORAL_TABLET | Freq: Four times a day (QID) | ORAL | Status: DC | PRN
  Administered 2023-08-27 – 2023-08-28 (×4): 650 mg via ORAL
  Filled 2023-08-25 (×4): qty 2

## 2023-08-25 MED ORDER — HYDROMORPHONE HCL 1 MG/ML IJ SOLN
0.5000 mg | Freq: Once | INTRAMUSCULAR | Status: AC
  Administered 2023-08-25: 0.5 mg via INTRAVENOUS
  Filled 2023-08-25: qty 1

## 2023-08-25 MED ORDER — POTASSIUM CHLORIDE 10 MEQ/100ML IV SOLN
10.0000 meq | INTRAVENOUS | Status: AC
  Administered 2023-08-25 – 2023-08-26 (×4): 10 meq via INTRAVENOUS
  Filled 2023-08-25 (×4): qty 100

## 2023-08-25 MED ORDER — FOLIC ACID 1 MG PO TABS
1.0000 mg | ORAL_TABLET | Freq: Every day | ORAL | Status: DC
  Administered 2023-08-26 – 2023-08-28 (×3): 1 mg via ORAL
  Filled 2023-08-25 (×3): qty 1

## 2023-08-25 MED ORDER — THIAMINE HCL 100 MG/ML IJ SOLN
100.0000 mg | Freq: Every day | INTRAMUSCULAR | Status: DC
  Administered 2023-08-25 – 2023-08-26 (×2): 100 mg via INTRAVENOUS
  Filled 2023-08-25 (×2): qty 2

## 2023-08-25 MED ORDER — MELATONIN 3 MG PO TABS
3.0000 mg | ORAL_TABLET | Freq: Every evening | ORAL | Status: DC | PRN
  Administered 2023-08-27: 3 mg via ORAL
  Filled 2023-08-25: qty 1

## 2023-08-25 MED ORDER — HYDROMORPHONE HCL 1 MG/ML IJ SOLN
0.5000 mg | INTRAMUSCULAR | Status: DC | PRN
  Administered 2023-08-26 – 2023-08-28 (×14): 0.5 mg via INTRAVENOUS
  Filled 2023-08-25 (×12): qty 0.5
  Filled 2023-08-25: qty 1
  Filled 2023-08-25: qty 0.5

## 2023-08-25 MED ORDER — ADULT MULTIVITAMIN W/MINERALS CH
1.0000 | ORAL_TABLET | Freq: Every day | ORAL | Status: DC
  Administered 2023-08-26 – 2023-08-28 (×3): 1 via ORAL
  Filled 2023-08-25 (×3): qty 1

## 2023-08-25 MED ORDER — ACETAMINOPHEN 650 MG RE SUPP: 650.0000 mg | Freq: Four times a day (QID) | RECTAL | Status: DC | PRN

## 2023-08-25 MED ORDER — LORAZEPAM 1 MG PO TABS
1.0000 mg | ORAL_TABLET | ORAL | Status: DC | PRN
  Administered 2023-08-26: 3 mg via ORAL
  Administered 2023-08-27 (×2): 1 mg via ORAL
  Filled 2023-08-25 (×2): qty 1
  Filled 2023-08-25: qty 3
  Filled 2023-08-25: qty 1

## 2023-08-25 MED ORDER — LACTATED RINGERS IV SOLN: INTRAVENOUS | Status: DC

## 2023-08-25 MED ORDER — IOHEXOL 300 MG/ML  SOLN
100.0000 mL | Freq: Once | INTRAMUSCULAR | Status: AC | PRN
  Administered 2023-08-25: 100 mL via INTRAVENOUS

## 2023-08-25 MED ORDER — ONDANSETRON HCL 4 MG/2ML IJ SOLN
4.0000 mg | Freq: Once | INTRAMUSCULAR | Status: AC
  Administered 2023-08-25: 4 mg via INTRAVENOUS
  Filled 2023-08-25: qty 2

## 2023-08-25 MED ORDER — HYDROMORPHONE HCL 1 MG/ML IJ SOLN
1.0000 mg | Freq: Once | INTRAMUSCULAR | Status: AC
  Administered 2023-08-25: 1 mg via INTRAVENOUS
  Filled 2023-08-25: qty 1

## 2023-08-25 MED ORDER — PANTOPRAZOLE SODIUM 40 MG IV SOLR
40.0000 mg | Freq: Once | INTRAVENOUS | Status: AC
  Administered 2023-08-25: 40 mg via INTRAVENOUS
  Filled 2023-08-25: qty 10

## 2023-08-25 MED ORDER — ONDANSETRON HCL 4 MG/2ML IJ SOLN
4.0000 mg | Freq: Four times a day (QID) | INTRAMUSCULAR | Status: DC | PRN
  Administered 2023-08-26: 4 mg via INTRAVENOUS
  Filled 2023-08-25 (×2): qty 2

## 2023-08-25 MED ORDER — SODIUM CHLORIDE 0.9 % IV BOLUS
500.0000 mL | Freq: Once | INTRAVENOUS | Status: AC
  Administered 2023-08-25: 500 mL via INTRAVENOUS

## 2023-08-25 MED ORDER — NALOXONE HCL 0.4 MG/ML IJ SOLN: 0.4000 mg | INTRAMUSCULAR | Status: DC | PRN

## 2023-08-25 NOTE — ED Triage Notes (Addendum)
 BIB EMS from home.  Abdominal pain x 2 days with intermittent sharp pain in LLQ.  Nausea/vomiting/diarrhea.  Hx of ETOH abuse per spouse but don't mention it to him  Pt does endorse drinking daily.  Last drink this morning but states he vomited it up

## 2023-08-25 NOTE — ED Provider Notes (Signed)
 James [name]   CSN: [id number] Arrival date & time: 08/25/23  1543     Patient presents with: Abdominal Pain   James [name] is a 63 y.o. male.   Remarkable for alcohol abuse, pancreatitis, renal cell carcinoma.  Patient states he is still drinking and had his last alcohol yesterday.  Patient complains of lower abdominal discomfort  The history is provided by the patient and medical records. No language interpreter was used.  Abdominal Pain Pain location:  LLQ and RLQ Pain quality: aching   Pain radiates to:  Does not radiate Pain severity:  Moderate Onset quality:  Sudden Timing:  Constant Progression:  Worsening Chronicity:  Recurrent Context: alcohol use   Relieved by:  Nothing Associated symptoms: no chest pain, no cough, no diarrhea, no fatigue and no hematuria        Prior to Admission medications   Medication Sig Start Date End Date Taking? Authorizing Provider  allopurinol  (ZYLOPRIM ) 100 MG tablet Take 1 tablet (100 mg total) by mouth daily. 07/28/23   Nichols, Tonya S, NP  esomeprazole (NEXIUM) 20 MG capsule Take by mouth.    [provider]  hydrochlorothiazide  (MICROZIDE ) 12.5 MG capsule Take 1 capsule (12.5 mg total) by mouth daily. 06/30/23   Oley Bascom RAMAN, NP  predniSONE  (DELTASONE ) 10 MG tablet Take 4 tabs once a day for 2 days, then 3 tabs daily for 2 days, then 2 tabs daily for 2 days, then 1 tab daily for 2 days, then stop. Patient not taking: Reported on 07/28/2023 07/03/23   Oley Bascom RAMAN, NP    Allergies: Sulfa antibiotics    Review of Systems  Constitutional:  Negative for appetite change and fatigue.  HENT:  Negative for congestion, ear discharge and sinus pressure.   Eyes:  Negative for discharge.  Respiratory:  Negative for cough.   Cardiovascular:  Negative for chest pain.  Gastrointestinal:  Positive for abdominal pain. Negative for diarrhea.  Genitourinary:  Negative for  frequency and hematuria.  Musculoskeletal:  Negative for back pain.  Skin:  Negative for rash.  Neurological:  Negative for seizures and headaches.  Psychiatric/Behavioral:  Negative for hallucinations.     Updated Vital Signs BP (!) 152/93 (BP Location: Right Arm)   Pulse 94   Temp 98.4 F (36.9 C) (Oral)   SpO2 100%   Physical Exam Vitals and nursing [name] reviewed.  Constitutional:      Appearance: He is well-developed.  HENT:     Head: Normocephalic.     Mouth/Throat:     Mouth: Mucous membranes are moist.   Eyes:     General: No scleral icterus.    Conjunctiva/sclera: Conjunctivae normal.   Neck:     Thyroid: No thyromegaly.   Cardiovascular:     Rate and Rhythm: Normal rate and regular rhythm.     Heart sounds: No murmur heard.    No friction rub. No gallop.  Pulmonary:     Breath sounds: No stridor. No wheezing or rales.  Chest:     Chest wall: No tenderness.  Abdominal:     General: There is no distension.     Tenderness: There is abdominal tenderness. There is no rebound.   Musculoskeletal:        General: Normal range of motion.     Cervical back: Neck supple.  Lymphadenopathy:     Cervical: No cervical adenopathy.   Skin:    Findings: No erythema or  rash.   Neurological:     Mental Status: He is alert and oriented to person, place, and time.     Motor: No abnormal muscle tone.     Coordination: Coordination normal.   Psychiatric:        Behavior: Behavior normal.     (all labs ordered are listed, but only abnormal results are displayed) Labs Reviewed  CBC - Abnormal; Notable for the following components:      Result Value   MCH 34.4 (*)    MCHC 36.9 (*)    All other components within normal limits  LIPASE, BLOOD  COMPREHENSIVE METABOLIC PANEL WITH GFR  URINALYSIS, ROUTINE W REFLEX MICROSCOPIC  POC OCCULT BLOOD, ED  TYPE AND SCREEN    EKG: None  Radiology: No results found.   Procedures   Medications Ordered in the ED   pantoprazole  (PROTONIX ) injection 40 mg (has no administration in time range)  sodium chloride  0.9 % bolus 500 mL (has no administration in time range)  HYDROmorphone  (DILAUDID ) injection 1 mg (has no administration in time range)  ondansetron  (ZOFRAN ) injection 4 mg (has no administration in time range)                                    Medical Decision Making Amount and/or Complexity of Data Reviewed Labs: ordered. Radiology: ordered.  Risk Prescription drug management. Decision regarding hospitalization.   Patient with pancreatitis.  He will be admitted to medicine     Final diagnoses:  None    ED Discharge Orders     None          Suzette Pac, MD 08/25/23 2152    Suzette Pac, MD 08/27/23 1204

## 2023-08-25 NOTE — H&P (Signed)
 History and Physical      James [name] FMW:[id number] DOB: [birth year] DOA: 08/25/2023; DOS: 08/25/2023  PCP: Oley Bascom RAMAN, NP *** Patient coming from: home ***  I have personally briefly reviewed patient's old medical records in [facility]  Chief Complaint: ***  HPI: James [name] is a 63 y.o. male with medical history significant for *** who is admitted to [facility] on 08/25/2023 with *** after presenting from home*** to [facility] ED complaining of ***.   ***        ***  ED Course:  Vital signs in the ED were notable for the following: ***  Labs were notable for the following: ***  Per my interpretation, EKG in ED demonstrated the following:  ***  Imaging in the ED, per corresponding formal radiology read, was notable for the following: ***  While in the ED, the following were administered: ***  Subsequently, the patient was admitted  ***  ***red   Review of Systems: As per HPI otherwise 10 point review of systems negative.   Past Medical History:  Diagnosis Date   Arthritis    right knee right wrist    Chronic kidney disease    GERD (gastroesophageal reflux disease)    Hypertension    pt states was treated with medication back in 2000; currently on no medication    Left renal mass    Melanoma (HCC) dx'd 74yrs ago age 74   rt lat chest wall--surg only   Pancreatitis    Renal cancer (HCC) dx'd 01/2015   left nephrectomy   Wears glasses     Past Surgical History:  Procedure Laterality Date   ABDOMINAL SURGERY  2017   remove tumor cyst kidney   COLONOSCOPY     CYSTOSCOPY WITH RETROGRADE PYELOGRAM, URETEROSCOPY AND STENT PLACEMENT Left 07/19/2015   Procedure: LEFT RETROGRADE PYELOGRAM, LEFT URETEROSCOPY AND LEFT URETERAL STENT PLACEMENT;  Surgeon: Morene LELON Salines, MD;  Location: [facility];  Service: Urology;  Laterality: Left;   drained cyst      left kidney 10 months ago    lymph nodes removed      right armpit secondary to  melanoma    NEPHRECTOMY Left 02/15/2015   Procedure: LEFT OPEN PARTIAL NEPHRECTOMY;  Surgeon: Morene LELON Salines, MD;  Location: [facility];  Service: Urology;  Laterality: Left;   PATELLAR TENDON REPAIR Right 03/27/2016   Procedure: RIGHT PATELLA TENDON REPAIR;  Surgeon: Oneil JAYSON Herald, MD;  Location: [facility] OR;  Service: Orthopedics;  Laterality: Right;   right armpit surgery     for melanoma    Social History:  reports that he has been smoking cigarettes. He has a 6.6 pack-year smoking history. He has never used smokeless tobacco. He reports current alcohol use. He reports current drug use. Drugs: Heroin and Cocaine.   Allergies  Allergen Reactions   Sulfa Antibiotics Swelling    Family History  Problem Relation Age of Onset   Lung cancer Mother        non-smoker   Emphysema Father        smoker   Colon cancer Neg Hx    Esophageal cancer Neg Hx    Rectal cancer Neg Hx    Stomach cancer Neg Hx     Family history reviewed and not pertinent ***   Prior to Admission medications   Medication Sig Start Date End Date Taking? Authorizing Provider  allopurinol  (ZYLOPRIM ) 100 MG tablet Take 1 tablet (100 mg total) by  mouth daily. 07/28/23   Nichols, Tonya S, NP  esomeprazole (NEXIUM) 20 MG capsule Take by mouth.    [provider]  hydrochlorothiazide  (MICROZIDE ) 12.5 MG capsule Take 1 capsule (12.5 mg total) by mouth daily. 06/30/23   Oley Bascom RAMAN, NP  predniSONE  (DELTASONE ) 10 MG tablet Take 4 tabs once a day for 2 days, then 3 tabs daily for 2 days, then 2 tabs daily for 2 days, then 1 tab daily for 2 days, then stop. Patient not taking: Reported on 07/28/2023 07/03/23   Oley Bascom RAMAN, NP     Objective    Physical Exam: Vitals:   08/25/23 2003 08/25/23 2015 08/25/23 2030 08/25/23 2130  BP:  (!) 152/94 (!) 151/94 (!) 153/96  Pulse:  (!) 57 (!) 48 73  Resp:   16 16  Temp:      TempSrc:      SpO2:  100% 100% 99%  Weight: 94.3 kg     Height: 6' 2 (1.88 m)        General: appears to be stated age; alert, oriented Skin: warm, dry, no rash Head:  AT/[place] Mouth:  Oral mucosa membranes appear moist, normal dentition Neck: supple; trachea midline Heart:  RRR; did not appreciate any M/R/G Lungs: CTAB, did not appreciate any wheezes, rales, or rhonchi Abdomen: + BS; soft, ND, NT Vascular: 2+ pedal pulses b/l; 2+ radial pulses b/l Extremities: no peripheral edema, no muscle wasting Neuro: strength and sensation intact in upper and lower extremities b/l    *** Neuro: 5/5 strength of the proximal and distal flexors and extensors of the upper and lower extremities bilaterally; sensation intact in upper and lower extremities b/l; cranial nerves II through XII grossly intact; no pronator drift; no evidence suggestive of slurred speech, dysarthria, or facial droop; Normal muscle tone. No tremors. *** Neuro: In the setting of the patient's current mental status and associated inability to follow instructions, unable to perform full neurologic exam at this time.  As such, assessment of strength, sensation, and cranial nerves is limited at this time. Patient noted to spontaneously move all 4 extremities. No tremors.  ***    Labs on Admission: I have personally reviewed following labs and imaging studies  CBC: Recent Labs  Lab 08/25/23 1558  WBC 7.5  HGB 16.9  HCT 45.8  MCV 93.3  PLT 270   Basic Metabolic Panel: Recent Labs  Lab 08/25/23 1558  NA 136  K 3.2*  CL 102  CO2 20*  GLUCOSE 142*  BUN 11  CREATININE 0.98  CALCIUM 10.2   GFR: Estimated Creatinine Clearance: 89.7 mL/min (by C-G formula based on SCr of 0.98 mg/dL). Liver Function Tests: Recent Labs  Lab 08/25/23 1558  AST 65*  ALT 49*  ALKPHOS 78  BILITOT 2.0*  PROT 8.8*  ALBUMIN 4.6   Recent Labs  Lab 08/25/23 1558  LIPASE 145*   No results for input(s): AMMONIA in the last 168 hours. Coagulation Profile: No results for input(s): INR, PROTIME in the last 168  hours. Cardiac Enzymes: No results for input(s): CKTOTAL, CKMB, CKMBINDEX, TROPONINI in the last 168 hours. BNP (last 3 results) No results for input(s): PROBNP in the last 8760 hours. HbA1C: No results for input(s): HGBA1C in the last 72 hours. CBG: No results for input(s): GLUCAP in the last 168 hours. Lipid Profile: No results for input(s): CHOL, HDL, LDLCALC, TRIG, CHOLHDL, LDLDIRECT in the last 72 hours. Thyroid Function Tests: No results for input(s): TSH, T4TOTAL, FREET4, T3FREE,  THYROIDAB in the last 72 hours. Anemia Panel: No results for input(s): VITAMINB12, FOLATE, FERRITIN, TIBC, IRON, RETICCTPCT in the last 72 hours. Urine analysis:    Component Value Date/Time   COLORURINE AMBER (A) 08/25/2023 1937   APPEARANCEUR CLEAR 08/25/2023 1937   LABSPEC 1.028 08/25/2023 1937   PHURINE 6.0 08/25/2023 1937   GLUCOSEU NEGATIVE 08/25/2023 1937   HGBUR NEGATIVE 08/25/2023 1937   BILIRUBINUR NEGATIVE 08/25/2023 1937   BILIRUBINUR negative 05/19/2017 1436   KETONESUR 5 (A) 08/25/2023 1937   PROTEINUR NEGATIVE 08/25/2023 1937   UROBILINOGEN 0.2 05/19/2017 1436   NITRITE NEGATIVE 08/25/2023 1937   LEUKOCYTESUR NEGATIVE 08/25/2023 1937    Radiological Exams on Admission: CT ABDOMEN PELVIS W CONTRAST Result Date: 08/25/2023 CLINICAL DATA:  Left-sided abdominal pain EXAM: CT ABDOMEN AND PELVIS WITH CONTRAST TECHNIQUE: Multidetector CT imaging of the abdomen and pelvis was performed using the standard protocol following bolus administration of intravenous contrast. RADIATION DOSE REDUCTION: This exam was performed according to the departmental dose-optimization program which includes automated exposure control, adjustment of the mA and/or kV according to patient size and/or use of iterative reconstruction technique. CONTRAST:  OMNIPAQUE  IOHEXOL  300 MG/ML  SOLN COMPARISON:  07/28/2017 FINDINGS: Lower chest: No acute abnormality.  Hepatobiliary: Fatty infiltration of the liver is noted. The gallbladder is within normal limits. Pancreas: Unremarkable. No pancreatic ductal dilatation or surrounding inflammatory changes. Spleen: Normal in size without focal abnormality. Adrenals/Urinary Tract: Adrenal glands are within normal limits. Some scarring is noted in the left kidney. No renal calculi or obstructive changes are seen. Bladder is well distended. Stomach/Bowel: Appendix is within normal limits. No obstructive or inflammatory changes of the colon are seen. Stomach is within normal limits. Mild dilatation in the mid jejunum is noted without definitive obstructing lesion. This may represent some mild enteritis. Vascular/Lymphatic: Aortic atherosclerosis. No enlarged abdominal or pelvic lymph nodes. Reproductive: Prostate is unremarkable. Other: No abdominal wall hernia or abnormality. No abdominopelvic ascites. Musculoskeletal: No acute or significant osseous findings. IMPRESSION: Mild dilatation in the mid jejunum which likely represents some enteritis. No true obstruction is seen. Fatty liver. Electronically Signed   By: Oneil Devonshire M.D.   On: 08/25/2023 20:16      Assessment/Plan    Principal Problem:   Acute pancreatitis  ***        ***                  ***                  ***                  ***                  ***                 ***                  ***                  ***                  ***                  ***                  ***                  ***                 ***  DVT prophylaxis: SCD's ***  Code Status: Full code*** Family Communication: none*** Disposition Plan: Per Rounding Team Consults called: none***;  Admission status: ***    I SPENT GREATER THAN 75 *** MINUTES IN  CLINICAL CARE TIME/MEDICAL DECISION-MAKING IN COMPLETING THIS ADMISSION.     Eva NOVAK Shenique Childers DO [facility] From 7PM - 7AM   08/25/2023, 9:57 PM   ***

## 2023-08-25 NOTE — ED Notes (Signed)
 RN provided pt with a urinal

## 2023-08-25 NOTE — ED Notes (Signed)
 Pt care taken, medication given, no other complaints.

## 2023-08-26 ENCOUNTER — Encounter (HOSPITAL_COMMUNITY): Payer: Self-pay | Admitting: Internal Medicine

## 2023-08-26 DIAGNOSIS — Z905 Acquired absence of kidney: Secondary | ICD-10-CM | POA: Diagnosis not present

## 2023-08-26 DIAGNOSIS — K8521 Alcohol induced acute pancreatitis with uninfected necrosis: Secondary | ICD-10-CM | POA: Diagnosis not present

## 2023-08-26 DIAGNOSIS — E876 Hypokalemia: Secondary | ICD-10-CM | POA: Diagnosis present

## 2023-08-26 DIAGNOSIS — F1090 Alcohol use, unspecified, uncomplicated: Secondary | ICD-10-CM | POA: Diagnosis not present

## 2023-08-26 DIAGNOSIS — Z85820 Personal history of malignant melanoma of skin: Secondary | ICD-10-CM | POA: Diagnosis not present

## 2023-08-26 DIAGNOSIS — I10 Essential (primary) hypertension: Secondary | ICD-10-CM | POA: Diagnosis present

## 2023-08-26 DIAGNOSIS — R1013 Epigastric pain: Secondary | ICD-10-CM | POA: Diagnosis not present

## 2023-08-26 DIAGNOSIS — R112 Nausea with vomiting, unspecified: Secondary | ICD-10-CM | POA: Diagnosis present

## 2023-08-26 DIAGNOSIS — F17210 Nicotine dependence, cigarettes, uncomplicated: Secondary | ICD-10-CM | POA: Diagnosis present

## 2023-08-26 DIAGNOSIS — Z825 Family history of asthma and other chronic lower respiratory diseases: Secondary | ICD-10-CM | POA: Diagnosis not present

## 2023-08-26 DIAGNOSIS — K860 Alcohol-induced chronic pancreatitis: Secondary | ICD-10-CM | POA: Diagnosis present

## 2023-08-26 DIAGNOSIS — F1010 Alcohol abuse, uncomplicated: Secondary | ICD-10-CM | POA: Diagnosis present

## 2023-08-26 DIAGNOSIS — K760 Fatty (change of) liver, not elsewhere classified: Secondary | ICD-10-CM | POA: Diagnosis present

## 2023-08-26 DIAGNOSIS — K852 Alcohol induced acute pancreatitis without necrosis or infection: Secondary | ICD-10-CM | POA: Diagnosis present

## 2023-08-26 DIAGNOSIS — R7401 Elevation of levels of liver transaminase levels: Secondary | ICD-10-CM | POA: Diagnosis not present

## 2023-08-26 DIAGNOSIS — Z85528 Personal history of other malignant neoplasm of kidney: Secondary | ICD-10-CM | POA: Diagnosis not present

## 2023-08-26 DIAGNOSIS — K219 Gastro-esophageal reflux disease without esophagitis: Secondary | ICD-10-CM | POA: Diagnosis present

## 2023-08-26 DIAGNOSIS — Z79899 Other long term (current) drug therapy: Secondary | ICD-10-CM | POA: Diagnosis not present

## 2023-08-26 DIAGNOSIS — Z7141 Alcohol abuse counseling and surveillance of alcoholic: Secondary | ICD-10-CM | POA: Diagnosis not present

## 2023-08-26 DIAGNOSIS — Z882 Allergy status to sulfonamides status: Secondary | ICD-10-CM | POA: Diagnosis not present

## 2023-08-26 DIAGNOSIS — Z801 Family history of malignant neoplasm of trachea, bronchus and lung: Secondary | ICD-10-CM | POA: Diagnosis not present

## 2023-08-26 LAB — COMPREHENSIVE METABOLIC PANEL WITH GFR
ALT: 34 U/L (ref 0–44)
AST: 41 U/L (ref 15–41)
Albumin: 3.6 g/dL (ref 3.5–5.0)
Alkaline Phosphatase: 61 U/L (ref 38–126)
Anion gap: 9 (ref 5–15)
BUN: 11 mg/dL (ref 8–23)
CO2: 24 mmol/L (ref 22–32)
Calcium: 9 mg/dL (ref 8.9–10.3)
Chloride: 105 mmol/L (ref 98–111)
Creatinine, Ser: 0.87 mg/dL (ref 0.61–1.24)
GFR, Estimated: >60 mL/min (ref >60)
Glucose, Bld: 114 mg/dL — ABNORMAL HIGH (ref 70–99)
Potassium: 3.4 mmol/L — ABNORMAL LOW (ref 3.5–5.1)
Sodium: 138 mmol/L (ref 135–145)
Total Bilirubin: 1.2 mg/dL (ref 0.0–1.2)
Total Protein: 7 g/dL (ref 6.5–8.1)

## 2023-08-26 LAB — CBC WITH DIFFERENTIAL/PLATELET
Abs Immature Granulocytes: 0.01 10*3/uL (ref 0.00–0.07)
Basophils Absolute: 0 10*3/uL (ref 0.0–0.1)
Basophils Relative: 1 %
Eosinophils Absolute: 0 10*3/uL (ref 0.0–0.5)
Eosinophils Relative: 0 %
HCT: 39.9 % (ref 39.0–52.0)
Hemoglobin: 14.2 g/dL (ref 13.0–17.0)
Immature Granulocytes: 0 %
Lymphocytes Relative: 32 %
Lymphs Abs: 2.1 10*3/uL (ref 0.7–4.0)
MCH: 34.4 pg — ABNORMAL HIGH (ref 26.0–34.0)
MCHC: 35.6 g/dL (ref 30.0–36.0)
MCV: 96.6 fL (ref 80.0–100.0)
Monocytes Absolute: 0.6 10*3/uL (ref 0.1–1.0)
Monocytes Relative: 9 %
Neutro Abs: 3.8 10*3/uL (ref 1.7–7.7)
Neutrophils Relative %: 58 %
Platelets: 207 10*3/uL (ref 150–400)
RBC: 4.13 MIL/uL — ABNORMAL LOW (ref 4.22–5.81)
RDW: 14.1 % (ref 11.5–15.5)
WBC: 6.4 10*3/uL (ref 4.0–10.5)
nRBC: 0 % (ref 0.0–0.2)

## 2023-08-26 LAB — PROTIME-INR
INR: 1.2 (ref 0.8–1.2)
Prothrombin Time: 15.5 s — ABNORMAL HIGH (ref 11.4–15.2)

## 2023-08-26 LAB — ETHANOL: Alcohol, Ethyl (B): <15 mg/dL (ref <15)

## 2023-08-26 LAB — LIPASE, BLOOD: Lipase: 176 U/L — ABNORMAL HIGH (ref 11–51)

## 2023-08-26 LAB — MAGNESIUM: Magnesium: 1.8 mg/dL (ref 1.7–2.4)

## 2023-08-26 LAB — PHOSPHORUS: Phosphorus: 3 mg/dL (ref 2.5–4.6)

## 2023-08-26 MED ORDER — PANTOPRAZOLE SODIUM 40 MG PO TBEC
40.0000 mg | DELAYED_RELEASE_TABLET | Freq: Two times a day (BID) | ORAL | Status: DC
  Administered 2023-08-26 – 2023-08-28 (×5): 40 mg via ORAL
  Filled 2023-08-26 (×5): qty 1

## 2023-08-26 MED ORDER — SENNOSIDES-DOCUSATE SODIUM 8.6-50 MG PO TABS: 1.0000 | ORAL_TABLET | Freq: Every evening | ORAL | Status: DC | PRN

## 2023-08-26 MED ORDER — IPRATROPIUM-ALBUTEROL 0.5-2.5 (3) MG/3ML IN SOLN: 3.0000 mL | RESPIRATORY_TRACT | Status: DC | PRN

## 2023-08-26 MED ORDER — THIAMINE MONONITRATE 100 MG PO TABS
100.0000 mg | ORAL_TABLET | Freq: Every day | ORAL | Status: DC
  Administered 2023-08-27 – 2023-08-28 (×2): 100 mg via ORAL
  Filled 2023-08-26 (×2): qty 1

## 2023-08-26 MED ORDER — OXYCODONE HCL 5 MG PO TABS
5.0000 mg | ORAL_TABLET | ORAL | Status: DC | PRN
  Administered 2023-08-26 – 2023-08-28 (×3): 5 mg via ORAL
  Filled 2023-08-26 (×3): qty 1

## 2023-08-26 MED ORDER — HYDRALAZINE HCL 20 MG/ML IJ SOLN: 10.0000 mg | INTRAMUSCULAR | Status: DC | PRN

## 2023-08-26 MED ORDER — POTASSIUM CHLORIDE 10 MEQ/100ML IV SOLN
10.0000 meq | INTRAVENOUS | Status: AC
  Administered 2023-08-26 (×4): 10 meq via INTRAVENOUS
  Filled 2023-08-26: qty 100

## 2023-08-26 MED ORDER — LACTATED RINGERS IV SOLN: INTRAVENOUS | Status: AC

## 2023-08-26 MED ORDER — METOPROLOL TARTRATE 5 MG/5ML IV SOLN: 5.0000 mg | INTRAVENOUS | Status: DC | PRN

## 2023-08-26 MED ORDER — GUAIFENESIN 100 MG/5ML PO LIQD: 5.0000 mL | ORAL | Status: DC | PRN

## 2023-08-26 NOTE — Progress Notes (Signed)
 PROGRESS NOTE    James [name]  FMW:[id number] DOB: 1960/07/01 DOA: 08/25/2023 PCP: Oley Bascom RAMAN, NP    Brief Narrative:  63 year old with history of chronic alcohol abuse, acute alcoholic pancreatitis, HTN admitted to [facility] for acute alcoholic pancreatitis with complaints of epigastric pain.  Upon admission found to have minimally elevated lipase, CT abdomen pelvis shows mild dilation in mid jejunum with possible enteritis.   Assessment & Plan:  Principal Problem:   Acute alcoholic pancreatitis Active Problems:   Transaminitis   Hypokalemia   Epigastric pain   Nausea & vomiting   Chronic alcohol use   Essential hypertension   Acute on chronic alcoholic pancreatitis Chronic alcohol abuse - In the setting of alcohol abuse, epigastric pain there is clinical evidence of likely acute alcoholic pancreatitis.  For now we will give patient IV fluids, diet as tolerated, PPI -Alcohol withdrawal protocol -Multivitamin, folic acid  and thiamine  -TOC consult for substance abuse  Hypokalemia - As needed repletion  Essential hypertension -Home p.o. medication on hold.  IV as needed ordered    DVT prophylaxis: SCDs Start: 08/25/23 2151    Code Status: Full Code Family Communication: Friend at bedside Hospital stay for nausea and vomiting    Subjective: Trouble tolerating clear liquid diet, causing abdominal pain   Examination:  General exam: Appears calm and comfortable  Respiratory system: Clear to auscultation. Respiratory effort normal. Cardiovascular system: S1 & S2 heard, RRR. No JVD, murmurs, rubs, gallops or clicks. No pedal edema. Gastrointestinal system: Abdomen is nondistended, soft and nontender. No organomegaly or masses felt. Normal bowel sounds heard. Central nervous system: Alert and oriented. No focal neurological deficits. Extremities: Symmetric 5 x 5 power. Skin: No rashes, lesions or ulcers Psychiatry: Judgement and insight appear  normal. Mood & affect appropriate.                Diet Orders (From admission, onward)     Start     Ordered   08/26/23 0806  Diet clear liquid Fluid consistency: Thin  Diet effective now       Question:  Fluid consistency:  Answer:  Thin   08/26/23 0806            Objective: Vitals:   08/26/23 0005 08/26/23 0105 08/26/23 0542 08/26/23 0946  BP: 137/75 (!) 142/90 (!) 140/75 (!) 141/91  Pulse: (!) 56 61 (!) 58 62  Resp:  18 18 18   Temp:  98.3 F (36.8 C) 97.9 F (36.6 C) 98.4 F (36.9 C)  TempSrc:  Oral Oral Oral  SpO2:  100% 100% 97%  Weight:      Height:        Intake/Output Summary (Last 24 hours) at 08/26/2023 1155 Last data filed at 08/26/2023 1000 Gross per 24 hour  Intake 1925.83 ml  Output 0 ml  Net 1925.83 ml   Filed Weights   08/25/23 2003  Weight: 94.3 kg    Scheduled Meds:  folic acid   1 mg Oral Daily   multivitamin with minerals  1 tablet Oral Daily   pantoprazole   40 mg Oral BID AC   [START ON 08/27/2023] thiamine   100 mg Oral Daily   Continuous Infusions:  lactated ringers  100 mL/hr at 08/26/23 0954   potassium chloride  10 mEq (08/26/23 1154)    Nutritional status     Body mass index is 26.71 kg/m.  Data Reviewed:   CBC: Recent Labs  Lab 08/25/23 1558 08/26/23 0504  WBC 7.5 6.4  NEUTROABS  --  3.8  HGB 16.9 14.2  HCT 45.8 39.9  MCV 93.3 96.6  PLT 270 207   Basic Metabolic Panel: Recent Labs  Lab 08/25/23 1558 08/26/23 0504  NA 136 138  K 3.2* 3.4*  CL 102 105  CO2 20* 24  GLUCOSE 142* 114*  BUN 11 11  CREATININE 0.98 0.87  CALCIUM 10.2 9.0  MG 1.9 1.8  PHOS  --  3.0   GFR: Estimated Creatinine Clearance: 101 mL/min (by C-G formula based on SCr of 0.87 mg/dL). Liver Function Tests: Recent Labs  Lab 08/25/23 1558 08/26/23 0504  AST 65* 41  ALT 49* 34  ALKPHOS 78 61  BILITOT 2.0* 1.2  PROT 8.8* 7.0  ALBUMIN 4.6 3.6   Recent Labs  Lab 08/25/23 1558 08/26/23 0504  LIPASE 145* 176*   No results  for input(s): AMMONIA in the last 168 hours. Coagulation Profile: Recent Labs  Lab 08/26/23 0730  INR 1.2   Cardiac Enzymes: No results for input(s): CKTOTAL, CKMB, CKMBINDEX, TROPONINI in the last 168 hours. BNP (last 3 results) No results for input(s): PROBNP in the last 8760 hours. HbA1C: No results for input(s): HGBA1C in the last 72 hours. CBG: No results for input(s): GLUCAP in the last 168 hours. Lipid Profile: No results for input(s): CHOL, HDL, LDLCALC, TRIG, CHOLHDL, LDLDIRECT in the last 72 hours. Thyroid Function Tests: No results for input(s): TSH, T4TOTAL, FREET4, T3FREE, THYROIDAB in the last 72 hours. Anemia Panel: No results for input(s): VITAMINB12, FOLATE, FERRITIN, TIBC, IRON, RETICCTPCT in the last 72 hours. Sepsis Labs: No results for input(s): PROCALCITON, LATICACIDVEN in the last 168 hours.  No results found for this or any previous visit (from the past 240 hours).       Radiology Studies: CT ABDOMEN PELVIS W CONTRAST Result Date: 08/25/2023 CLINICAL DATA:  Left-sided abdominal pain EXAM: CT ABDOMEN AND PELVIS WITH CONTRAST TECHNIQUE: Multidetector CT imaging of the abdomen and pelvis was performed using the standard protocol following bolus administration of intravenous contrast. RADIATION DOSE REDUCTION: This exam was performed according to the departmental dose-optimization program which includes automated exposure control, adjustment of the mA and/or kV according to patient size and/or use of iterative reconstruction technique. CONTRAST:  OMNIPAQUE  IOHEXOL  300 MG/ML  SOLN COMPARISON:  07/28/2017 FINDINGS: Lower chest: No acute abnormality. Hepatobiliary: Fatty infiltration of the liver is noted. The gallbladder is within normal limits. Pancreas: Unremarkable. No pancreatic ductal dilatation or surrounding inflammatory changes. Spleen: Normal in size without focal abnormality. Adrenals/Urinary  Tract: Adrenal glands are within normal limits. Some scarring is noted in the left kidney. No renal calculi or obstructive changes are seen. Bladder is well distended. Stomach/Bowel: Appendix is within normal limits. No obstructive or inflammatory changes of the colon are seen. Stomach is within normal limits. Mild dilatation in the mid jejunum is noted without definitive obstructing lesion. This may represent some mild enteritis. Vascular/Lymphatic: Aortic atherosclerosis. No enlarged abdominal or pelvic lymph nodes. Reproductive: Prostate is unremarkable. Other: No abdominal wall hernia or abnormality. No abdominopelvic ascites. Musculoskeletal: No acute or significant osseous findings. IMPRESSION: Mild dilatation in the mid jejunum which likely represents some enteritis. No true obstruction is seen. Fatty liver. Electronically Signed   By: Oneil Devonshire M.D.   On: 08/25/2023 20:16           LOS: 0 days   Time spent= 35 mins    Burgess JAYSON Dare, MD [facility]  If 7PM-7AM, please contact night-coverage  08/26/2023, 11:55 AM

## 2023-08-26 NOTE — Progress Notes (Signed)
 Pt has arrived to unit from the ED, via stretcher and nurse at side. Pt is warm, dry,no visible distress.

## 2023-08-26 NOTE — Hospital Course (Addendum)
 Brief Narrative:  63 year old with history of chronic alcohol abuse, acute alcoholic pancreatitis, HTN admitted to [facility] for acute alcoholic pancreatitis with complaints of epigastric pain.  Upon admission found to have minimally elevated lipase, CT abdomen pelvis shows mild dilation in mid jejunum with possible enteritis.   Assessment & Plan:  Principal Problem:   Acute alcoholic pancreatitis Active Problems:   Transaminitis   Hypokalemia   Epigastric pain   Nausea & vomiting   Chronic alcohol use   Essential hypertension   Acute on chronic alcoholic pancreatitis Chronic alcohol abuse - In the setting of alcohol abuse, epigastric pain there is clinical evidence of likely acute alcoholic pancreatitis.  For now we will give patient IV fluids, diet as tolerated, PPI -Alcohol withdrawal protocol -Multivitamin, folic acid  and thiamine  -TOC consult for substance abuse  Hypokalemia - As needed repletion  Essential hypertension -Home p.o. medication on hold.  IV as needed ordered    DVT prophylaxis: SCDs Start: 08/25/23 2151    Code Status: Full Code Family Communication: Friend at bedside Hospital stay for nausea and vomiting    Subjective: Trouble tolerating clear liquid diet, causing abdominal pain   Examination:  General exam: Appears calm and comfortable  Respiratory system: Clear to auscultation. Respiratory effort normal. Cardiovascular system: S1 & S2 heard, RRR. No JVD, murmurs, rubs, gallops or clicks. No pedal edema. Gastrointestinal system: Abdomen is nondistended, soft and nontender. No organomegaly or masses felt. Normal bowel sounds heard. Central nervous system: Alert and oriented. No focal neurological deficits. Extremities: Symmetric 5 x 5 power. Skin: No rashes, lesions or ulcers Psychiatry: Judgement and insight appear normal. Mood & affect appropriate.

## 2023-08-26 NOTE — Plan of Care (Signed)

## 2023-08-27 DIAGNOSIS — K852 Alcohol induced acute pancreatitis without necrosis or infection: Secondary | ICD-10-CM | POA: Diagnosis not present

## 2023-08-27 DIAGNOSIS — R1013 Epigastric pain: Secondary | ICD-10-CM | POA: Diagnosis not present

## 2023-08-27 DIAGNOSIS — F1090 Alcohol use, unspecified, uncomplicated: Secondary | ICD-10-CM | POA: Diagnosis not present

## 2023-08-27 LAB — COMPREHENSIVE METABOLIC PANEL WITH GFR
ALT: 31 U/L (ref 0–44)
AST: 39 U/L (ref 15–41)
Albumin: 3.6 g/dL (ref 3.5–5.0)
Alkaline Phosphatase: 59 U/L (ref 38–126)
Anion gap: 11 (ref 5–15)
BUN: 9 mg/dL (ref 8–23)
CO2: 20 mmol/L — ABNORMAL LOW (ref 22–32)
Calcium: 8.8 mg/dL — ABNORMAL LOW (ref 8.9–10.3)
Chloride: 106 mmol/L (ref 98–111)
Creatinine, Ser: 0.77 mg/dL (ref 0.61–1.24)
GFR, Estimated: >60 mL/min (ref >60)
Glucose, Bld: 89 mg/dL (ref 70–99)
Potassium: 3.3 mmol/L — ABNORMAL LOW (ref 3.5–5.1)
Sodium: 137 mmol/L (ref 135–145)
Total Bilirubin: 1.5 mg/dL — ABNORMAL HIGH (ref 0.0–1.2)
Total Protein: 6.9 g/dL (ref 6.5–8.1)

## 2023-08-27 LAB — CBC
HCT: 39.4 % (ref 39.0–52.0)
Hemoglobin: 14.2 g/dL (ref 13.0–17.0)
MCH: 34.7 pg — ABNORMAL HIGH (ref 26.0–34.0)
MCHC: 36 g/dL (ref 30.0–36.0)
MCV: 96.3 fL (ref 80.0–100.0)
Platelets: 181 10*3/uL (ref 150–400)
RBC: 4.09 MIL/uL — ABNORMAL LOW (ref 4.22–5.81)
RDW: 13.3 % (ref 11.5–15.5)
WBC: 6.4 10*3/uL (ref 4.0–10.5)
nRBC: 0 % (ref 0.0–0.2)

## 2023-08-27 LAB — MAGNESIUM: Magnesium: 1.7 mg/dL (ref 1.7–2.4)

## 2023-08-27 MED ORDER — POTASSIUM CHLORIDE CRYS ER 20 MEQ PO TBCR
40.0000 meq | EXTENDED_RELEASE_TABLET | Freq: Once | ORAL | Status: AC
  Administered 2023-08-27: 40 meq via ORAL
  Filled 2023-08-27: qty 2

## 2023-08-27 NOTE — Progress Notes (Signed)
   08/27/23 1420  TOC Brief Assessment  Insurance and Status Reviewed  Patient has primary care physician Yes  Home environment has been reviewed home with mother  Prior level of function: independent  Prior/Current Home Services No current home services  Social Drivers of Health Review SDOH reviewed no interventions necessary  Readmission risk has been reviewed Yes  Transition of care needs no transition of care needs at this time   Received referral for [facility] for SA concerns.  Met with pt who acknowledges that his hospitalization and medical issues are result of ETOH abuse.  Denies concerns about handling cessation on his own at dc cause I don't never want to feel like this again.  Does confirm that he has received SA treatment  along time ago.  He is agreeable for CSW to add local SA treatment resources to his AVS.   No further TOC needs noted.  Will sign off.  Please reorder if needed.

## 2023-08-27 NOTE — Plan of Care (Signed)

## 2023-08-27 NOTE — Progress Notes (Signed)
 [facility]  PROGRESS NOTE  James [name] FMW:[id number] DOB: 29-Jul-1960 DOA: 08/25/2023 PCP: Oley Bascom RAMAN, NP   Brief HPI:   63 year old with history of chronic alcohol abuse, acute alcoholic pancreatitis, HTN admitted to [facility] for acute alcoholic pancreatitis with complaints of epigastric pain. Upon admission found to have minimally elevated lipase, CT abdomen pelvis shows mild dilation in mid jejunum with possible enteritis.     Assessment/Plan:   Acute on chronic alcoholic pancreatitis Chronic alcohol abuse - In the setting of alcohol abuse, epigastric pain there is clinical evidence of likely acute alcoholic pancreatitis.  For now we will give patient IV fluids, diet as tolerated, PPI -Alcohol withdrawal protocol -Multivitamin, folic acid  and thiamine  -TOC consult for substance abuse   Hypokalemia - Replace potassium and follow BMP in am   Essential hypertension -Home p.o. medication on hold.  IV as needed ordered    Medications     folic acid   1 mg Oral Daily   multivitamin with minerals  1 tablet Oral Daily   pantoprazole   40 mg Oral BID AC   thiamine   100 mg Oral Daily     Data Reviewed:   CBG:  No results for input(s): GLUCAP in the last 168 hours.  SpO2: 99 %    Vitals:   08/26/23 1837 08/26/23 2032 08/27/23 0012 08/27/23 0615  BP: (!) 156/97 (!) 156/98  (!) 140/72  Pulse: 69 (!) 49 (!) 49 63  Resp:  18  18  Temp:  97.8 F (36.6 C)  97.8 F (36.6 C)  TempSrc:  Oral  Oral  SpO2: 100% 99%  99%  Weight:      Height:          Data Reviewed:  Basic Metabolic Panel: Recent Labs  Lab 08/25/23 1558 08/26/23 0504 08/27/23 0453  NA 136 138 137  K 3.2* 3.4* 3.3*  CL 102 105 106  CO2 20* 24 20*  GLUCOSE 142* 114* 89  BUN 11 11 9   CREATININE 0.98 0.87 0.77  CALCIUM 10.2 9.0 8.8*  MG 1.9 1.8 1.7  PHOS  --  3.0  --     CBC: Recent Labs  Lab 08/25/23 1558 08/26/23 0504 08/27/23 0453  WBC 7.5 6.4 6.4   NEUTROABS  --  3.8  --   HGB 16.9 14.2 14.2  HCT 45.8 39.9 39.4  MCV 93.3 96.6 96.3  PLT 270 207 181    LFT Recent Labs  Lab 08/25/23 1558 08/26/23 0504 08/27/23 0453  AST 65* 41 39  ALT 49* 34 31  ALKPHOS 78 61 59  BILITOT 2.0* 1.2 1.5*  PROT 8.8* 7.0 6.9  ALBUMIN 4.6 3.6 3.6     Antibiotics: Anti-infectives (From admission, onward)    None        DVT prophylaxis: SCDs  Code Status: Full code  Family Communication: No family at bedside   CONSULTS    Subjective   Still has abdominal pain   Objective    Physical Examination:   General-appears in no acute distress Heart-S1-S2, regular, no murmur auscultated Lungs-clear to auscultation bilaterally, no wheezing or crackles auscultated Abdomen-soft, positive tenderness to palpation in epigastric region Extremities-no edema in the lower extremities Neuro-alert, oriented x3, no focal deficit noted   Status is: Inpatient:             James [name]   [facility] If 7PM-7AM, please contact night-coverage at [email or web address], Office  [phone]   08/27/2023, 7:48 AM  LOS:  1 day

## 2023-08-28 ENCOUNTER — Other Ambulatory Visit (HOSPITAL_COMMUNITY): Payer: Self-pay

## 2023-08-28 ENCOUNTER — Other Ambulatory Visit: Payer: Self-pay

## 2023-08-28 LAB — COMPREHENSIVE METABOLIC PANEL WITH GFR
ALT: 27 U/L (ref 0–44)
AST: 33 U/L (ref 15–41)
Albumin: 3.4 g/dL — ABNORMAL LOW (ref 3.5–5.0)
Alkaline Phosphatase: 57 U/L (ref 38–126)
Anion gap: 6 (ref 5–15)
BUN: 7 mg/dL — ABNORMAL LOW (ref 8–23)
CO2: 23 mmol/L (ref 22–32)
Calcium: 8.8 mg/dL — ABNORMAL LOW (ref 8.9–10.3)
Chloride: 106 mmol/L (ref 98–111)
Creatinine, Ser: 1.04 mg/dL (ref 0.61–1.24)
GFR, Estimated: >60 mL/min (ref >60)
Glucose, Bld: 111 mg/dL — ABNORMAL HIGH (ref 70–99)
Potassium: 3.5 mmol/L (ref 3.5–5.1)
Sodium: 135 mmol/L (ref 135–145)
Total Bilirubin: 1.3 mg/dL — ABNORMAL HIGH (ref 0.0–1.2)
Total Protein: 6.9 g/dL (ref 6.5–8.1)

## 2023-08-28 LAB — MAGNESIUM: Magnesium: 1.8 mg/dL (ref 1.7–2.4)

## 2023-08-28 LAB — CBC
HCT: 39 % (ref 39.0–52.0)
Hemoglobin: 13.9 g/dL (ref 13.0–17.0)
MCH: 34.3 pg — ABNORMAL HIGH (ref 26.0–34.0)
MCHC: 35.6 g/dL (ref 30.0–36.0)
MCV: 96.3 fL (ref 80.0–100.0)
Platelets: 180 10*3/uL (ref 150–400)
RBC: 4.05 MIL/uL — ABNORMAL LOW (ref 4.22–5.81)
RDW: 13.4 % (ref 11.5–15.5)
WBC: 4.8 10*3/uL (ref 4.0–10.5)
nRBC: 0 % (ref 0.0–0.2)

## 2023-08-28 MED ORDER — PANCRELIPASE (LIP-PROT-AMYL) 12000-38000 UNITS PO CPEP
12000.0000 [IU] | ORAL_CAPSULE | Freq: Three times a day (TID) | ORAL | Status: DC
  Administered 2023-08-28: 12000 [IU] via ORAL
  Filled 2023-08-28: qty 1

## 2023-08-28 MED ORDER — VITAMIN B-1 100 MG PO TABS
100.0000 mg | ORAL_TABLET | Freq: Every day | ORAL | 0 refills | Status: DC
  Filled 2023-08-28: qty 30, 30d supply, fill #0

## 2023-08-28 MED ORDER — AMLODIPINE BESYLATE 5 MG PO TABS
5.0000 mg | ORAL_TABLET | Freq: Every day | ORAL | 3 refills | Status: DC
Start: 2023-08-28 — End: 2023-12-10
  Filled 2023-08-28: qty 30, 30d supply, fill #0
  Filled 2023-09-18: qty 30, 30d supply, fill #1
  Filled 2023-10-22: qty 30, 30d supply, fill #2

## 2023-08-28 MED ORDER — PANTOPRAZOLE SODIUM 40 MG PO TBEC
40.0000 mg | DELAYED_RELEASE_TABLET | Freq: Every day | ORAL | 1 refills | Status: DC
  Filled 2023-08-28: qty 30, 30d supply, fill #0

## 2023-08-28 MED ORDER — PANCRELIPASE (LIP-PROT-AMYL) 12000-38000 UNITS PO CPEP
12000.0000 [IU] | ORAL_CAPSULE | Freq: Three times a day (TID) | ORAL | 1 refills | Status: DC
  Filled 2023-08-28: qty 100, 30d supply, fill #0

## 2023-08-28 MED ORDER — OXYCODONE HCL 5 MG PO TABS
5.0000 mg | ORAL_TABLET | Freq: Four times a day (QID) | ORAL | 0 refills | Status: DC | PRN
  Filled 2023-08-28: qty 15, 4d supply, fill #0

## 2023-08-28 NOTE — Progress Notes (Signed)
 Discharge instructions reviewed with patient, all questions were answered, medications were received, patient taken to main entranced via wheelchair.

## 2023-08-28 NOTE — Plan of Care (Signed)

## 2023-08-28 NOTE — Discharge Summary (Signed)
 Physician Discharge Summary   Patient: James [name] MRN: [id number] DOB: 1961-01-05  Admit date:     08/25/2023  Discharge date: 08/28/23  Discharge Physician: Sabas GORMAN Brod   PCP: Oley Bascom GORMAN, NP   Recommendations at discharge:   Follow-up PCP in 1 week  Discharge Diagnoses: Principal Problem:   Acute alcoholic pancreatitis Active Problems:   Transaminitis   Acute pancreatitis   Hypokalemia   Epigastric pain   Nausea & vomiting   Chronic alcohol use   Essential hypertension  Resolved Problems:   * No resolved hospital problems. [facility] Course:  63 year old with history of chronic alcohol abuse, acute alcoholic pancreatitis, HTN admitted to [facility] for acute alcoholic pancreatitis with complaints of epigastric pain. Upon admission found to have minimally elevated lipase, CT abdomen pelvis shows mild dilation in mid jejunum with possible enteritis.    Assessment & Plan:    Acute on chronic alcoholic pancreatitis Chronic alcohol abuse - In the setting of alcohol abuse, epigastric pain there is clinical evidence of likely acute alcoholic pancreatitis.  - Abdominal pain has resolved. -Will discharge on Protonix  40 mg p.o. daily, Creon pancreatic enzymes 3 times daily -Counseling for alcohol cessation provided, patient given local substance abuse treatment resources by social work   Hypokalemia - Replete   Essential hypertension Discontinue HCTZ as it can cause pancreatitis -Start amlodipine 5 mg daily           Consultants:  Procedures performed:  Disposition: Home Diet recommendation:  Discharge Diet Orders (From admission, onward)     Start     Ordered   08/28/23 0000  Diet - low sodium heart healthy        08/28/23 1402           Regular diet DISCHARGE MEDICATION: Allergies as of 08/28/2023       Reactions   Sulfa Antibiotics Swelling        Medication List     STOP taking these medications    esomeprazole 20  MG capsule Commonly known as: NEXIUM   hydrochlorothiazide  12.5 MG capsule Commonly known as: MICROZIDE    predniSONE  10 MG tablet Commonly known as: DELTASONE        TAKE these medications    allopurinol  100 MG tablet Commonly known as: ZYLOPRIM  Take 1 tablet (100 mg total) by mouth daily.   amLODipine 5 MG tablet Commonly known as: NORVASC Take 1 tablet (5 mg total) by mouth daily.   lipase/protease/amylase 12000-38000 units Cpep capsule Commonly known as: CREON Take 1 capsule (12,000 Units total) by mouth 3 (three) times daily before meals.   oxyCODONE  5 MG immediate release tablet Commonly known as: Oxy IR/ROXICODONE  Take 1 tablet (5 mg total) by mouth every 6 (six) hours as needed for moderate pain (pain score 4-6) or severe pain (pain score 7-10).   pantoprazole  40 MG tablet Commonly known as: PROTONIX  Take 1 tablet (40 mg total) by mouth daily.   thiamine  100 MG tablet Commonly known as: Vitamin B-1 Take 1 tablet (100 mg total) by mouth daily. Start taking on: August 29, 2023        Discharge Exam: Fredricka Weights   08/25/23 2003  Weight: 94.3 kg   General-appears in no acute distress Heart-S1-S2, regular, no murmur auscultated Lungs-clear to auscultation bilaterally, no wheezing or crackles auscultated Abdomen-soft, nontender, no organomegaly Extremities-no edema in the lower extremities Neuro-alert, oriented x3, no focal deficit noted  Condition at discharge: good  The results of significant  diagnostics from this hospitalization (including imaging, microbiology, ancillary and laboratory) are listed below for reference.   Imaging Studies: CT ABDOMEN PELVIS W CONTRAST Result Date: 08/25/2023 CLINICAL DATA:  Left-sided abdominal pain EXAM: CT ABDOMEN AND PELVIS WITH CONTRAST TECHNIQUE: Multidetector CT imaging of the abdomen and pelvis was performed using the standard protocol following bolus administration of intravenous contrast. RADIATION DOSE REDUCTION:  This exam was performed according to the departmental dose-optimization program which includes automated exposure control, adjustment of the mA and/or kV according to patient size and/or use of iterative reconstruction technique. CONTRAST:  OMNIPAQUE  IOHEXOL  300 MG/ML  SOLN COMPARISON:  07/28/2017 FINDINGS: Lower chest: No acute abnormality. Hepatobiliary: Fatty infiltration of the liver is noted. The gallbladder is within normal limits. Pancreas: Unremarkable. No pancreatic ductal dilatation or surrounding inflammatory changes. Spleen: Normal in size without focal abnormality. Adrenals/Urinary Tract: Adrenal glands are within normal limits. Some scarring is noted in the left kidney. No renal calculi or obstructive changes are seen. Bladder is well distended. Stomach/Bowel: Appendix is within normal limits. No obstructive or inflammatory changes of the colon are seen. Stomach is within normal limits. Mild dilatation in the mid jejunum is noted without definitive obstructing lesion. This may represent some mild enteritis. Vascular/Lymphatic: Aortic atherosclerosis. No enlarged abdominal or pelvic lymph nodes. Reproductive: Prostate is unremarkable. Other: No abdominal wall hernia or abnormality. No abdominopelvic ascites. Musculoskeletal: No acute or significant osseous findings. IMPRESSION: Mild dilatation in the mid jejunum which likely represents some enteritis. No true obstruction is seen. Fatty liver. Electronically Signed   By: Oneil Devonshire M.D.   On: 08/25/2023 20:16    Microbiology: Results for orders placed or performed during the hospital encounter of 07/14/12  Blood culture (routine x 2)     Status: None   Collection Time: 07/14/12 11:00 AM   Specimen: Blood  Result Value Ref Range Status   Specimen Description BLOOD LEFT ANTECUBITAL  Final   Special Requests BOTTLES DRAWN AEROBIC AND ANAEROBIC 2.5CC  Final   Culture  Setup Time 07/14/2012 14:24  Final   Culture NO GROWTH 5 DAYS  Final    Report Status 07/20/2012 FINAL  Final  Blood culture (routine x 2)     Status: None   Collection Time: 07/14/12 11:23 AM   Specimen: Peripheral; Blood  Result Value Ref Range Status   Specimen Description BLOOD RIGHT ANTECUBITAL  Final   Special Requests BOTTLES DRAWN AEROBIC AND ANAEROBIC 3CC  Final   Culture  Setup Time 07/14/2012 14:24  Final   Culture NO GROWTH 5 DAYS  Final   Report Status 07/20/2012 FINAL  Final    Labs: CBC: Recent Labs  Lab 08/25/23 1558 08/26/23 0504 08/27/23 0453 08/28/23 0448  WBC 7.5 6.4 6.4 4.8  NEUTROABS  --  3.8  --   --   HGB 16.9 14.2 14.2 13.9  HCT 45.8 39.9 39.4 39.0  MCV 93.3 96.6 96.3 96.3  PLT 270 207 181 180   Basic Metabolic Panel: Recent Labs  Lab 08/25/23 1558 08/26/23 0504 08/27/23 0453 08/28/23 0448  NA 136 138 137 135  K 3.2* 3.4* 3.3* 3.5  CL 102 105 106 106  CO2 20* 24 20* 23  GLUCOSE 142* 114* 89 111*  BUN 11 11 9  7*  CREATININE 0.98 0.87 0.77 1.04  CALCIUM 10.2 9.0 8.8* 8.8*  MG 1.9 1.8 1.7 1.8  PHOS  --  3.0  --   --    Liver Function Tests: Recent Labs  Lab 08/25/23  1558 08/26/23 0504 08/27/23 0453 08/28/23 0448  AST 65* 41 39 33  ALT 49* 34 31 27  ALKPHOS 78 61 59 57  BILITOT 2.0* 1.2 1.5* 1.3*  PROT 8.8* 7.0 6.9 6.9  ALBUMIN 4.6 3.6 3.6 3.4*   CBG: No results for input(s): GLUCAP in the last 168 hours.  Discharge time spent: greater than 30 minutes.  Signed: Sabas GORMAN Brod, MD [facility] 08/28/2023

## 2023-08-28 NOTE — Progress Notes (Incomplete)
 [facility]  PROGRESS NOTE  James [name]:[id number] DOB: [birth year] DOA: 08/25/2023 PCP: Oley Bascom RAMAN, NP   Brief HPI:   63 year old with history of chronic alcohol abuse, acute alcoholic pancreatitis, HTN admitted to [facility] for acute alcoholic pancreatitis with complaints of epigastric pain. Upon admission found to have minimally elevated lipase, CT abdomen pelvis shows mild dilation in mid jejunum with possible enteritis.     Assessment/Plan:   Acute on chronic alcoholic pancreatitis Chronic alcohol abuse - In the setting of alcohol abuse, epigastric pain there is clinical evidence of likely acute alcoholic pancreatitis.  For now we will give patient IV fluids, diet as tolerated, PPI -Alcohol withdrawal protocol -Multivitamin, folic acid  and thiamine  -TOC consult for substance abuse   Hypokalemia - Replace potassium and follow BMP in am   Essential hypertension -Home p.o. medication on hold.  IV as needed ordered    Medications     folic acid   1 mg Oral Daily   multivitamin with minerals  1 tablet Oral Daily   pantoprazole   40 mg Oral BID AC   thiamine   100 mg Oral Daily     Data Reviewed:   CBG:  No results for input(s): GLUCAP in the last 168 hours.  SpO2: 99 %    Vitals:   08/27/23 1200 08/27/23 1446 08/27/23 2046 08/28/23 0521  BP: (!) 140/64 (!) 148/90 138/82 (!) 148/87  Pulse: (!) 48 76 84 61  Resp:  15 18 18   Temp:  97.6 F (36.4 C) 98.3 F (36.8 C) 98.1 F (36.7 C)  TempSrc:   Oral Oral  SpO2:  100% 98% 99%  Weight:      Height:          Data Reviewed:  Basic Metabolic Panel: Recent Labs  Lab 08/25/23 1558 08/26/23 0504 08/27/23 0453 08/28/23 0448  NA 136 138 137 135  K 3.2* 3.4* 3.3* 3.5  CL 102 105 106 106  CO2 20* 24 20* 23  GLUCOSE 142* 114* 89 111*  BUN 11 11 9  7*  CREATININE 0.98 0.87 0.77 1.04  CALCIUM 10.2 9.0 8.8* 8.8*  MG 1.9 1.8 1.7 1.8  PHOS  --  3.0  --   --     CBC: Recent Labs   Lab 08/25/23 1558 08/26/23 0504 08/27/23 0453 08/28/23 0448  WBC 7.5 6.4 6.4 4.8  NEUTROABS  --  3.8  --   --   HGB 16.9 14.2 14.2 13.9  HCT 45.8 39.9 39.4 39.0  MCV 93.3 96.6 96.3 96.3  PLT 270 207 181 180    LFT Recent Labs  Lab 08/25/23 1558 08/26/23 0504 08/27/23 0453 08/28/23 0448  AST 65* 41 39 33  ALT 49* 34 31 27  ALKPHOS 78 61 59 57  BILITOT 2.0* 1.2 1.5* 1.3*  PROT 8.8* 7.0 6.9 6.9  ALBUMIN 4.6 3.6 3.6 3.4*     Antibiotics: Anti-infectives (From admission, onward)    None        DVT prophylaxis: SCDs  Code Status: Full code  Family Communication: No family at bedside   CONSULTS    Subjective      Objective    Physical Examination:      Status is: Inpatient:             James [name]   [facility] If 7PM-7AM, please contact night-coverage at [email or web address], Office  [phone]   08/28/2023, 8:02 AM  LOS: 2 days

## 2023-08-30 ENCOUNTER — Encounter (HOSPITAL_COMMUNITY): Payer: Self-pay | Admitting: *Deleted

## 2023-08-30 ENCOUNTER — Emergency Department (HOSPITAL_COMMUNITY)

## 2023-08-30 ENCOUNTER — Other Ambulatory Visit: Payer: Self-pay

## 2023-08-30 ENCOUNTER — Emergency Department (HOSPITAL_COMMUNITY)
Admission: EM | Admit: 2023-08-30 | Discharge: 2023-08-30 | Disposition: A | Discharge status: Home / Self Care | Attending: Emergency Medicine | Admitting: Emergency Medicine

## 2023-08-30 DIAGNOSIS — E876 Hypokalemia: Secondary | ICD-10-CM | POA: Insufficient documentation

## 2023-08-30 DIAGNOSIS — M7989 Other specified soft tissue disorders: Secondary | ICD-10-CM

## 2023-08-30 DIAGNOSIS — I82442 Acute embolism and thrombosis of left tibial vein: Secondary | ICD-10-CM | POA: Diagnosis not present

## 2023-08-30 DIAGNOSIS — L03116 Cellulitis of left lower limb: Secondary | ICD-10-CM

## 2023-08-30 DIAGNOSIS — I82452 Acute embolism and thrombosis of left peroneal vein: Secondary | ICD-10-CM | POA: Insufficient documentation

## 2023-08-30 DIAGNOSIS — M79605 Pain in left leg: Secondary | ICD-10-CM | POA: Diagnosis present

## 2023-08-30 LAB — CBC WITH DIFFERENTIAL/PLATELET
Abs Immature Granulocytes: 0.02 K/uL (ref 0.00–0.07)
Basophils Absolute: 0 K/uL (ref 0.0–0.1)
Basophils Relative: 1 %
Eosinophils Absolute: 0.2 K/uL (ref 0.0–0.5)
Eosinophils Relative: 2 %
HCT: 38.6 % — ABNORMAL LOW (ref 39.0–52.0)
Hemoglobin: 13.5 g/dL (ref 13.0–17.0)
Immature Granulocytes: 0 %
Lymphocytes Relative: 19 %
Lymphs Abs: 1.2 K/uL (ref 0.7–4.0)
MCH: 34.5 pg — ABNORMAL HIGH (ref 26.0–34.0)
MCHC: 35 g/dL (ref 30.0–36.0)
MCV: 98.7 fL (ref 80.0–100.0)
Monocytes Absolute: 0.8 K/uL (ref 0.1–1.0)
Monocytes Relative: 13 %
Neutro Abs: 4.1 K/uL (ref 1.7–7.7)
Neutrophils Relative %: 65 %
Platelets: 202 K/uL (ref 150–400)
RBC: 3.91 MIL/uL — ABNORMAL LOW (ref 4.22–5.81)
RDW: 13.2 % (ref 11.5–15.5)
WBC: 6.3 K/uL (ref 4.0–10.5)
nRBC: 0 % (ref 0.0–0.2)

## 2023-08-30 LAB — BASIC METABOLIC PANEL WITH GFR
Anion gap: 14 (ref 5–15)
BUN: 11 mg/dL (ref 8–23)
CO2: 21 mmol/L — ABNORMAL LOW (ref 22–32)
Calcium: 9.1 mg/dL (ref 8.9–10.3)
Chloride: 100 mmol/L (ref 98–111)
Creatinine, Ser: 1.12 mg/dL (ref 0.61–1.24)
GFR, Estimated: >60 mL/min (ref >60)
Glucose, Bld: 120 mg/dL — ABNORMAL HIGH (ref 70–99)
Potassium: 3.2 mmol/L — ABNORMAL LOW (ref 3.5–5.1)
Sodium: 135 mmol/L (ref 135–145)

## 2023-08-30 MED ORDER — OXYCODONE-ACETAMINOPHEN 5-325 MG PO TABS
1.0000 | ORAL_TABLET | Freq: Once | ORAL | Status: AC
  Administered 2023-08-30: 1 via ORAL
  Filled 2023-08-30: qty 1

## 2023-08-30 MED ORDER — ACETAMINOPHEN ER 650 MG PO TBCR
650.0000 mg | EXTENDED_RELEASE_TABLET | Freq: Three times a day (TID) | ORAL | 0 refills | Status: DC | PRN
Start: 2023-08-30 — End: 2023-12-10

## 2023-08-30 MED ORDER — CEPHALEXIN 500 MG PO CAPS: 500.0000 mg | ORAL_CAPSULE | Freq: Four times a day (QID) | ORAL | 0 refills | Status: DC

## 2023-08-30 MED ORDER — RIVAROXABAN (XARELTO) VTE STARTER PACK (15 & 20 MG): ORAL_TABLET | ORAL | 0 refills | Status: DC

## 2023-08-30 MED ORDER — POTASSIUM CHLORIDE CRYS ER 20 MEQ PO TBCR
40.0000 meq | EXTENDED_RELEASE_TABLET | Freq: Once | ORAL | Status: AC
  Administered 2023-08-30: 40 meq via ORAL
  Filled 2023-08-30: qty 2

## 2023-08-30 MED ORDER — RIVAROXABAN 15 MG PO TABS
15.0000 mg | ORAL_TABLET | Freq: Once | ORAL | Status: AC
  Administered 2023-08-30: 15 mg via ORAL
  Filled 2023-08-30: qty 1

## 2023-08-30 NOTE — ED Provider Notes (Signed)
  Physical Exam  BP 136/82 (BP Location: Left Arm)   Pulse 94   Temp 99.2 F (37.3 C) (Oral)   Resp 17   Wt 94.3 kg   SpO2 96%   BMI 26.71 kg/m   Physical Exam  Procedures  Procedures  ED Course / MDM   Clinical Course as of 08/30/23 1822  Sat Aug 30, 2023  1251 Dc from [place] [facility] on 7/3: Acute on chronic alcoholic pancreatitis Chronic alcohol abuse - In the setting of alcohol abuse, epigastric pain there is clinical evidence of likely acute alcoholic pancreatitis.  - Abdominal pain has resolved. -Will discharge on Protonix  40 mg p.o. daily, Creon  pancreatic enzymes 3 times daily -Counseling for alcohol cessation provided, patient given local substance abuse treatment resources by social work   Hypokalemia - Replete   Essential hypertension Discontinue HCTZ as it can cause pancreatitis -Start amlodipine  5 mg daily  [TY]  1433 CBC with Differential(!) No leukocytosis to suggest systemic infection.  No anemia [TY]  1434 Basic metabolic panel(!) Mildly low potassium.  Will replete. [TY]    Clinical Course User Index [TY] Neysa Caron PARAS, DO   Medical Decision Making Amount and/or Complexity of Data Reviewed Labs: ordered. Decision-making details documented in ED Course. Radiology: ordered.  Risk OTC drugs. Prescription drug management.   Assuming care of patient from Dr. Neysa.  Patient in the ER for left lower extremity swelling and pain.  Workup thus far shows findings that are concerning for cellulitis clinically.  Ultrasound DVT pending.  Patient has history of alcohol use disorder, previous history of thrombosis of saphenous vein.  Concerning findings are as following -none Important pending results are ultrasound DVT  According to Dr. Neysa, plan is to discharge the patient once ultrasound is completed.  Patient had no complains, no concerns from the nursing side. Will continue to monitor.  6:22 PM Ultrasound is positive for DVT.  Also, per  ultrasound tech, patient had some lymphedema over the left side and nonspecific swelling in the popliteal region.  There was some cellulitis type finding as well.  I discussed with the patient that we will treat him as cellulitis and also put him on anticoagulation.  He will need to follow-up with his PCP in 10 to 14 days.  If his symptoms get worse, then he will need to come to the ER.  If his symptoms are not improving, specifically the redness, then the PCP can do further studies.       Charlyn Sora, MD 08/30/23 [phone]

## 2023-08-30 NOTE — ED Notes (Signed)
 Moved to room FT 6 for US . US  in progress.

## 2023-08-30 NOTE — ED Triage Notes (Signed)
 Recent admission for appendicitis. D/c'd 2d ago. Since has developed and return of his recurrent L ankle pain, swelling, redness and heat. H/o similar 3 months ago, finished ABT at that time. H/o LLE chronic wounds with delayed healing and cellulitis.

## 2023-08-30 NOTE — ED Provider Notes (Signed)
 [place] EMERGENCY DEPARTMENT AT [facility] Provider Note   CSN: [id number] Arrival date & time: 08/30/23  1217     Patient presents with: Ankle Pain   James [name] is a 63 y.o. male.   63 year old with left lower leg pain x 2 days.  Recently discharged from the hospital.  Reports swelling and pain to knee and below.  No fevers no chills.   Ankle Pain      Prior to Admission medications   Medication Sig Start Date End Date Taking? Authorizing Provider  allopurinol  (ZYLOPRIM ) 100 MG tablet Take 1 tablet (100 mg total) by mouth daily. 07/28/23   Oley Bascom RAMAN, NP  amLODipine  (NORVASC ) 5 MG tablet Take 1 tablet (5 mg total) by mouth daily. 08/28/23 08/27/24  Drusilla [name], MD  lipase/protease/amylase (CREON ) 12000-38000 units CPEP capsule Take 1 capsule (12,000 Units total) by mouth 3 (three) times daily before meals. 08/28/23   Drusilla [name], MD  oxyCODONE  (OXY IR/ROXICODONE ) 5 MG immediate release tablet Take 1 tablet (5 mg total) by mouth every 6 (six) hours as needed for moderate pain (pain score 4-6) or severe pain (pain score 7-10). 08/28/23   Drusilla [name], MD  pantoprazole  (PROTONIX ) 40 MG tablet Take 1 tablet (40 mg total) by mouth daily. 08/28/23   Drusilla [name], MD  thiamine  (VITAMIN B-1) 100 MG tablet Take 1 tablet (100 mg total) by mouth daily. 08/29/23   Drusilla [name], MD    Allergies: Sulfa antibiotics    Review of Systems  Updated Vital Signs BP 136/82 (BP Location: Left Arm)   Pulse 94   Temp 99.2 F (37.3 C) (Oral)   Resp 17   Wt 94.3 kg   SpO2 96%   BMI 26.71 kg/m   Physical Exam  (all labs ordered are listed, but only abnormal results are displayed) Labs Reviewed  CBC WITH DIFFERENTIAL/PLATELET - Abnormal; Notable for the following components:      Result Value   RBC 3.91 (*)    HCT 38.6 (*)    MCH 34.5 (*)    All other components within normal limits  BASIC METABOLIC PANEL WITH GFR - Abnormal; Notable for the following components:    Potassium 3.2 (*)    CO2 21 (*)    Glucose, Bld 120 (*)    All other components within normal limits    EKG: None  Radiology: DG Knee 2 Views Left Result Date: 08/30/2023 CLINICAL DATA:  Knee pain EXAM: LEFT KNEE - 1-2 VIEW COMPARISON:  None Available. FINDINGS: No fracture of the proximal tibia or distal femur. Patella is normal. No joint effusion. IMPRESSION: No fracture or dislocation. Electronically Signed   By: Jackquline Boxer M.D.   On: 08/30/2023 13:51   DG Ankle 2 Views Left Result Date: 08/30/2023 CLINICAL DATA:  Swelling. Admitted for appendicitis. Recurrent LEFT ankle pain EXAM: LEFT ANKLE - 2 VIEW COMPARISON:  None Available. FINDINGS: Ankle mortise intact. The talar dome is normal. No malleolar fracture. The calcaneus is normal. Mild soft tissue swelling over the medial and lateral malleolus. IMPRESSION: 1. No fracture or dislocation. 2. Mild soft tissue swelling. Electronically Signed   By: Jackquline Boxer M.D.   On: 08/30/2023 13:50     Procedures   Medications Ordered in the ED  potassium chloride  SA (KLOR-CON  M) CR tablet 40 mEq (40 mEq Oral Given 08/30/23 1513)  oxyCODONE -acetaminophen  (PERCOCET/ROXICET) 5-325 MG per tablet 1 tablet (1 tablet Oral Given 08/30/23 1513)  Clinical Course as of 08/30/23 1533  Sat Aug 30, 2023  1251 Dc from [place] [facility] on 7/3: Acute on chronic alcoholic pancreatitis Chronic alcohol abuse - In the setting of alcohol abuse, epigastric pain there is clinical evidence of likely acute alcoholic pancreatitis.  - Abdominal pain has resolved. -Will discharge on Protonix  40 mg p.o. daily, Creon  pancreatic enzymes 3 times daily -Counseling for alcohol cessation provided, patient given local substance abuse treatment resources by social work   Hypokalemia - Replete   Essential hypertension Discontinue HCTZ as it can cause pancreatitis -Start amlodipine  5 mg daily  [TY]  1433 CBC with Differential(!) No leukocytosis to suggest systemic  infection.  No anemia [TY]  1434 Basic metabolic panel(!) Mildly low potassium.  Will replete. [TY]    Clinical Course User Index [TY] Neysa Caron PARAS, DO                                 Medical Decision Making 63 year old male presenting emergency department with left leg swelling and redness.  He is afebrile nontachycardic, normotensive.  Exam with likely cellulitis, but does have some swelling compared to the right.  Basic labs reassuring as noted in ED course.  Will get ultrasound to evaluate for DVT.  Care signed out to afternoon team disposition pending ultrasound.  Amount and/or Complexity of Data Reviewed Independent Historian:     Details: Family member notes no symptoms 2 days ago while he was in the hospital. External Data Reviewed:     Details: See ED course Labs: ordered. Decision-making details documented in ED Course. Radiology: ordered and independent interpretation performed.    Details: Do not appreciate obvious osseous abnormalities on x-rays.  Risk Prescription drug management. Decision regarding hospitalization. Diagnosis or treatment significantly limited by social determinants of health. Risk Details: Alcohol abuse; heavy use      Final diagnoses:  None    ED Discharge Orders     None          Neysa Caron PARAS, DO 08/30/23 1533

## 2023-08-30 NOTE — Discharge Instructions (Addendum)
 You were seen in the ER for leg swelling.  Our workup here shows that you have infection of the skin overlying your left leg and also a blood clot.  Start taking the blood thinner that is prescribed.  Start taking the antibiotics that is prescribed for the infection.  Call your PCP and follow-up within 2 weeks.  As discussed, Xarelto , is a blood thinner, and if you bleed, this medication will prevent your body from stopping the bleed easily.  Therefore avoid any activity that would increase the risk for significant traumatic brain injury.  This includes refraining from alcohol use in your case.   Return to the ER if you start having any bleeding, worsening of your existing symptoms.   Information on my medicine - XARELTO  (rivaroxaban )  This medication education was reviewed with me or my healthcare representative as part of my discharge preparation.  The pharmacist that spoke with me during my hospital stay was:  Evart Mcdonnell A, RPH  WHY WAS XARELTO  PRESCRIBED FOR YOU? Xarelto  was prescribed to treat blood clots that may have been found in the veins of your legs (deep vein thrombosis) or in your lungs (pulmonary embolism) and to reduce the risk of them occurring again.  What do you need to know about Xarelto ? The starting dose is one 15 mg tablet taken TWICE daily with food for the FIRST 21 DAYS then on 09/20/25 (or as specified by your starter kit  the dose is changed to one 20 mg tablet taken ONCE A DAY with your evening meal.  DO NOT stop taking Xarelto  without talking to the health care provider who prescribed the medication.  Refill your prescription for 20 mg tablets before you run out.  After discharge, you should have regular check-up appointments with your healthcare provider that is prescribing your Xarelto .  In the future your dose may need to be changed if your kidney function changes by a significant amount.  What do you do if you miss a dose? If you are taking Xarelto   TWICE DAILY and you miss a dose, take it as soon as you remember. You may take two 15 mg tablets (total 30 mg) at the same time then resume your regularly scheduled 15 mg twice daily the next day.  If you are taking Xarelto  ONCE DAILY and you miss a dose, take it as soon as you remember on the same day then continue your regularly scheduled once daily regimen the next day. Do not take two doses of Xarelto  at the same time.   Important Safety Information Xarelto  is a blood thinner medicine that can cause bleeding. You should call your healthcare provider right away if you experience any of the following: Bleeding from an injury or your nose that does not stop. Unusual colored urine (red or dark brown) or unusual colored stools (red or black). Unusual bruising for unknown reasons. A serious fall or if you hit your head (even if there is no bleeding).  Some medicines may interact with Xarelto  and might increase your risk of bleeding while on Xarelto . To help avoid this, consult your healthcare provider or pharmacist prior to using any new prescription or non-prescription medications, including herbals, vitamins, non-steroidal anti-inflammatory drugs (NSAIDs) and supplements.  This website has more information on Xarelto : www.xarelto .com.

## 2023-08-30 NOTE — Progress Notes (Signed)
 VASCULAR LAB    Left lower extremity venous duplex has been performed.  See CV proc for preliminary results.  Gave verbal report to Dr. Charlyn.   Shmiel Morton, RVT 08/30/2023, 5:34 PM

## 2023-08-30 NOTE — ED Notes (Signed)
 US  complete. Moved back to h/w. Talking on phone. Alert, NAD, calm, interactive.

## 2023-09-02 ENCOUNTER — Telehealth: Payer: Self-pay

## 2023-09-02 NOTE — Transitions of Care (Post Inpatient/ED Visit) (Signed)
   09/02/2023  Name: James [name] MRN: [id number] DOB: 07/21/1960  Today's TOC FU Call Status:   Patient's Name and Date of Birth confirmed.  Transition Care Management Follow-up Telephone Call Date of Discharge: 08/30/23 Discharge Facility: [facility]) Type of Discharge: Emergency Department Reason for ED Visit: Other: How have you been since you were released from the hospital?: Better Any questions or concerns?: No  Items Reviewed: Medications obtained,verified, and reconciled?: Yes (Medications Reviewed) Any new allergies since your discharge?: No Dietary orders reviewed?: NA Do you have support at home?: Yes People in Home [RPT]: parent(s)  Medications Reviewed Today: Medications Reviewed Today     Reviewed by Starlene Charlynn BIRCH, CMA (Certified Medical Assistant) on 09/02/23 at [phone]  Med List Status: <None>   Medication Order Taking? Sig Documenting Provider Last Dose Status Informant  acetaminophen  (TYLENOL  8 HOUR) 650 MG CR tablet [id number] Yes Take 1 tablet (650 mg total) by mouth every 8 (eight) hours as needed for pain or fever. Nanavati, Ankit, MD  Active   allopurinol  (ZYLOPRIM ) 100 MG tablet [id number] Yes Take 1 tablet (100 mg total) by mouth daily. Oley Bascom RAMAN, NP  Active Self, Pharmacy Records  amLODipine  (NORVASC ) 5 MG tablet [id number] Yes Take 1 tablet (5 mg total) by mouth daily. Drusilla [name], MD  Active   cephALEXin  (KEFLEX ) 500 MG capsule [id number] Yes Take 1 capsule (500 mg total) by mouth 4 (four) times daily. Charlyn Sora, MD  Active   lipase/protease/amylase (CREON ) 12000-38000 units CPEP capsule [id number] Yes Take 1 capsule (12,000 Units total) by mouth 3 (three) times daily before meals. Drusilla [name], MD  Active   oxyCODONE  (OXY IR/ROXICODONE ) 5 MG immediate release tablet [id number] Yes Take 1 tablet (5 mg total) by mouth every 6 (six) hours as needed for moderate pain (pain score 4-6) or severe pain (pain score 7-10). Drusilla [name], MD   Active   pantoprazole  (PROTONIX ) 40 MG tablet [id number] Yes Take 1 tablet (40 mg total) by mouth daily. Drusilla [name], MD  Active   RIVAROXABAN  (XARELTO ) VTE STARTER PACK (15 & 20 MG) [id number] Yes Follow package directions: Take one 15mg  tablet by mouth twice a day. On day 22, switch to one 20mg  tablet once a day. Take with food. Charlyn Sora, MD  Active   thiamine  (VITAMIN B-1) 100 MG tablet [id number] Yes Take 1 tablet (100 mg total) by mouth daily. Drusilla [name], MD  Active             Home Care and Equipment/Supplies: Were Home Health Services Ordered?: NA Any new equipment or medical supplies ordered?: NA  Functional Questionnaire: Do you need assistance with bathing/showering or dressing?: No Do you need assistance with meal preparation?: No Do you need assistance with eating?: No Do you have difficulty maintaining continence: No Do you need assistance with getting out of bed/getting out of a chair/moving?: No Do you have difficulty managing or taking your medications?: No  Follow up appointments reviewed: Specialist Hospital Follow-up appointment confirmed?: NA Do you need transportation to your follow-up appointment?: No Do you understand care options if your condition(s) worsen?: Yes-patient verbalized understanding    SIGNATURE Fitzpatrick Alberico, RMA

## 2023-09-12 ENCOUNTER — Other Ambulatory Visit (HOSPITAL_COMMUNITY): Payer: Self-pay

## 2023-09-12 ENCOUNTER — Other Ambulatory Visit: Payer: Self-pay

## 2023-09-12 ENCOUNTER — Encounter: Payer: Self-pay | Admitting: Nurse Practitioner

## 2023-09-12 ENCOUNTER — Ambulatory Visit (INDEPENDENT_AMBULATORY_CARE_PROVIDER_SITE_OTHER): Payer: Self-pay | Admitting: Nurse Practitioner

## 2023-09-12 VITALS — BP 114/69 | HR 64 | Temp 98.4°F | Wt 198.4 lb

## 2023-09-12 DIAGNOSIS — K852 Alcohol induced acute pancreatitis without necrosis or infection: Secondary | ICD-10-CM

## 2023-09-12 MED ORDER — PANCRELIPASE (LIP-PROT-AMYL) 12000-38000 UNITS PO CPEP
12000.0000 [IU] | ORAL_CAPSULE | Freq: Three times a day (TID) | ORAL | 1 refills | Status: DC
  Filled 2023-09-12 – 2023-09-18 (×2): qty 100, 34d supply, fill #0

## 2023-09-12 MED ORDER — PANTOPRAZOLE SODIUM 40 MG PO TBEC
40.0000 mg | DELAYED_RELEASE_TABLET | Freq: Every day | ORAL | 1 refills | Status: DC
  Filled 2023-09-12 – 2023-09-18 (×2): qty 30, 30d supply, fill #0
  Filled 2023-10-22: qty 30, 30d supply, fill #1

## 2023-09-12 NOTE — Telephone Encounter (Signed)
 Copied from CRM [id number]. Topic: Clinical - Prescription Issue >> Sep 12, 2023  5:04 PM Winona R wrote:  Pt states the Dr was suppose to send in his medication for cephALEXin  (KEFLEX ) 500 MG capsule [[id number]], and oxyCODONE  (OXY IR/ROXICODONE ) 5 MG immediate release tablet [[id number]]. Contacted the pharmacy and there is no refill there for him

## 2023-09-12 NOTE — Progress Notes (Signed)
 Subjective   Patient ID: James [name], male    DOB: 10-24-1960, 63 y.o.   MRN: [id number]  Chief Complaint  Patient presents with   Hospitalization Follow-up    Referring provider: Oley Bascom RAMAN, NP  EATON FOLMAR is a 63 y.o. male with Past Medical History: No date: Arthritis     Comment:  right knee right wrist  No date: Chronic kidney disease No date: GERD (gastroesophageal reflux disease) No date: Hypertension     Comment:  pt states was treated with medication back in 2000;               currently on no medication  No date: Left renal mass dx'd 48yrs ago age 44: Melanoma (HCC)     Comment:  rt lat chest wall--surg only No date: Pancreatitis dx'd 01/2015: Renal cancer (HCC)     Comment:  left nephrectomy No date: Wears glasses   HPI  Patient presents today for hospital follow-up.  He was recently in the hospital for pancreatitis from alcohol use.  We discussed rehab options and he refuses any at this time.  He does have a history of alcohol and polysubstance abuse.  He does appear under the influence in office today.  We will refill medications for him.  He refuses any labs today. Denies f/c/s, n/v/d, hemoptysis, PND, leg swelling Denies chest pain or edema     Allergies  Allergen Reactions   Sulfa Antibiotics Swelling    Immunization History  Administered Date(s) Administered   Influenza,inj,Quad PF,6+ Mos 12/15/2015   Pneumococcal Polysaccharide-23 12/15/2015   Tdap 09/21/2011    Tobacco History: Social History   Tobacco Use  Smoking Status Every Day   Current packs/day: 0.33   Average packs/day: 0.3 packs/day for 20.0 years (6.6 ttl pk-yrs)   Types: Cigarettes  Smokeless Tobacco Never   Ready to quit: Not Answered Counseling given: Yes   Outpatient Encounter Medications as of 09/12/2023  Medication Sig   acetaminophen  (TYLENOL  8 HOUR) 650 MG CR tablet Take 1 tablet (650 mg total) by mouth every 8 (eight) hours as needed for pain or  fever.   allopurinol  (ZYLOPRIM ) 100 MG tablet Take 1 tablet (100 mg total) by mouth daily.   amLODipine  (NORVASC ) 5 MG tablet Take 1 tablet (5 mg total) by mouth daily.   cephALEXin  (KEFLEX ) 500 MG capsule Take 1 capsule (500 mg total) by mouth 4 (four) times daily.   oxyCODONE  (OXY IR/ROXICODONE ) 5 MG immediate release tablet Take 1 tablet (5 mg total) by mouth every 6 (six) hours as needed for moderate pain (pain score 4-6) or severe pain (pain score 7-10).   RIVAROXABAN  (XARELTO ) VTE STARTER PACK (15 & 20 MG) Follow package directions: Take one 15mg  tablet by mouth twice a day. On day 22, switch to one 20mg  tablet once a day. Take with food.   thiamine  (VITAMIN B-1) 100 MG tablet Take 1 tablet (100 mg total) by mouth daily.   [DISCONTINUED] lipase/protease/amylase (CREON ) 12000-38000 units CPEP capsule Take 1 capsule (12,000 Units total) by mouth 3 (three) times daily before meals.   [DISCONTINUED] pantoprazole  (PROTONIX ) 40 MG tablet Take 1 tablet (40 mg total) by mouth daily.   lipase/protease/amylase (CREON ) 12000-38000 units CPEP capsule Take 1 capsule (12,000 Units total) by mouth 3 (three) times daily before meals.   pantoprazole  (PROTONIX ) 40 MG tablet Take 1 tablet (40 mg total) by mouth daily.   No facility-administered encounter medications on file as of 09/12/2023.  Review of Systems  Review of Systems  Constitutional: Negative.   HENT: Negative.    Cardiovascular: Negative.   Gastrointestinal: Negative.   Allergic/Immunologic: Negative.   Neurological: Negative.   Psychiatric/Behavioral: Negative.       Objective:   BP 114/69   Pulse 64   Temp 98.4 F (36.9 C) (Oral)   Wt 198 lb 6.4 oz (90 kg)   SpO2 99%   BMI 25.47 kg/m   Wt Readings from Last 5 Encounters:  09/12/23 198 lb 6.4 oz (90 kg)  08/30/23 208 lb (94.3 kg)  08/25/23 208 lb (94.3 kg)  07/28/23 208 lb 12.8 oz (94.7 kg)  06/30/23 209 lb (94.8 kg)     Physical Exam Vitals and nursing note  reviewed.  Constitutional:      General: He is not in acute distress.    Appearance: He is well-developed.  Cardiovascular:     Rate and Rhythm: Normal rate and regular rhythm.  Pulmonary:     Effort: Pulmonary effort is normal.     Breath sounds: Normal breath sounds.  Skin:    General: Skin is warm and dry.  Neurological:     Mental Status: He is alert and oriented to person, place, and time.       Assessment & Plan:   Alcohol-induced acute pancreatitis without infection or necrosis -     Ambulatory referral to Gastroenterology  Other orders -     Pantoprazole  Sodium; Take 1 tablet (40 mg total) by mouth daily.  Dispense: 30 tablet; Refill: 1 -     Pancrelipase  (Lip-Prot-Amyl); Take 1 capsule (12,000 Units total) by mouth 3 (three) times daily before meals.  Dispense: 90 capsule; Refill: 1     Return if symptoms worsen or fail to improve.   Bascom GORMAN Borer, NP 09/12/2023

## 2023-09-15 NOTE — Telephone Encounter (Signed)
 Please advise [facility]

## 2023-09-17 ENCOUNTER — Encounter (HOSPITAL_BASED_OUTPATIENT_CLINIC_OR_DEPARTMENT_OTHER): Payer: Self-pay

## 2023-09-18 ENCOUNTER — Other Ambulatory Visit (HOSPITAL_COMMUNITY): Payer: Self-pay

## 2023-09-18 ENCOUNTER — Encounter: Payer: Self-pay | Admitting: Nurse Practitioner

## 2023-09-18 NOTE — Telephone Encounter (Signed)
 Copied from CRM [id number]. Topic: Clinical - Prescription Issue >> Sep 12, 2023  5:04 PM Winona R wrote:  Pt states the Dr was suppose to send in his medication for cephALEXin  (KEFLEX ) 500 MG capsule [[id number]], and oxyCODONE  (OXY IR/ROXICODONE ) 5 MG immediate release tablet [[id number]]. Contacted the pharmacy and there is no refill there for him This encounter was created in error - please disregard.

## 2023-09-18 NOTE — Telephone Encounter (Signed)
 This encounter was created in error - please disregard.

## 2023-10-01 ENCOUNTER — Encounter: Payer: Self-pay | Admitting: Nurse Practitioner

## 2023-10-01 ENCOUNTER — Ambulatory Visit (HOSPITAL_COMMUNITY)
Admission: RE | Admit: 2023-10-01 | Discharge: 2023-10-01 | Disposition: A | Discharge status: Home / Self Care | Source: Ambulatory Visit | Attending: Nurse Practitioner | Admitting: Nurse Practitioner

## 2023-10-01 ENCOUNTER — Other Ambulatory Visit (HOSPITAL_COMMUNITY): Payer: Self-pay

## 2023-10-01 ENCOUNTER — Ambulatory Visit (INDEPENDENT_AMBULATORY_CARE_PROVIDER_SITE_OTHER): Admitting: Nurse Practitioner

## 2023-10-01 VITALS — BP 130/70 | HR 95 | Temp 97.3°F | Wt 201.0 lb

## 2023-10-01 DIAGNOSIS — M25831 Other specified joint disorders, right wrist: Secondary | ICD-10-CM | POA: Insufficient documentation

## 2023-10-01 DIAGNOSIS — G4700 Insomnia, unspecified: Secondary | ICD-10-CM

## 2023-10-01 DIAGNOSIS — M79631 Pain in right forearm: Secondary | ICD-10-CM | POA: Insufficient documentation

## 2023-10-01 DIAGNOSIS — W19XXXA Unspecified fall, initial encounter: Secondary | ICD-10-CM | POA: Insufficient documentation

## 2023-10-01 MED ORDER — HYDROXYZINE PAMOATE 25 MG PO CAPS
25.0000 mg | ORAL_CAPSULE | Freq: Every evening | ORAL | 0 refills | Status: DC | PRN
  Filled 2023-10-01: qty 30, 30d supply, fill #0

## 2023-10-01 MED ORDER — RIVAROXABAN 20 MG PO TABS
20.0000 mg | ORAL_TABLET | Freq: Every day | ORAL | 2 refills | Status: DC
  Filled 2023-10-01: qty 30, 30d supply, fill #0

## 2023-10-01 NOTE — Progress Notes (Signed)
   Subjective   Patient ID: James [name], male    DOB: 09-Jan-1961, 63 y.o.   MRN: [id number]  Chief Complaint  Patient presents with   Fall    Referring provider: Oley Bascom RAMAN, NP  James [name] is a 63 y.o. male with Past Medical History: No date: Arthritis     Comment:  right knee right wrist  No date: Chronic kidney disease No date: GERD (gastroesophageal reflux disease) No date: Hypertension     Comment:  pt states was treated with medication back in 2000;               currently on no medication  No date: Left renal mass dx'd 39yrs ago age 60: Melanoma (HCC)     Comment:  rt lat chest wall--surg only No date: Pancreatitis dx'd 01/2015: Renal cancer (HCC)     Comment:  left nephrectomy No date: Wears glasses  HPI: HPI  Allergies  Allergen Reactions   Sulfa Antibiotics Swelling    Immunization History  Administered Date(s) Administered   Influenza,inj,Quad PF,6+ Mos 12/15/2015   Pneumococcal Polysaccharide-23 12/15/2015   Tdap 09/21/2011    Tobacco History: Social History   Tobacco Use  Smoking Status Every Day   Current packs/day: 0.33   Average packs/day: 0.3 packs/day for 20.0 years (6.6 ttl pk-yrs)   Types: Cigarettes  Smokeless Tobacco Never   Ready to quit: Not Answered Counseling given: Not Answered   Outpatient Encounter Medications as of 10/01/2023  Medication Sig   acetaminophen  (TYLENOL  8 HOUR) 650 MG CR tablet Take 1 tablet (650 mg total) by mouth every 8 (eight) hours as needed for pain or fever.   allopurinol  (ZYLOPRIM ) 100 MG tablet Take 1 tablet (100 mg total) by mouth daily.   amLODipine  (NORVASC ) 5 MG tablet Take 1 tablet (5 mg total) by mouth daily.   lipase/protease/amylase (CREON ) 12000-38000 units CPEP capsule Take 1 capsule (12,000 Units total) by mouth 3 (three) times daily before meals.   pantoprazole  (PROTONIX ) 40 MG tablet Take 1 tablet (40 mg total) by mouth daily.   RIVAROXABAN  (XARELTO ) VTE STARTER PACK (15 & 20  MG) Follow package directions: Take one 15mg  tablet by mouth twice a day. On day 22, switch to one 20mg  tablet once a day. Take with food.   thiamine  (VITAMIN B-1) 100 MG tablet Take 1 tablet (100 mg total) by mouth daily.   cephALEXin  (KEFLEX ) 500 MG capsule Take 1 capsule (500 mg total) by mouth 4 (four) times daily.   oxyCODONE  (OXY IR/ROXICODONE ) 5 MG immediate release tablet Take 1 tablet (5 mg total) by mouth every 6 (six) hours as needed for moderate pain (pain score 4-6) or severe pain (pain score 7-10).   No facility-administered encounter medications on file as of 10/01/2023.    Review of Systems  Review of Systems   Objective:   Wt 201 lb (91.2 kg)   BMI 25.81 kg/m   Wt Readings from Last 5 Encounters:  10/01/23 201 lb (91.2 kg)  09/12/23 198 lb 6.4 oz (90 kg)  08/30/23 208 lb (94.3 kg)  08/25/23 208 lb (94.3 kg)  07/28/23 208 lb 12.8 oz (94.7 kg)     Physical Exam    Assessment & Plan:   There are no diagnoses linked to this encounter.   No follow-ups on file.   Suzen Shove, RMA 10/01/2023

## 2023-10-01 NOTE — Progress Notes (Signed)
 Subjective   Patient ID: James [name], male    DOB: 1960/10/10, 63 y.o.   MRN: [id number]  Chief Complaint  Patient presents with   Fall    Referring provider: Oley Bascom RAMAN, NP  James [name] is a 63 y.o. male with Past Medical History: No date: Arthritis     Comment:  right knee right wrist  No date: Chronic kidney disease No date: GERD (gastroesophageal reflux disease) No date: Hypertension     Comment:  pt states was treated with medication back in 2000;               currently on no medication  No date: Left renal mass dx'd 4yrs ago age 23: Melanoma (HCC)     Comment:  rt lat chest wall--surg only No date: Pancreatitis dx'd 01/2015: Renal cancer (HCC)     Comment:  left nephrectomy No date: Wears glasses   HPI  Patient presents today for an acute visit.  He states that he fell this morning.  He did injure his right forearm and does have a large hematoma.  He is on a blood thinner.  We will order x-ray.  He was advised to elevate his arm and use ice packs as needed this afternoon.  Patient is requesting something for sleep.  He has been having anxiety at night. Denies f/c/s, n/v/d, hemoptysis, PND, leg swelling Denies chest pain or edema    Allergies  Allergen Reactions   Sulfa Antibiotics Swelling    Immunization History  Administered Date(s) Administered   Influenza,inj,Quad PF,6+ Mos 12/15/2015   Pneumococcal Polysaccharide-23 12/15/2015   Tdap 09/21/2011    Tobacco History: Social History   Tobacco Use  Smoking Status Every Day   Current packs/day: 0.33   Average packs/day: 0.3 packs/day for 20.0 years (6.6 ttl pk-yrs)   Types: Cigarettes  Smokeless Tobacco Never   Ready to quit: Not Answered Counseling given: Not Answered   Outpatient Encounter Medications as of 10/01/2023  Medication Sig   acetaminophen  (TYLENOL  8 HOUR) 650 MG CR tablet Take 1 tablet (650 mg total) by mouth every 8 (eight) hours as needed for pain or fever.    allopurinol  (ZYLOPRIM ) 100 MG tablet Take 1 tablet (100 mg total) by mouth daily.   amLODipine  (NORVASC ) 5 MG tablet Take 1 tablet (5 mg total) by mouth daily.   hydrOXYzine  (VISTARIL ) 25 MG capsule Take 1 capsule (25 mg total) by mouth at bedtime as needed.   lipase/protease/amylase (CREON ) 12000-38000 units CPEP capsule Take 1 capsule (12,000 Units total) by mouth 3 (three) times daily before meals.   pantoprazole  (PROTONIX ) 40 MG tablet Take 1 tablet (40 mg total) by mouth daily.   rivaroxaban  (XARELTO ) 20 MG TABS tablet Take 1 tablet (20 mg total) by mouth daily with supper.   thiamine  (VITAMIN B-1) 100 MG tablet Take 1 tablet (100 mg total) by mouth daily.   [DISCONTINUED] RIVAROXABAN  (XARELTO ) VTE STARTER PACK (15 & 20 MG) Follow package directions: Take one 15mg  tablet by mouth twice a day. On day 22, switch to one 20mg  tablet once a day. Take with food.   cephALEXin  (KEFLEX ) 500 MG capsule Take 1 capsule (500 mg total) by mouth 4 (four) times daily.   oxyCODONE  (OXY IR/ROXICODONE ) 5 MG immediate release tablet Take 1 tablet (5 mg total) by mouth every 6 (six) hours as needed for moderate pain (pain score 4-6) or severe pain (pain score 7-10).   No facility-administered encounter medications on file as  of 10/01/2023.    Review of Systems  Review of Systems  Constitutional: Negative.   HENT: Negative.    Cardiovascular: Negative.   Gastrointestinal: Negative.   Allergic/Immunologic: Negative.   Neurological: Negative.   Psychiatric/Behavioral: Negative.       Objective:   BP 130/70   Pulse 95   Temp (!) 97.3 F (36.3 C)   Wt 201 lb (91.2 kg)   SpO2 98%   BMI 25.81 kg/m   Wt Readings from Last 5 Encounters:  10/01/23 201 lb (91.2 kg)  09/12/23 198 lb 6.4 oz (90 kg)  08/30/23 208 lb (94.3 kg)  08/25/23 208 lb (94.3 kg)  07/28/23 208 lb 12.8 oz (94.7 kg)     Physical Exam Vitals and nursing note reviewed.  Constitutional:      General: He is not in acute distress.     Appearance: He is well-developed.  Cardiovascular:     Rate and Rhythm: Normal rate and regular rhythm.  Pulmonary:     Effort: Pulmonary effort is normal.     Breath sounds: Normal breath sounds.  Musculoskeletal:     Right forearm: Swelling and tenderness present.       Arms:  Skin:    General: Skin is warm and dry.  Neurological:     Mental Status: He is alert and oriented to person, place, and time.       Assessment & Plan:   Fall, initial encounter -     DG Forearm Right  Right forearm pain -     DG Forearm Right  Insomnia, unspecified type -     hydrOXYzine  Pamoate; Take 1 capsule (25 mg total) by mouth at bedtime as needed.  Dispense: 30 capsule; Refill: 0  Other orders -     Rivaroxaban ; Take 1 tablet (20 mg total) by mouth daily with supper.  Dispense: 30 tablet; Refill: 2     Return if symptoms worsen or fail to improve.   Bascom GORMAN Borer, NP 10/01/2023

## 2023-10-02 ENCOUNTER — Ambulatory Visit: Payer: Self-pay | Admitting: Nurse Practitioner

## 2023-10-21 ENCOUNTER — Other Ambulatory Visit (HOSPITAL_COMMUNITY): Payer: Self-pay

## 2023-10-22 ENCOUNTER — Ambulatory Visit: Admitting: Physician Assistant

## 2023-10-22 ENCOUNTER — Ambulatory Visit: Payer: Self-pay

## 2023-10-22 ENCOUNTER — Encounter: Payer: Self-pay | Admitting: Physician Assistant

## 2023-10-22 ENCOUNTER — Other Ambulatory Visit (HOSPITAL_COMMUNITY): Payer: Self-pay

## 2023-10-22 VITALS — BP 112/66 | HR 63 | Ht 74.0 in | Wt 198.0 lb

## 2023-10-22 DIAGNOSIS — M79644 Pain in right finger(s): Secondary | ICD-10-CM

## 2023-10-22 DIAGNOSIS — M25531 Pain in right wrist: Secondary | ICD-10-CM

## 2023-10-22 DIAGNOSIS — M25521 Pain in right elbow: Secondary | ICD-10-CM

## 2023-10-22 NOTE — Telephone Encounter (Signed)
 FYI Only or Action Required?: FYI only for provider.  Patient was last seen in primary care on 10/01/2023 by Oley Bascom RAMAN, NP.  Called Nurse Triage reporting Hand Pain, left forearm swelling.  Symptoms began 3 weeks ago.  Interventions attempted: OTC medications: Ice blue gel and Tylenol .  Symptoms are: gradually worsening.  Triage Disposition: See HCP Within 4 Hours (Or PCP Triage)to Mobile Bus  Patient/caregiver understands and will follow disposition?: yes       Copied from CRM [id number]. Topic: Clinical - Red Word Triage >> Oct 22, 2023  9:23 AM Treva T wrote: [facility] that prompted transfer to Nurse Triage: Received call from patient, states he has increased right hand swelling, with pain, bruising and difficulty moving fingers.   Patient reports had a recent fall 3 weeks ago, and braced the fall with his hand. Reason for Disposition  [1] SEVERE pain (e.g., excruciating, unable to use hand at all) AND [2] not improved after 2 hours of pain medicine  Answer Assessment - Initial Assessment Questions 1. ONSET: When did the pain start?     3 weeks ago  2. LOCATION: Where is the pain located?     Right hand  3. PAIN: How bad is the pain? (Scale 1-10; or mild, moderate, severe)     9/10 4. WORK OR EXERCISE: Has there been any recent work or exercise that involved this part (i.e., hand or wrist) of the body?     fall 5. CAUSE: What do you think is causing the pain?     Caught from fall  6. AGGRAVATING FACTORS: What makes the pain worse? (e.g., using computer)     Using it  7. OTHER SYMPTOMS: Do you have any other symptoms? (e.g., fever, neck pain, numbness or tingling, rash, swelling)     Cannot move little finger, swelling up to the elbow  Protocols used: Hand Pain-A-AH

## 2023-10-22 NOTE — Progress Notes (Signed)
 Established Patient Office Visit  Subjective   Patient ID: James [name], male    DOB: 1960-07-04  Age: 63 y.o. MRN: [id number]  Chief Complaint  Patient presents with   Wrist Injury    Patient fell 3 weeks ago and hit right elbow. He has noticed within the last few days swelling around the elbow, stiffness in pinky and swelling in right wrist. Patient did have imaging after accident     Discussed the use of AI scribe software for clinical note transcription with the patient, who gave verbal consent to proceed.  History of Present Illness   James [name] is a 63 year old male with arthritis who presents with right elbow pain and swelling following a fall.  Three weeks ago, he fell and hit his right elbow, leading to swelling and a knot, which has decreased. He now has stiffness in his little finger, swelling in his wrist, and throbbing pain that limits his ability to grip objects or drive with his right hand. The pain and swelling have worsened over the past week, spreading from his little finger to his elbow and wrist. There is no numbness or tingling.  An x-ray showed no acute fracture of the radius or ulna but revealed advanced arthritis in the wrist and a possible soft tissue hematoma. He has been using blue gel and Tylenol  for pain relief and elevating the arm, but swelling persists.  He is on blood thinners for a previous DVT diagnosed in July 2025     Past Medical History:  Diagnosis Date   Arthritis    right knee right wrist    Chronic kidney disease    GERD (gastroesophageal reflux disease)    Hypertension    pt states was treated with medication back in 2000; currently on no medication    Left renal mass    Melanoma (HCC) dx'd 34yrs ago age 35   rt lat chest wall--surg only   Pancreatitis    Renal cancer (HCC) dx'd 01/2015   left nephrectomy   Wears glasses    Social History   Socioeconomic History   Marital status: Divorced    Spouse name: Not on file    Number of children: 3   Years of education: Not on file   Highest education level: Not on file  Occupational History   Occupation: Advice worker  Tobacco Use   Smoking status: Every Day    Current packs/day: 0.33    Average packs/day: 0.3 packs/day for 20.0 years (6.6 ttl pk-yrs)    Types: Cigarettes   Smokeless tobacco: Never  Vaping Use   Vaping status: Never Used  Substance and Sexual Activity   Alcohol use: Yes   Drug use: Yes    Types: Heroin, Cocaine   Sexual activity: Never  Other Topics Concern   Not on file  Social History Narrative   Not on file   Social Drivers of Health   Financial Resource Strain: Not on file  Food Insecurity: No Food Insecurity (08/26/2023)   Hunger Vital Sign    Worried About Running Out of Food in the Last Year: Never true    Ran Out of Food in the Last Year: Never true  Transportation Needs: No Transportation Needs (08/26/2023)   PRAPARE - Administrator, Civil Service (Medical): No    Lack of Transportation (Non-Medical): No  Physical Activity: Not on file  Stress: Not on file  Social Connections: Not on file  Intimate Partner  Violence: Not At Risk (08/26/2023)   Humiliation, Afraid, Rape, and Kick questionnaire    Fear of Current or Ex-Partner: No    Emotionally Abused: No    Physically Abused: No    Sexually Abused: No   Family History  Problem Relation Age of Onset   Lung cancer Mother        non-smoker   Emphysema Father        smoker   Colon cancer Neg Hx    Esophageal cancer Neg Hx    Rectal cancer Neg Hx    Stomach cancer Neg Hx    Allergies  Allergen Reactions   Sulfa Antibiotics Swelling    Review of Systems  Constitutional: Negative.   HENT: Negative.    Eyes: Negative.   Respiratory:  Negative for shortness of breath.   Cardiovascular:  Negative for chest pain.  Gastrointestinal: Negative.   Genitourinary: Negative.   Musculoskeletal:  Positive for joint pain and myalgias.  Skin: Negative.    Neurological: Negative.   Endo/Heme/Allergies: Negative.   Psychiatric/Behavioral: Negative.        Objective:     BP 112/66 (BP Location: Left Arm, Patient Position: Sitting, Cuff Size: Large)   Pulse 63   Ht 6' 2 (1.88 m)   Wt 198 lb (89.8 kg)   SpO2 95%   BMI 25.42 kg/m  BP Readings from Last 3 Encounters:  10/22/23 112/66  10/01/23 130/70  09/12/23 114/69   Wt Readings from Last 3 Encounters:  10/22/23 198 lb (89.8 kg)  10/01/23 201 lb (91.2 kg)  09/12/23 198 lb 6.4 oz (90 kg)    Physical Exam Vitals and nursing note reviewed.  Constitutional:      Appearance: Normal appearance.  HENT:     Head: Normocephalic and atraumatic.     Right Ear: External ear normal.     Left Ear: External ear normal.     Nose: Nose normal.     Mouth/Throat:     Mouth: Mucous membranes are moist.     Pharynx: Oropharynx is clear.  Eyes:     Extraocular Movements: Extraocular movements intact.     Conjunctiva/sclera: Conjunctivae normal.     Pupils: Pupils are equal, round, and reactive to light.  Cardiovascular:     Rate and Rhythm: Normal rate and regular rhythm.     Pulses: Normal pulses.     Heart sounds: Normal heart sounds.  Pulmonary:     Effort: Pulmonary effort is normal.     Breath sounds: Normal breath sounds.  Musculoskeletal:     Right elbow: Swelling present. Decreased range of motion. Tenderness present.     Left elbow: Normal.     Right wrist: Swelling present. Decreased range of motion.     Left wrist: Normal.     Right hand: Swelling present. Decreased range of motion. Decreased strength.     Left hand: Normal.     Cervical back: Normal range of motion and neck supple.     Comments: See photos   Skin:    General: Skin is warm and dry.  Neurological:     General: No focal deficit present.     Mental Status: He is alert and oriented to person, place, and time.  Psychiatric:        Mood and Affect: Mood normal.        Behavior: Behavior normal.         Thought Content: Thought content normal.  Judgment: Judgment normal.            Assessment & Plan:   Problem List Items Addressed This Visit   None Visit Diagnoses       Finger pain, right    -  Primary   Relevant Orders   Ambulatory referral to Orthopedic Surgery     Right wrist pain       Relevant Orders   Ambulatory referral to Orthopedic Surgery     Right elbow pain       Relevant Orders   Ambulatory referral to Orthopedic Surgery      Assessment and Plan Right upper extremity pain and swelling after fall with advanced right wrist osteoarthritis Persistent pain and swelling in the right upper extremity post-fall, with advanced wrist osteoarthritis. X-ray negative for fracture, possible soft tissue hematoma. Differential includes tendon injury. Blood thinners complicate anti-inflammatory treatment. - Refer to orthopedics for evaluation and management. - Advise ice application on affected area. - Instruct to keep arm elevated. Red flags given for prompt reevaluation  No AVS created, patient does not have access to MyChart, no printer access available on screening van.  Patient education given through teach back method   I have reviewed the patient's medical history (PMH, PSH, Social History, Family History, Medications, and allergies) , and have been updated if relevant. I spent 30 minutes reviewing chart and  face to face time with patient.   Return if symptoms worsen or fail to improve.    Kirk RAMAN Mayers, PA-C

## 2023-10-29 ENCOUNTER — Ambulatory Visit: Payer: Self-pay | Admitting: Nurse Practitioner

## 2023-11-11 ENCOUNTER — Other Ambulatory Visit: Payer: Self-pay

## 2023-11-11 ENCOUNTER — Other Ambulatory Visit (HOSPITAL_COMMUNITY): Payer: Self-pay

## 2023-11-11 ENCOUNTER — Ambulatory Visit (INDEPENDENT_AMBULATORY_CARE_PROVIDER_SITE_OTHER): Payer: Self-pay | Admitting: Nurse Practitioner

## 2023-11-11 ENCOUNTER — Encounter: Payer: Self-pay | Admitting: Nurse Practitioner

## 2023-11-11 VITALS — BP 126/70 | HR 63 | Temp 99.0°F | Wt 193.0 lb

## 2023-11-11 DIAGNOSIS — I82812 Embolism and thrombosis of superficial veins of left lower extremities: Secondary | ICD-10-CM | POA: Diagnosis not present

## 2023-11-11 DIAGNOSIS — J0101 Acute recurrent maxillary sinusitis: Secondary | ICD-10-CM

## 2023-11-11 MED ORDER — FLUTICASONE PROPIONATE 50 MCG/ACT NA SUSP
2.0000 | Freq: Every day | NASAL | 6 refills | Status: DC
  Filled 2023-11-11 (×2): qty 16, 30d supply, fill #0

## 2023-11-11 MED ORDER — PANCRELIPASE (LIP-PROT-AMYL) 12000-38000 UNITS PO CPEP
12000.0000 [IU] | ORAL_CAPSULE | Freq: Three times a day (TID) | ORAL | 1 refills | Status: DC
  Filled 2023-11-11: qty 100, 30d supply, fill #0
  Filled 2023-11-11: qty 90, 30d supply, fill #0

## 2023-11-11 MED ORDER — AZITHROMYCIN 250 MG PO TABS
ORAL_TABLET | ORAL | 0 refills | Status: AC
  Filled 2023-11-11: qty 6, 5d supply, fill #0

## 2023-11-11 NOTE — Progress Notes (Signed)
 Subjective   Patient ID: James [name], male    DOB: 11/09/1960, 63 y.o.   MRN: [id number]  Chief Complaint  Patient presents with   Medical Management of Chronic Issues    Referring provider: Paseda, Folashade R, FNP  James [name] is a 63 y.o. male with Past Medical History: No date: Arthritis     Comment:  right knee right wrist  No date: Chronic kidney disease No date: GERD (gastroesophageal reflux disease) No date: Hypertension     Comment:  pt states was treated with medication back in 2000;               currently on no medication  No date: Left renal mass dx'd 60yrs ago age 83: Melanoma (HCC)     Comment:  rt lat chest wall--surg only No date: Pancreatitis dx'd 01/2015: Renal cancer (HCC)     Comment:  left nephrectomy No date: Wears glasses   HPI   Patient presents today for follow-up visit.  Overall he is doing well.  He states that he did stop taking all of his medications recently.  Vital signs are stable in office today.  We will reorder Creon .  Patient is not on blood thinners that he was on for previous DVT.  He does have lower extremity edema but states that this has improved from previous.  We will order repeat ultrasound to left lower extremity. Denies f/c/s, n/v/d, hemoptysis, PND, leg swelling Denies chest pain or edema    Allergies  Allergen Reactions   Sulfa Antibiotics Swelling    Immunization History  Administered Date(s) Administered   Influenza,inj,Quad PF,6+ Mos 12/15/2015   Pneumococcal Polysaccharide-23 12/15/2015   Tdap 09/21/2011    Tobacco History: Social History   Tobacco Use  Smoking Status Every Day   Current packs/day: 0.33   Average packs/day: 0.3 packs/day for 20.0 years (6.6 ttl pk-yrs)   Types: Cigarettes  Smokeless Tobacco Never   Ready to quit: Not Answered Counseling given: Yes   Outpatient Encounter Medications as of 11/11/2023  Medication Sig   acetaminophen  (TYLENOL  8 HOUR) 650 MG CR tablet Take 1  tablet (650 mg total) by mouth every 8 (eight) hours as needed for pain or fever. (Patient not taking: Reported on 11/11/2023)   allopurinol  (ZYLOPRIM ) 100 MG tablet Take 1 tablet (100 mg total) by mouth daily. (Patient not taking: Reported on 11/11/2023)   amLODipine  (NORVASC ) 5 MG tablet Take 1 tablet (5 mg total) by mouth daily. (Patient not taking: Reported on 11/11/2023)   cephALEXin  (KEFLEX ) 500 MG capsule Take 1 capsule (500 mg total) by mouth 4 (four) times daily. (Patient not taking: Reported on 11/11/2023)   hydrOXYzine  (VISTARIL ) 25 MG capsule Take 1 capsule (25 mg total) by mouth at bedtime as needed. (Patient not taking: Reported on 11/11/2023)   lipase/protease/amylase (CREON ) 12000-38000 units CPEP capsule Take 1 capsule (12,000 Units total) by mouth 3 (three) times daily before meals.   oxyCODONE  (OXY IR/ROXICODONE ) 5 MG immediate release tablet Take 1 tablet (5 mg total) by mouth every 6 (six) hours as needed for moderate pain (pain score 4-6) or severe pain (pain score 7-10). (Patient not taking: Reported on 11/11/2023)   pantoprazole  (PROTONIX ) 40 MG tablet Take 1 tablet (40 mg total) by mouth daily. (Patient not taking: Reported on 11/11/2023)   rivaroxaban  (XARELTO ) 20 MG TABS tablet Take 1 tablet (20 mg total) by mouth daily with supper. (Patient not taking: Reported on 11/11/2023)   thiamine  (VITAMIN B-1) 100  MG tablet Take 1 tablet (100 mg total) by mouth daily. (Patient not taking: Reported on 11/11/2023)   [DISCONTINUED] lipase/protease/amylase (CREON ) 12000-38000 units CPEP capsule Take 1 capsule (12,000 Units total) by mouth 3 (three) times daily before meals. (Patient not taking: Reported on 11/11/2023)   No facility-administered encounter medications on file as of 11/11/2023.    Review of Systems  Review of Systems  Constitutional: Negative.   HENT: Negative.    Cardiovascular: Negative.   Gastrointestinal: Negative.   Allergic/Immunologic: Negative.   Neurological:  Negative.   Psychiatric/Behavioral: Negative.       Objective:   BP 126/70   Pulse 63   Temp 99 F (37.2 C) (Oral)   Wt 193 lb (87.5 kg)   SpO2 98%   BMI 24.78 kg/m   Wt Readings from Last 5 Encounters:  11/11/23 193 lb (87.5 kg)  10/22/23 198 lb (89.8 kg)  10/01/23 201 lb (91.2 kg)  09/12/23 198 lb 6.4 oz (90 kg)  08/30/23 208 lb (94.3 kg)     Physical Exam Vitals and nursing note reviewed.  Constitutional:      General: He is not in acute distress.    Appearance: He is well-developed.  Cardiovascular:     Rate and Rhythm: Normal rate and regular rhythm.  Pulmonary:     Effort: Pulmonary effort is normal.     Breath sounds: Normal breath sounds.  Skin:    General: Skin is warm and dry.  Neurological:     Mental Status: He is alert and oriented to person, place, and time.       Assessment & Plan:   Thrombosis of saphenous vein, left -     VAS US  LOWER EXTREMITY VENOUS (DVT); Future  Other orders -     Pancrelipase  (Lip-Prot-Amyl); Take 1 capsule (12,000 Units total) by mouth 3 (three) times daily before meals.  Dispense: 90 capsule; Refill: 1     Return in about 4 weeks (around 12/09/2023) for blood pressure.   James GORMAN Borer, NP 11/11/2023

## 2023-11-12 ENCOUNTER — Telehealth: Payer: Self-pay | Admitting: Nurse Practitioner

## 2023-11-12 NOTE — Telephone Encounter (Signed)
 Sent to [facility] team for urgent referral [facility]

## 2023-11-12 NOTE — Telephone Encounter (Signed)
 Copied from CRM [id number]. Topic: Referral - Prior Authorization Question >> Nov 12, 2023 11:48 AM Winona SAUNDERS wrote:  Arland from [facility] calling to request a prior auth for pt DVT study. Call back number. [phone] Lessie or Dorothy) I have expressed the time frame of 7- 10 business day for prior auth and Arland understood. Please inform the pt of this process as well.

## 2023-11-18 ENCOUNTER — Telehealth: Payer: Self-pay

## 2023-11-18 ENCOUNTER — Ambulatory Visit (HOSPITAL_COMMUNITY)
Admission: RE | Admit: 2023-11-18 | Discharge: 2023-11-18 | Disposition: A | Discharge status: Home / Self Care | Source: Ambulatory Visit | Attending: Nurse Practitioner | Admitting: Nurse Practitioner

## 2023-11-18 DIAGNOSIS — I82812 Embolism and thrombosis of superficial veins of left lower extremities: Secondary | ICD-10-CM | POA: Diagnosis present

## 2023-11-18 NOTE — Telephone Encounter (Signed)
 No DVT and calf veins not well visualized.  Per [facility] at [facility].   Suzen Shove   CMA II

## 2023-11-19 ENCOUNTER — Ambulatory Visit: Payer: Self-pay | Admitting: Nurse Practitioner

## 2023-11-19 ENCOUNTER — Ambulatory Visit: Admitting: Orthopedic Surgery

## 2023-11-20 NOTE — Telephone Encounter (Signed)
 At last appointment with you patient advised he wanted to be changed to you for his PCP. KH

## 2023-11-21 NOTE — Telephone Encounter (Signed)
 Called pt to advise of the change in his apt. No answer. LVM KH

## 2023-12-03 ENCOUNTER — Encounter: Payer: Self-pay | Admitting: Physician Assistant

## 2023-12-03 ENCOUNTER — Ambulatory Visit: Payer: Self-pay

## 2023-12-03 ENCOUNTER — Emergency Department (HOSPITAL_COMMUNITY)

## 2023-12-03 ENCOUNTER — Ambulatory Visit: Admitting: Physician Assistant

## 2023-12-03 ENCOUNTER — Encounter (HOSPITAL_COMMUNITY): Payer: Self-pay | Admitting: Emergency Medicine

## 2023-12-03 ENCOUNTER — Emergency Department (HOSPITAL_COMMUNITY)
Admission: EM | Admit: 2023-12-03 | Discharge: 2023-12-04 | Disposition: A | Discharge status: Home / Self Care | Attending: Emergency Medicine | Admitting: Emergency Medicine

## 2023-12-03 VITALS — BP 117/69 | HR 75 | Wt 194.0 lb

## 2023-12-03 DIAGNOSIS — M542 Cervicalgia: Secondary | ICD-10-CM | POA: Insufficient documentation

## 2023-12-03 DIAGNOSIS — M109 Gout, unspecified: Secondary | ICD-10-CM | POA: Diagnosis not present

## 2023-12-03 DIAGNOSIS — Z7901 Long term (current) use of anticoagulants: Secondary | ICD-10-CM | POA: Diagnosis not present

## 2023-12-03 DIAGNOSIS — M25471 Effusion, right ankle: Secondary | ICD-10-CM | POA: Diagnosis not present

## 2023-12-03 DIAGNOSIS — R519 Headache, unspecified: Secondary | ICD-10-CM

## 2023-12-03 LAB — CBC
HCT: 40.9 % (ref 39.0–52.0)
Hemoglobin: 13.4 g/dL (ref 13.0–17.0)
MCH: 29.6 pg (ref 26.0–34.0)
MCHC: 32.8 g/dL (ref 30.0–36.0)
MCV: 90.5 fL (ref 80.0–100.0)
Platelets: 251 K/uL (ref 150–400)
RBC: 4.52 MIL/uL (ref 4.22–5.81)
RDW: 13.1 % (ref 11.5–15.5)
WBC: 7.6 K/uL (ref 4.0–10.5)
nRBC: 0 % (ref 0.0–0.2)

## 2023-12-03 NOTE — Telephone Encounter (Signed)
 FYI Only or Action Required?: FYI only for provider.  Patient was last seen in primary care on 11/11/2023 by Oley Bascom RAMAN, NP.  Called Nurse Triage reporting Foot Swelling.  Symptoms began several days ago.  Interventions attempted: Rest, hydration, or home remedies.  Symptoms are: gradually worsening.  Triage Disposition: See HCP Within 4 Hours (Or PCP Triage)  Patient/caregiver understands and will follow disposition?: Yes  **Referred to Mobile Bus, as there is no appt. Availability; patient will go now**       Copied from CRM [id number]. Topic: Clinical - Red Word Triage >> Dec 03, 2023  2:30 PM Kevelyn M wrote: Red Word that prompted transfer to Nurse Triage: Right foot swelling for 3 days. Severe intermittent headaches with certain movements since yesterday. Reason for Disposition  SEVERE leg swelling (e.g., swelling extends above knee, entire leg is swollen, weeping fluid)  Answer Assessment - Initial Assessment Questions 1. ONSET: When did the swelling start? (e.g., minutes, hours, days)     X 3 days  2. LOCATION: What part of the leg is swollen?  Are both legs swollen or just one leg?     Right foot swelling   3. SEVERITY: How bad is the swelling? (e.g., localized; mild, moderate, severe)     Severe  4. REDNESS: Is there redness or signs of infection?     Redness noted  5. PAIN: Is the swelling painful to touch? If Yes, ask: How painful is it?   (Scale 1-10; mild, moderate or severe)     Yes, 10/10   6. FEVER: Do you have a fever? If Yes, ask: What is it, how was it measured, and when did it start?      No    7. CAUSE: What do you think is causing the leg swelling?     Unsure   8. MEDICAL HISTORY: Do you have a history of blood clots (e.g., DVT), cancer, heart failure, kidney disease, or liver failure?     Yes, checked x 2 weeks ago, and was clear per patient   9. RECURRENT SYMPTOM: Have you had leg swelling before? If Yes, ask:  When was the last time? What happened that time?     Yes, ongoing   10. OTHER SYMPTOMS: Do you have any other symptoms? (e.g., chest pain, difficulty breathing)  No   He reports a severe headache as well, referred to [facility] as there are no appts. Available at his primary office or surrounding offices.  Protocols used: Leg Swelling and Edema-A-AH

## 2023-12-03 NOTE — Patient Instructions (Signed)
 I do encourage you to present to the ED for prompt evaluation  Please follow up with Mobile Unit or your PCP for follow up   Kirk CANDIE Sage, PA-C Physician Assistant [facility] Medicine [email or web address]

## 2023-12-03 NOTE — Progress Notes (Unsigned)
 Established Patient Office Visit  Subjective   Patient ID: James [name], male    DOB: [birth year]  Age: 63 y.o. MRN: [id number]  Chief Complaint  Patient presents with   Leg Swelling    Right leg    Headache    If he moves his arm or head fast he get a bad headache   Discussed the use of AI scribe software for clinical note transcription with the patient, who gave verbal consent to proceed.  History of Present Illness  James [name] is a 63 year old male who presents with severe headache and foot swelling.  He experiences severe headaches that began yesterday, initially mild with a crick in his neck, intensifying significantly last night with movement. The headache worsens with movement and improves with stillness. He took 1500 mg of Tylenol  at once without much relief. He had no sleep last night due to the headache, waking hourly. He drove himself to the appointment, staying in the right-hand lane for safety. He does have someone with him  He has swelling in his left ankle / foot for three to four days, soaking it in cold water  without relief. He has a history of gout, previously treated with allopurinol  for elevated uric acid levels, but past medications were ineffective. The foot swelling is painful to touch and feels hot.   He has a past history of a blood clot and was on a blood thinner, which he stopped four months ago.    Past Medical History:  Diagnosis Date   Arthritis    right knee right wrist    Chronic kidney disease    GERD (gastroesophageal reflux disease)    Hypertension    pt states was treated with medication back in 2000; currently on no medication    Left renal mass    Melanoma (HCC) dx'd 16yrs ago age 31   rt lat chest wall--surg only   Pancreatitis    Renal cancer (HCC) dx'd 01/2015   left nephrectomy   Wears glasses    Social History   Socioeconomic History   Marital status: Divorced    Spouse name: Not on file   Number of children: 3    Years of education: Not on file   Highest education level: Not on file  Occupational History   Occupation: Advice worker  Tobacco Use   Smoking status: Every Day    Current packs/day: 0.33    Average packs/day: 0.3 packs/day for 20.0 years (6.6 ttl pk-yrs)    Types: Cigarettes   Smokeless tobacco: Never  Vaping Use   Vaping status: Never Used  Substance and Sexual Activity   Alcohol use: Yes   Drug use: Yes    Types: Heroin, Cocaine   Sexual activity: Never  Other Topics Concern   Not on file  Social History Narrative   Not on file   Social Drivers of Health   Financial Resource Strain: Not on file  Food Insecurity: No Food Insecurity (08/26/2023)   Hunger Vital Sign    Worried About Running Out of Food in the Last Year: Never true    Ran Out of Food in the Last Year: Never true  Transportation Needs: No Transportation Needs (08/26/2023)   PRAPARE - Administrator, Civil Service (Medical): No    Lack of Transportation (Non-Medical): No  Physical Activity: Not on file  Stress: Not on file  Social Connections: Not on file  Intimate Partner Violence: Not At Risk (08/26/2023)  Humiliation, Afraid, Rape, and Kick questionnaire    Fear of Current or Ex-Partner: No    Emotionally Abused: No    Physically Abused: No    Sexually Abused: No   Family History  Problem Relation Age of Onset   Lung cancer Mother        non-smoker   Emphysema Father        smoker   Colon cancer Neg Hx    Esophageal cancer Neg Hx    Rectal cancer Neg Hx    Stomach cancer Neg Hx    Allergies  Allergen Reactions   Sulfa Antibiotics Swelling    Review of Systems  Constitutional:  Negative for chills and fever.  HENT: Negative.    Eyes: Negative.   Respiratory:  Negative for shortness of breath.   Cardiovascular:  Negative for chest pain.  Gastrointestinal: Negative.   Genitourinary: Negative.   Musculoskeletal:  Positive for joint pain and neck pain.  Skin: Negative.    Neurological:  Positive for headaches.  Endo/Heme/Allergies: Negative.   Psychiatric/Behavioral: Negative.        Objective:     BP 117/69 (BP Location: Left Arm, Patient Position: Sitting, Cuff Size: Normal)   Pulse 75   Wt 194 lb (88 kg)   SpO2 98%   BMI 24.91 kg/m  BP Readings from Last 3 Encounters:  12/04/23 (!) 147/80  12/03/23 117/69  11/11/23 126/70   Wt Readings from Last 3 Encounters:  12/03/23 194 lb (88 kg)  11/11/23 193 lb (87.5 kg)  10/22/23 198 lb (89.8 kg)    Physical Exam Vitals and nursing note reviewed.    GENERAL: Alert, cooperative, well developed, no acute distress. HEENT: Normocephalic, normal oropharynx, moist mucous membranes. CHEST: Clear to auscultation bilaterally, no wheezes, rhonchi, or crackles. CARDIOVASCULAR: Normal heart rate and rhythm, S1 and S2 normal without murmurs. EXTREMITIES: Right foot swollen, tender, and warm to touch. No cyanosis or edema. See photo NEUROLOGICAL: Cranial nerves II-XII grossly intact, motor strength 5/5 in upper extremities, moves all extremities without gross motor or sensory deficit.    Assessment & Plan:   Problem List Items Addressed This Visit   None Visit Diagnoses       Sudden onset of severe headache    -  Primary      Assessment and Plan Acute severe headache Severe headache with neck cramp, concern for stroke versus muscle strain. - Immediate evaluation at emergency department for imaging and assessment. - Advise against driving; recommend ambulance transport. - Encourage follow-up post-emergency visit for further management.  Acute gout flare, right foot Swelling and pain in right foot suggestive of acute gout flare, history of elevated uric acid and prior allopurinol  use. - Due to patient presenting to ED, will defer treatment to ED   Patient declines transport by ambulance, does state he will have prior surgical clip given to him drive him to the emergency department.  Red flags  given.  Patient understands and agrees   I have reviewed the patient's medical history (PMH, PSH, Social History, Family History, Medications, and allergies) , and have been updated if relevant. I spent 20 minutes reviewing chart and  face to face time with patient.      Return if symptoms worsen or fail to improve.    Kirk RAMAN Mayers, PA-C

## 2023-12-03 NOTE — ED Provider Triage Note (Signed)
 Emergency Medicine Provider Triage Evaluation Note  James [name] , a 63 y.o. male  was evaluated in triage.  Pt complains of headache starting last night and progressively worse. R side, pulsitile, radiating from occipital region to forehead.   Endorses blurry vision secondary to tears from pain, otherwise only noting a light blur  in R eye.  Endorses light sensitivity.   Also noting some mild left arm and left leg weakness.  But has been able to ambulate without difficulty.  Denies vertigo, sound sensitivity.   Review of Systems  Positive: N/a Negative: N/a  Physical Exam  BP 120/72   Pulse 74   Temp 97.6 F (36.4 C) (Oral)   Resp 15   SpO2 95%  Gen:   Awake, no distress   Resp:  Normal effort  MSK:   Moves extremities without difficulty NIH negative Other:    Medical Decision Making  Medically screening exam initiated at 9:47 PM.  Appropriate orders placed.  James [name] was informed that the remainder of the evaluation will be completed by another provider, this initial triage assessment does not replace that evaluation, and the importance of remaining in the ED until their evaluation is complete.     Beola Terrall RAMAN, [place] 12/03/23 [id number]

## 2023-12-03 NOTE — ED Triage Notes (Signed)
 Pt comes in with severe headache, intermittent since last night.  Pt with severe neck pain as well and reports weakness to right arm and leg that began about lunch today.  NIH 0 at time of triage.  Pt has swelling to bilateral lower extremities that he reports is followed by a physician and he is taking fluid pills for same.  Pt reports he has stopped taking his BP medication last month.

## 2023-12-04 ENCOUNTER — Other Ambulatory Visit (HOSPITAL_COMMUNITY): Payer: Self-pay

## 2023-12-04 LAB — BASIC METABOLIC PANEL WITH GFR
Anion gap: 13 (ref 5–15)
BUN: 12 mg/dL (ref 8–23)
CO2: 24 mmol/L (ref 22–32)
Calcium: 9.5 mg/dL (ref 8.9–10.3)
Chloride: 101 mmol/L (ref 98–111)
Creatinine, Ser: 1.09 mg/dL (ref 0.61–1.24)
GFR, Estimated: >60 mL/min (ref >60)
Glucose, Bld: 111 mg/dL — ABNORMAL HIGH (ref 70–99)
Potassium: 3.6 mmol/L (ref 3.5–5.1)
Sodium: 137 mmol/L (ref 135–145)

## 2023-12-04 LAB — TROPONIN T, HIGH SENSITIVITY
Troponin T High Sensitivity: <15 ng/L (ref 0–19)
Troponin T High Sensitivity: <15 ng/L (ref 0–19)

## 2023-12-04 MED ORDER — IBUPROFEN 800 MG PO TABS
800.0000 mg | ORAL_TABLET | Freq: Once | ORAL | Status: AC
  Administered 2023-12-04: 800 mg via ORAL
  Filled 2023-12-04: qty 1

## 2023-12-04 MED ORDER — KETOROLAC TROMETHAMINE 15 MG/ML IJ SOLN
15.0000 mg | Freq: Once | INTRAMUSCULAR | Status: AC
  Administered 2023-12-04: 15 mg via INTRAVENOUS
  Filled 2023-12-04: qty 1

## 2023-12-04 MED ORDER — NAPROXEN 500 MG PO TABS
500.0000 mg | ORAL_TABLET | Freq: Two times a day (BID) | ORAL | 0 refills | Status: DC
  Filled 2023-12-04: qty 30, 15d supply, fill #0

## 2023-12-04 MED ORDER — LIDOCAINE 5 % EX PTCH
1.0000 | MEDICATED_PATCH | CUTANEOUS | 0 refills | Status: DC
  Filled 2023-12-04: qty 30, 30d supply, fill #0

## 2023-12-04 MED ORDER — CEPHALEXIN 500 MG PO CAPS
500.0000 mg | ORAL_CAPSULE | Freq: Four times a day (QID) | ORAL | 0 refills | Status: DC
Start: 2023-12-04 — End: 2023-12-10
  Filled 2023-12-04: qty 20, 5d supply, fill #0

## 2023-12-04 MED ORDER — LIDOCAINE 5 % EX PTCH
1.0000 | MEDICATED_PATCH | CUTANEOUS | Status: DC
  Administered 2023-12-04: 1 via TRANSDERMAL
  Filled 2023-12-04: qty 1

## 2023-12-04 MED ORDER — CEPHALEXIN 250 MG PO CAPS
250.0000 mg | ORAL_CAPSULE | Freq: Once | ORAL | Status: AC
  Administered 2023-12-04: 250 mg via ORAL
  Filled 2023-12-04: qty 1

## 2023-12-04 NOTE — ED Provider Notes (Signed)
 [place] EMERGENCY DEPARTMENT AT [facility] Provider Note   CSN: [id number] Arrival date & time: 12/03/23  2123     Patient presents with: Headache and Neck Pain   James [name] is a 63 y.o. male patient who presents to the emergency department today for further evaluation of right occipital headache that is been present since yesterday.  Patient states he was sleeping when this started.  It has been constant since onset.  It is worse with certain positional movements.  He denies any focal weakness or numbness.  Denies any obvious visual deficits.  Also denies any fever, chills, photophobia, phonophobia, history of headaches.    Headache Associated symptoms: neck pain   Neck Pain Associated symptoms: headaches        Prior to Admission medications   Medication Sig Start Date End Date Taking? Authorizing Provider  cephALEXin  (KEFLEX ) 500 MG capsule Take 1 capsule (500 mg total) by mouth 4 (four) times daily. 12/04/23  Yes Kyasia Steuck M, PA-C  lidocaine  (LIDODERM ) 5 % Place 1 patch onto the skin daily. Remove & Discard patch within 12 hours or as directed by MD 12/04/23  Yes Theotis, Faron Tudisco M, PA-C  naproxen  (NAPROSYN ) 500 MG tablet Take 1 tablet (500 mg total) by mouth 2 (two) times daily. 12/04/23  Yes Theotis, Armel Rabbani M, PA-C  acetaminophen  (TYLENOL  8 HOUR) 650 MG CR tablet Take 1 tablet (650 mg total) by mouth every 8 (eight) hours as needed for pain or fever. Patient not taking: Reported on 11/11/2023 08/30/23   Charlyn Sora, MD  allopurinol  (ZYLOPRIM ) 100 MG tablet Take 1 tablet (100 mg total) by mouth daily. Patient not taking: Reported on 11/11/2023 07/28/23   Oley Bascom RAMAN, NP  amLODipine  (NORVASC ) 5 MG tablet Take 1 tablet (5 mg total) by mouth daily. Patient not taking: Reported on 11/11/2023 08/28/23 08/27/24  Drusilla [name], MD  cephALEXin  (KEFLEX ) 500 MG capsule Take 1 capsule (500 mg total) by mouth 4 (four) times daily. Patient not taking: Reported on  11/11/2023 08/30/23   Charlyn Sora, MD  fluticasone  (FLONASE ) 50 MCG/ACT nasal spray Place 2 sprays into both nostrils daily. 11/11/23   Oley Bascom RAMAN, NP  hydrOXYzine  (VISTARIL ) 25 MG capsule Take 1 capsule (25 mg total) by mouth at bedtime as needed. Patient not taking: Reported on 11/11/2023 10/01/23   Nichols, Tonya S, NP  lipase/protease/amylase (CREON ) 12000-38000 units CPEP capsule Take 1 capsule (12,000 Units total) by mouth 3 (three) times daily before meals. 11/11/23   Oley Bascom RAMAN, NP  oxyCODONE  (OXY IR/ROXICODONE ) 5 MG immediate release tablet Take 1 tablet (5 mg total) by mouth every 6 (six) hours as needed for moderate pain (pain score 4-6) or severe pain (pain score 7-10). Patient not taking: Reported on 11/11/2023 08/28/23   Drusilla [name], MD  pantoprazole  (PROTONIX ) 40 MG tablet Take 1 tablet (40 mg total) by mouth daily. Patient not taking: Reported on 11/11/2023 09/12/23   Oley Bascom RAMAN, NP  rivaroxaban  (XARELTO ) 20 MG TABS tablet Take 1 tablet (20 mg total) by mouth daily with supper. Patient not taking: Reported on 11/11/2023 10/01/23   Oley Bascom RAMAN, NP  thiamine  (VITAMIN B-1) 100 MG tablet Take 1 tablet (100 mg total) by mouth daily. Patient not taking: Reported on 11/11/2023 08/29/23   Drusilla [name], MD    Allergies: Sulfa antibiotics    Review of Systems  Musculoskeletal:  Positive for neck pain.  Neurological:  Positive for headaches.  All other  systems reviewed and are negative.   Updated Vital Signs BP (!) 154/85   Pulse 75   Temp 98.1 F (36.7 C)   Resp 14   SpO2 97%   Physical Exam Vitals and nursing note reviewed.  Constitutional:      General: He is not in acute distress.    Appearance: Normal appearance.  HENT:     Head: Normocephalic and atraumatic.  Eyes:     General:        Right eye: No discharge.        Left eye: No discharge.  Neck:     Comments: No midline tenderness.  There is some palpable right paracervical musculature tenderness  which reproduces headache type pain. Cardiovascular:     Comments: Regular rate and rhythm.  S1/S2 are distinct without any evidence of murmur, rubs, or gallops.  Radial pulses are 2+ bilaterally.  Dorsalis pedis pulses are 2+ bilaterally.  No evidence of pedal edema. Pulmonary:     Comments: Clear to auscultation bilaterally.  Normal effort.  No respiratory distress.  No evidence of wheezes, rales, or rhonchi heard throughout. Abdominal:     General: Abdomen is flat. Bowel sounds are normal. There is no distension.     Tenderness: There is no abdominal tenderness. There is no guarding or rebound.  Musculoskeletal:        General: Normal range of motion.     Cervical back: Neck supple.  Skin:    General: Skin is warm and dry.     Findings: No rash.  Neurological:     General: No focal deficit present.     Mental Status: He is alert.     Comments: Cranial nerves II through XII are intact.  5/5 strength to the upper and lower extremities.  Normal sensation to the upper and lower extremities.  No dysmetria with finger-to-nose.  Answers all questions appropriately.  Psychiatric:        Mood and Affect: Mood normal.        Behavior: Behavior normal.     (all labs ordered are listed, but only abnormal results are displayed) Labs Reviewed  BASIC METABOLIC PANEL WITH GFR - Abnormal; Notable for the following components:      Result Value   Glucose, Bld 111 (*)    All other components within normal limits  CBC  TROPONIN T, HIGH SENSITIVITY  TROPONIN T, HIGH SENSITIVITY    EKG: EKG Interpretation Date/Time:  Thursday December 04 2023 01:43:29 EDT Ventricular Rate:  96 PR Interval:    QRS Duration:  76 QT Interval:  357 QTC Calculation: 452 R Axis:   25  Text Interpretation: Atrial fibrillation  When compared with ECG of 07/28/2017,  Atrial fibrillation has replaced Sinus rhythm  Confirmed by Raford Lenis ([id number]) on 12/04/2023 1:54:40 AM  Radiology: CT Head Wo Contrast Result  Date: 12/03/2023 CLINICAL DATA:  Headache, increasing frequency or severity; Neck trauma (Age >= 65y) EXAM: CT HEAD WITHOUT CONTRAST CT CERVICAL SPINE WITHOUT CONTRAST TECHNIQUE: Multidetector CT imaging of the head and cervical spine was performed following the standard protocol without intravenous contrast. Multiplanar CT image reconstructions of the cervical spine were also generated. RADIATION DOSE REDUCTION: This exam was performed according to the departmental dose-optimization program which includes automated exposure control, adjustment of the mA and/or kV according to patient size and/or use of iterative reconstruction technique. COMPARISON:  None Available. FINDINGS: CT HEAD FINDINGS Brain: Patchy and confluent areas of decreased attenuation are noted throughout the deep  and periventricular white matter of the cerebral hemispheres bilaterally, compatible with chronic microvascular ischemic disease. No evidence of large-territorial acute infarction. No parenchymal hemorrhage. No mass lesion. No extra-axial collection. No mass effect or midline shift. No hydrocephalus. Basilar cisterns are patent. Vascular: No hyperdense vessel. Atherosclerotic calcifications are present within the cavernous internal carotid arteries. Skull: No acute fracture or focal lesion. Sinuses/Orbits: Paranasal sinuses and mastoid air cells are clear. The orbits are unremarkable. Other: None. CT CERVICAL SPINE FINDINGS Alignment: Grade 1 anterolisthesis of C4 on C5 and C5 on C6. Skull base and vertebrae: Multilevel moderate degenerative changes spine. No severe osseous neural foraminal or central canal stenosis. No acute fracture. No aggressive appearing focal osseous lesion or focal pathologic process. Soft tissues and spinal canal: No prevertebral fluid or swelling. No visible canal hematoma. Upper chest: Centrilobular emphysematous changes. Other: Atherosclerotic plaque. IMPRESSION: 1. No acute intracranial abnormality. 2. No acute  displaced fracture or traumatic listhesis of the cervical spine. 3. Aortic Atherosclerosis (ICD10-I70.0) and Emphysema (ICD10-J43.9). Electronically Signed   By: Morgane  Naveau M.D.   On: 12/03/2023 23:08   CT Cervical Spine Wo Contrast Result Date: 12/03/2023 CLINICAL DATA:  Headache, increasing frequency or severity; Neck trauma (Age >= 65y) EXAM: CT HEAD WITHOUT CONTRAST CT CERVICAL SPINE WITHOUT CONTRAST TECHNIQUE: Multidetector CT imaging of the head and cervical spine was performed following the standard protocol without intravenous contrast. Multiplanar CT image reconstructions of the cervical spine were also generated. RADIATION DOSE REDUCTION: This exam was performed according to the departmental dose-optimization program which includes automated exposure control, adjustment of the mA and/or kV according to patient size and/or use of iterative reconstruction technique. COMPARISON:  None Available. FINDINGS: CT HEAD FINDINGS Brain: Patchy and confluent areas of decreased attenuation are noted throughout the deep and periventricular white matter of the cerebral hemispheres bilaterally, compatible with chronic microvascular ischemic disease. No evidence of large-territorial acute infarction. No parenchymal hemorrhage. No mass lesion. No extra-axial collection. No mass effect or midline shift. No hydrocephalus. Basilar cisterns are patent. Vascular: No hyperdense vessel. Atherosclerotic calcifications are present within the cavernous internal carotid arteries. Skull: No acute fracture or focal lesion. Sinuses/Orbits: Paranasal sinuses and mastoid air cells are clear. The orbits are unremarkable. Other: None. CT CERVICAL SPINE FINDINGS Alignment: Grade 1 anterolisthesis of C4 on C5 and C5 on C6. Skull base and vertebrae: Multilevel moderate degenerative changes spine. No severe osseous neural foraminal or central canal stenosis. No acute fracture. No aggressive appearing focal osseous lesion or focal  pathologic process. Soft tissues and spinal canal: No prevertebral fluid or swelling. No visible canal hematoma. Upper chest: Centrilobular emphysematous changes. Other: Atherosclerotic plaque. IMPRESSION: 1. No acute intracranial abnormality. 2. No acute displaced fracture or traumatic listhesis of the cervical spine. 3. Aortic Atherosclerosis (ICD10-I70.0) and Emphysema (ICD10-J43.9). Electronically Signed   By: Morgane  Naveau M.D.   On: 12/03/2023 23:08   DG Chest 2 View Result Date: 12/03/2023 CLINICAL DATA:  Weakness, headache, chest pain. EXAM: CHEST - 2 VIEW COMPARISON:  07/28/2017 FINDINGS: The cardiomediastinal contours are normal. The lungs are clear. Pulmonary vasculature is normal. No consolidation, pleural effusion, or pneumothorax. Degenerative change in the spine no acute osseous abnormalities are seen. IMPRESSION: No active cardiopulmonary disease. Electronically Signed   By: Andrea Gasman M.D.   On: 12/03/2023 22:08     Procedures   Medications Ordered in the ED  lidocaine  (LIDODERM ) 5 % 1 patch (1 patch Transdermal Patch Applied 12/04/23 0231)  ketorolac  (TORADOL ) 15 MG/ML injection 15 mg (  15 mg Intravenous Given 12/04/23 0231)    Clinical Course as of 12/04/23 0325  Thu Dec 04, 2023  0316 CBC Negative. [CF]  [id number] Basic metabolic panel(!) Negative. [CF]  0316 Troponin T, High Sensitivity Negative x2. [CF]  0316 CT Head Wo Contrast No findings of acute intracranial hemorrhage.  I do agree with radiologist interpretation. [CF]  0316 CT Cervical Spine Wo Contrast No findings of the spinal fracture.  I do agree with the radiologist interpretation. [CF]  0316 DG Chest 2 View No clinical signs of pneumonia.  I do agree with the radiologist interpretation. [CF]  [id number] On reevaluation, patient states his headache is slightly improved.  I again palpated in the right paracervical musculature does reproduce the patient's headache pain.  Patient also mentioned that his right ankle  has been swollen.  Does have a history of gout.  It is certainly erythematous and warm to palpation.  Questionable underlying cellulitis versus gout. Will likely treat with antibiotics and NSAIDs for both the muscular spasm and strain and possible gout flare.  Patient agreeable with plan.  He has an appointment with his primary care doctor on the 15th. [CF]    Clinical Course User Index [CF] Theotis Cameron HERO, PA-C    Medical Decision Making FLETCHER OSTERMILLER is a 63 y.o. male patient who presents to the emergency department today for further evaluation of neck pain.  Given clinical exam I have a low suspicion for any emergent pathology at this time.  This is likely just torticollis or muscular spasm on the right sided paracervical musculature.  Palpation does reproduce the patient's headache.  With regards to the patient's right ankle.  This could be gout versus cellulitis.  Patient has full range of motion in the joint and I have a low suspicion for septic joint this time.  Plus patient does not have a white count or fever.  Vital signs are otherwise normal and he is nonseptic appearing.  Rest of his labs are also normal.  CT imaging does not reveal any intracranial hemorrhage or cervical spinal pathology.  Will plan to treat with naproxen , lidocaine  patch, and Keflex .  Strict turn precautions were discussed.  He will follow-up with his primary care doctor for further evaluation on the 15th.  He will return to the emergency room sooner for any worsening symptoms.   Amount and/or Complexity of Data Reviewed Labs: ordered. Decision-making details documented in ED Course. Radiology: ordered. Decision-making details documented in ED Course.  Risk Prescription drug management.     Final diagnoses:  Neck pain  Right ankle swelling    ED Discharge Orders          Ordered    cephALEXin  (KEFLEX ) 500 MG capsule  4 times daily        12/04/23 0319    naproxen  (NAPROSYN ) 500 MG tablet  2 times  daily        12/04/23 0320    lidocaine  (LIDODERM ) 5 %  Every 24 hours        12/04/23 0324               Theotis Cameron HERO, PA-C 12/04/23 0325    Raford Lenis, MD 12/04/23 [phone]

## 2023-12-04 NOTE — Discharge Instructions (Signed)
 Please take antibiotics as prescribed and naproxen  as prescribed.  I would like for you to keep your appointment for the 15th.  Please return to the emergency department for any worsening symptoms.

## 2023-12-04 NOTE — ED Notes (Signed)
 Pt notified Clinical research associate when he came back in from his car. That he took a Tylenol . RN notified.

## 2023-12-05 ENCOUNTER — Encounter: Payer: Self-pay | Admitting: Physician Assistant

## 2023-12-05 NOTE — Telephone Encounter (Signed)
Noted. Pt has appointment scheduled.

## 2023-12-08 ENCOUNTER — Other Ambulatory Visit (HOSPITAL_COMMUNITY): Payer: Self-pay

## 2023-12-08 ENCOUNTER — Telehealth (HOSPITAL_COMMUNITY): Payer: Self-pay | Admitting: Pharmacy Technician

## 2023-12-08 NOTE — Telephone Encounter (Signed)
 Pharmacy Patient Advocate Encounter   Received notification from Inpatient Request that prior authorization for Lidocaine  5% Patches is required/requested.   Insurance verification completed.   The patient is insured through [facility] MEDICAID.   Per test claim: PA required; PA submitted to above mentioned insurance via Latent Key/confirmation #/EOC [id number] Status is pending

## 2023-12-09 ENCOUNTER — Ambulatory Visit: Payer: Self-pay | Admitting: Nurse Practitioner

## 2023-12-09 NOTE — Telephone Encounter (Signed)
 Pharmacy Patient Advocate Encounter  Received notification from [facility] MEDICAID that Prior Authorization for Lidocaine  5% Patches  has been DENIED.  Full denial letter will be uploaded to the media tab. See denial reason below.   PA #/Case ID/Reference #: [id number]

## 2023-12-10 ENCOUNTER — Encounter (INDEPENDENT_AMBULATORY_CARE_PROVIDER_SITE_OTHER): Payer: Self-pay | Admitting: Nurse Practitioner

## 2023-12-10 ENCOUNTER — Encounter: Payer: Self-pay | Admitting: Nurse Practitioner

## 2023-12-10 DIAGNOSIS — K219 Gastro-esophageal reflux disease without esophagitis: Secondary | ICD-10-CM | POA: Insufficient documentation

## 2023-12-25 ENCOUNTER — Encounter: Payer: Self-pay | Admitting: Nurse Practitioner

## 2023-12-25 ENCOUNTER — Ambulatory Visit (INDEPENDENT_AMBULATORY_CARE_PROVIDER_SITE_OTHER): Payer: Self-pay | Admitting: Nurse Practitioner

## 2023-12-25 ENCOUNTER — Other Ambulatory Visit (HOSPITAL_COMMUNITY): Payer: Self-pay

## 2023-12-25 VITALS — BP 139/82 | HR 74 | Wt 195.6 lb

## 2023-12-25 DIAGNOSIS — F419 Anxiety disorder, unspecified: Secondary | ICD-10-CM | POA: Diagnosis not present

## 2023-12-25 DIAGNOSIS — M1990 Unspecified osteoarthritis, unspecified site: Secondary | ICD-10-CM | POA: Diagnosis not present

## 2023-12-25 DIAGNOSIS — K5900 Constipation, unspecified: Secondary | ICD-10-CM | POA: Diagnosis not present

## 2023-12-25 DIAGNOSIS — L237 Allergic contact dermatitis due to plants, except food: Secondary | ICD-10-CM

## 2023-12-25 MED ORDER — PREDNISONE 10 MG PO TABS
ORAL_TABLET | ORAL | 0 refills | Status: AC
  Filled 2023-12-25: qty 20, 8d supply, fill #0

## 2023-12-25 MED ORDER — POLYETHYLENE GLYCOL 3350 17 GM/SCOOP PO POWD
17.0000 g | Freq: Two times a day (BID) | ORAL | 1 refills | Status: AC | PRN
  Filled 2023-12-25: qty 714, 21d supply, fill #0

## 2023-12-25 MED ORDER — TRIAMCINOLONE ACETONIDE 0.1 % EX CREA
1.0000 | TOPICAL_CREAM | Freq: Two times a day (BID) | CUTANEOUS | 0 refills | Status: AC
  Filled 2023-12-25: qty 30, 15d supply, fill #0

## 2023-12-25 MED ORDER — HYDROXYZINE PAMOATE 25 MG PO CAPS
25.0000 mg | ORAL_CAPSULE | Freq: Every evening | ORAL | 0 refills | Status: DC | PRN
  Filled 2023-12-25: qty 30, 30d supply, fill #0

## 2023-12-25 NOTE — Progress Notes (Signed)
 Subjective   Patient ID: James [name], male    DOB: 04/01/1960, 63 y.o.   MRN: [id number]  Chief Complaint  Patient presents with   Arthritis    Bilateral wrist   Rash    Face, arms , and neck, itches some, red, is a picker    Constipation    Has gone 4 days without voiding, has been taking laxatives and drinking water . Made one void and hasn't gone since     Referring provider: Oley James RAMAN, NP  James [name] is a 63 y.o. male with Past Medical History: No date: Arthritis     Comment:  right knee right wrist  No date: Chronic kidney disease No date: GERD (gastroesophageal reflux disease) No date: Hypertension     Comment:  pt states was treated with medication back in 2000;               currently on no medication  No date: Left renal mass dx'd 71yrs ago age 85: Melanoma (HCC)     Comment:  rt lat chest wall--surg only No date: Pancreatitis dx'd 01/2015: Renal cancer (HCC)     Comment:  left nephrectomy No date: Wears glasses    HPI  Patient presents today for follow-up visit.  Patient complains today of rash to face and arms.  He states that he was gathering when he was exposed to poison oak.  We will order prednisone  taper and Kenalog cream.  Patient also complains of arthritis to bilateral hands.  We discussed that the prednisone  will also help with arthritis pain and inflammation.  Patient also complains of recent constipation.  We will order MiraLAX . Denies f/c/s, n/v/d, hemoptysis, PND, leg swelling Denies chest pain or edema     Allergies  Allergen Reactions   Sulfa Antibiotics Swelling    Immunization History  Administered Date(s) Administered   Influenza,inj,Quad PF,6+ Mos 12/15/2015   Pneumococcal Polysaccharide-23 12/15/2015   Tdap 09/21/2011    Tobacco History: Social History   Tobacco Use  Smoking Status Every Day   Current packs/day: 0.33   Average packs/day: 0.3 packs/day for 20.0 years (6.6 ttl pk-yrs)   Types: Cigarettes   Smokeless Tobacco Never   Ready to quit: Not Answered Counseling given: Not Answered   Outpatient Encounter Medications as of 12/25/2023  Medication Sig   hydrOXYzine  (VISTARIL ) 25 MG capsule Take 1 capsule (25 mg total) by mouth at bedtime as needed for anxiety.   polyethylene glycol powder (GLYCOLAX /MIRALAX ) 17 GM/SCOOP powder Take 17 g by mouth 2 (two) times daily as needed. Dissolve 1 capful (17g) in 4-8 ounces of liquid and take by mouth daily.   predniSONE  (DELTASONE ) 10 MG tablet Take 4 tabs for 2 days, then 3 tabs for 2 days, then 2 tabs for 2 days, then 1 tab for 2 days, then stop   triamcinolone cream (KENALOG) 0.1 % Apply 1 Application topically 2 (two) times daily.   No facility-administered encounter medications on file as of 12/25/2023.    Review of Systems  Review of Systems  Constitutional: Negative.   HENT: Negative.    Cardiovascular: Negative.   Skin:  Positive for rash.  Allergic/Immunologic: Negative.   Neurological: Negative.   Psychiatric/Behavioral: Negative.       Objective:   BP 139/82 (BP Location: Right Arm, Patient Position: Sitting, Cuff Size: Large)   Pulse 74   Wt 195 lb 9.6 oz (88.7 kg)   SpO2 97%   BMI 25.11 kg/m   Wt  Readings from Last 5 Encounters:  12/25/23 195 lb 9.6 oz (88.7 kg)  12/10/23 194 lb (88 kg)  12/03/23 194 lb (88 kg)  11/11/23 193 lb (87.5 kg)  10/22/23 198 lb (89.8 kg)     Physical Exam Vitals and nursing note reviewed.  Constitutional:      General: He is not in acute distress.    Appearance: He is well-developed.  Cardiovascular:     Rate and Rhythm: Normal rate and regular rhythm.  Pulmonary:     Effort: Pulmonary effort is normal.     Breath sounds: Normal breath sounds.  Skin:    General: Skin is warm and dry.     Findings: Rash present.  Neurological:     Mental Status: He is alert and oriented to person, place, and time.       Assessment & Plan:   Constipation, unspecified constipation  type -     Polyethylene Glycol 3350 ; Take 17 g by mouth 2 (two) times daily as needed. Dissolve 1 capful (17g) in 4-8 ounces of liquid and take by mouth daily.  Dispense: 3350 g; Refill: 1  Poison oak -     predniSONE ; Take 4 tabs for 2 days, then 3 tabs for 2 days, then 2 tabs for 2 days, then 1 tab for 2 days, then stop  Dispense: 20 tablet; Refill: 0 -     Triamcinolone Acetonide; Apply 1 Application topically 2 (two) times daily.  Dispense: 30 g; Refill: 0  Arthritis -     predniSONE ; Take 4 tabs for 2 days, then 3 tabs for 2 days, then 2 tabs for 2 days, then 1 tab for 2 days, then stop  Dispense: 20 tablet; Refill: 0 -     Triamcinolone Acetonide; Apply 1 Application topically 2 (two) times daily.  Dispense: 30 g; Refill: 0  Anxiety -     hydrOXYzine  Pamoate; Take 1 capsule (25 mg total) by mouth at bedtime as needed for anxiety.  Dispense: 30 capsule; Refill: 0     Return in about 6 months (around 06/24/2024).   James GORMAN Borer, NP 12/25/2023

## 2023-12-29 ENCOUNTER — Encounter: Payer: Self-pay | Admitting: Radiology

## 2024-02-25 ENCOUNTER — Ambulatory Visit (INDEPENDENT_AMBULATORY_CARE_PROVIDER_SITE_OTHER): Payer: Self-pay | Admitting: Nurse Practitioner

## 2024-02-25 ENCOUNTER — Encounter: Payer: Self-pay | Admitting: Nurse Practitioner

## 2024-02-25 ENCOUNTER — Other Ambulatory Visit (HOSPITAL_COMMUNITY): Payer: Self-pay

## 2024-02-25 VITALS — BP 133/83 | HR 72 | Temp 97.1°F | Wt 192.0 lb

## 2024-02-25 DIAGNOSIS — K852 Alcohol induced acute pancreatitis without necrosis or infection: Secondary | ICD-10-CM

## 2024-02-25 DIAGNOSIS — Z13220 Encounter for screening for lipoid disorders: Secondary | ICD-10-CM

## 2024-02-25 DIAGNOSIS — R221 Localized swelling, mass and lump, neck: Secondary | ICD-10-CM

## 2024-02-25 DIAGNOSIS — M109 Gout, unspecified: Secondary | ICD-10-CM

## 2024-02-25 DIAGNOSIS — F419 Anxiety disorder, unspecified: Secondary | ICD-10-CM

## 2024-02-25 MED ORDER — HYDROXYZINE PAMOATE 25 MG PO CAPS
25.0000 mg | ORAL_CAPSULE | Freq: Every evening | ORAL | 0 refills | Status: AC | PRN
  Filled 2024-02-25 (×2): qty 30, 30d supply, fill #0

## 2024-02-25 MED ORDER — MELOXICAM 7.5 MG PO TABS
7.5000 mg | ORAL_TABLET | Freq: Every day | ORAL | 0 refills | Status: AC
  Filled 2024-02-25: qty 30, 30d supply, fill #0

## 2024-02-25 MED ORDER — ALLOPURINOL 100 MG PO TABS
100.0000 mg | ORAL_TABLET | Freq: Every day | ORAL | 6 refills | Status: AC
  Filled 2024-02-25 (×2): qty 30, 30d supply, fill #0

## 2024-02-25 NOTE — Progress Notes (Signed)
 "  Subjective   Patient ID: James [name], male    DOB: June 18, 1960, 63 y.o.   MRN: [id number]  Chief Complaint  Patient presents with   Follow-up    Has a knot of left side of neck that has been there a while. Need Gout medication for foot pain. Need to restart all medications.     Referring provider: Oley Bascom RAMAN, NP  Ozell LITTIE [name] is a 63 y.o. male with Past Medical History: No date: Arthritis     Comment:  right knee right wrist  No date: Chronic kidney disease No date: GERD (gastroesophageal reflux disease) No date: Hypertension     Comment:  pt states was treated with medication back in 2000;               currently on no medication  No date: Left renal mass dx'd 38yrs ago age 23: Melanoma (HCC)     Comment:  rt lat chest wall--surg only No date: Pancreatitis dx'd 01/2015: Renal cancer (HCC)     Comment:  left nephrectomy No date: Wears glasses  HPI  Patient presents today for follow-up visit.  He would like refills on allopurinol  for gout.  He states that he does continue to take Creon  for pancreatitis and does not need a refill at this time.  He does need a refill on hydroxyzine . Denies f/c/s, n/v/d, hemoptysis, PND, leg swelling Denies chest pain or edema     Allergies[1]  Immunization History  Administered Date(s) Administered   Influenza,inj,Quad PF,6+ Mos 12/15/2015   Pneumococcal Polysaccharide-23 12/15/2015   Tdap 09/21/2011    Tobacco History: Tobacco Use History[2] Ready to quit: Not Answered Counseling given: Not Answered   Outpatient Encounter Medications as of 02/25/2024  Medication Sig   allopurinol  (ZYLOPRIM ) 100 MG tablet Take 1 tablet (100 mg total) by mouth daily.   meloxicam (MOBIC) 7.5 MG tablet Take 1 tablet (7.5 mg total) by mouth daily.   hydrOXYzine  (VISTARIL ) 25 MG capsule Take 1 capsule (25 mg total) by mouth at bedtime as needed for anxiety.   polyethylene glycol powder (GLYCOLAX /MIRALAX ) 17 GM/SCOOP powder Dissolve 1  capful (17g) in 4-8 ounces of liquid and take by mouth twice daily as needed. (Patient not taking: Reported on 02/25/2024)   predniSONE  (DELTASONE ) 10 MG tablet Take 4 tablets by mouth daily for 2 days, then 3 tabs daily for 2 days, then 2 tabs daily for 2 days, then 1 tab daily for 2 days, then stop (Patient not taking: Reported on 02/25/2024)   triamcinolone  cream (KENALOG ) 0.1 % Apply 1 Application topically 2 (two) times daily. (Patient not taking: Reported on 02/25/2024)   [DISCONTINUED] hydrOXYzine  (VISTARIL ) 25 MG capsule Take 1 capsule (25 mg total) by mouth at bedtime as needed for anxiety. (Patient not taking: Reported on 02/25/2024)   No facility-administered encounter medications on file as of 02/25/2024.    Review of Systems  Review of Systems  Constitutional: Negative.   HENT: Negative.    Cardiovascular: Negative.   Gastrointestinal: Negative.   Allergic/Immunologic: Negative.   Neurological: Negative.   Psychiatric/Behavioral: Negative.       Objective:   BP 133/83 (BP Location: Left Arm, Patient Position: Sitting, Cuff Size: Normal)   Pulse 72   Temp (!) 97.1 F (36.2 C) (Temporal)   Wt 192 lb (87.1 kg)   SpO2 96%   BMI 24.65 kg/m   Wt Readings from Last 5 Encounters:  02/25/24 192 lb (87.1 kg)  12/25/23 195 lb 9.6  oz (88.7 kg)  12/10/23 194 lb (88 kg)  12/03/23 194 lb (88 kg)  11/11/23 193 lb (87.5 kg)     Physical Exam Vitals and nursing note reviewed.  Constitutional:      General: He is not in acute distress.    Appearance: He is well-developed.  Cardiovascular:     Rate and Rhythm: Normal rate and regular rhythm.  Pulmonary:     Effort: Pulmonary effort is normal.     Breath sounds: Normal breath sounds.  Skin:    General: Skin is warm and dry.  Neurological:     Mental Status: He is alert and oriented to person, place, and time.       Assessment & Plan:   Mass of left side of neck -     US  SOFT TISSUE HEAD & NECK  (NON-THYROID)  Anxiety -     hydrOXYzine  Pamoate; Take 1 capsule (25 mg total) by mouth at bedtime as needed for anxiety.  Dispense: 30 capsule; Refill: 0  Alcohol-induced acute pancreatitis without infection or necrosis -     CBC -     Comprehensive metabolic panel with GFR  Gout, unspecified cause, unspecified chronicity, unspecified site -     CBC -     Comprehensive metabolic panel with GFR  Lipid screening -     Lipid panel  Other orders -     Allopurinol ; Take 1 tablet (100 mg total) by mouth daily.  Dispense: 30 tablet; Refill: 6 -     Meloxicam; Take 1 tablet (7.5 mg total) by mouth daily.  Dispense: 30 tablet; Refill: 0     Return in about 3 months (around 05/25/2024).   Bascom GORMAN Borer, NP 02/25/2024     [1]  Allergies Allergen Reactions   Sulfa Antibiotics Swelling  [2]  Social History Tobacco Use  Smoking Status Every Day   Current packs/day: 0.33   Average packs/day: 0.3 packs/day for 20.0 years (6.6 ttl pk-yrs)   Types: Cigarettes  Smokeless Tobacco Never   "

## 2024-02-26 LAB — LIPID PANEL
Chol/HDL Ratio: 3.6 ratio (ref 0.0–5.0)
Cholesterol, Total: 159 mg/dL (ref 100–199)
HDL: 44 mg/dL
LDL Chol Calc (NIH): 101 mg/dL — ABNORMAL HIGH (ref 0–99)
Triglycerides: 69 mg/dL (ref 0–149)
VLDL Cholesterol Cal: 14 mg/dL (ref 5–40)

## 2024-02-26 LAB — COMPREHENSIVE METABOLIC PANEL WITH GFR
ALT: 11 IU/L (ref 0–44)
AST: 20 IU/L (ref 0–40)
Albumin: 4.6 g/dL (ref 3.9–4.9)
Alkaline Phosphatase: 83 IU/L (ref 47–123)
BUN/Creatinine Ratio: 9 — ABNORMAL LOW (ref 10–24)
BUN: 8 mg/dL (ref 8–27)
Bilirubin Total: 0.5 mg/dL (ref 0.0–1.2)
CO2: 21 mmol/L (ref 20–29)
Calcium: 9.5 mg/dL (ref 8.6–10.2)
Chloride: 105 mmol/L (ref 96–106)
Creatinine, Ser: 0.9 mg/dL (ref 0.76–1.27)
Globulin, Total: 2.6 g/dL (ref 1.5–4.5)
Glucose: 80 mg/dL (ref 70–99)
Potassium: 4.4 mmol/L (ref 3.5–5.2)
Sodium: 141 mmol/L (ref 134–144)
Total Protein: 7.2 g/dL (ref 6.0–8.5)
eGFR: 96 mL/min/1.73

## 2024-02-26 LAB — CBC
Hematocrit: 42.9 % (ref 37.5–51.0)
Hemoglobin: 14.6 g/dL (ref 13.0–17.7)
MCH: 29.9 pg (ref 26.6–33.0)
MCHC: 34 g/dL (ref 31.5–35.7)
MCV: 88 fL (ref 79–97)
Platelets: 276 x10E3/uL (ref 150–450)
RBC: 4.89 x10E6/uL (ref 4.14–5.80)
RDW: 13.9 % (ref 11.6–15.4)
WBC: 4.4 x10E3/uL (ref 3.4–10.8)

## 2024-02-27 ENCOUNTER — Ambulatory Visit: Payer: Self-pay | Admitting: Nurse Practitioner

## 2024-02-27 ENCOUNTER — Other Ambulatory Visit: Payer: Self-pay

## 2024-02-27 ENCOUNTER — Other Ambulatory Visit (HOSPITAL_COMMUNITY): Payer: Self-pay

## 2024-02-27 DIAGNOSIS — R221 Localized swelling, mass and lump, neck: Secondary | ICD-10-CM

## 2024-03-01 ENCOUNTER — Inpatient Hospital Stay: Admission: RE | Admit: 2024-03-01 | Discharge: 2024-03-01 | Attending: Nurse Practitioner

## 2024-03-02 ENCOUNTER — Other Ambulatory Visit (HOSPITAL_COMMUNITY): Payer: Self-pay

## 2024-05-26 ENCOUNTER — Ambulatory Visit: Admitting: Nurse Practitioner

## 2024-05-26 ENCOUNTER — Ambulatory Visit: Payer: Self-pay | Admitting: Nurse Practitioner
